# Patient Record
Sex: Male | Born: 1960
Health system: Southern US, Community
[De-identification: ages and names within clinical notes are randomized; demographics above are authoritative.]

## PROBLEM LIST (undated history)

## (undated) DIAGNOSIS — K219 Gastro-esophageal reflux disease without esophagitis: Secondary | ICD-10-CM

## (undated) DIAGNOSIS — R011 Cardiac murmur, unspecified: Secondary | ICD-10-CM

## (undated) DIAGNOSIS — E118 Type 2 diabetes mellitus with unspecified complications: Secondary | ICD-10-CM

## (undated) DIAGNOSIS — R0789 Other chest pain: Secondary | ICD-10-CM

## (undated) DIAGNOSIS — I2510 Atherosclerotic heart disease of native coronary artery without angina pectoris: Secondary | ICD-10-CM

## (undated) DIAGNOSIS — E669 Obesity, unspecified: Secondary | ICD-10-CM

## (undated) DIAGNOSIS — E119 Type 2 diabetes mellitus without complications: Secondary | ICD-10-CM

## (undated) DIAGNOSIS — E785 Hyperlipidemia, unspecified: Secondary | ICD-10-CM

## (undated) DIAGNOSIS — F418 Other specified anxiety disorders: Secondary | ICD-10-CM

## (undated) DIAGNOSIS — G4734 Idiopathic sleep related nonobstructive alveolar hypoventilation: Secondary | ICD-10-CM

## (undated) DIAGNOSIS — R0602 Shortness of breath: Secondary | ICD-10-CM

## (undated) DIAGNOSIS — F17200 Nicotine dependence, unspecified, uncomplicated: Secondary | ICD-10-CM

## (undated) DIAGNOSIS — I10 Essential (primary) hypertension: Secondary | ICD-10-CM

## (undated) DIAGNOSIS — I219 Acute myocardial infarction, unspecified: Secondary | ICD-10-CM

## (undated) HISTORY — PX: FINGER SURGERY: SHX640

## (undated) HISTORY — PX: HERNIA REPAIR: SHX51

## (undated) HISTORY — DX: Other chest pain: R07.89

## (undated) HISTORY — DX: Idiopathic sleep related nonobstructive alveolar hypoventilation: G47.34

## (undated) HISTORY — DX: Nicotine dependence, unspecified, uncomplicated: F17.200

## (undated) HISTORY — DX: Atherosclerotic heart disease of native coronary artery without angina pectoris: I25.10

## (undated) HISTORY — DX: Hyperlipidemia, unspecified: E78.5

## (undated) HISTORY — DX: Shortness of breath: R06.02

## (undated) HISTORY — DX: Obesity, unspecified: E66.9

## (undated) HISTORY — DX: Cardiac murmur, unspecified: R01.1

## (undated) HISTORY — DX: Gastro-esophageal reflux disease without esophagitis: K21.9

## (undated) HISTORY — DX: Other specified anxiety disorders: F41.8

## (undated) HISTORY — DX: Type 2 diabetes mellitus with unspecified complications: E11.8

## (undated) HISTORY — DX: Essential (primary) hypertension: I10

## (undated) HISTORY — PX: CHOLECYSTECTOMY: SHX55

## (undated) HISTORY — DX: Type 2 diabetes mellitus without complications: E11.9

---
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
## 2001-05-22 ENCOUNTER — Ambulatory Visit (HOSPITAL_COMMUNITY): Admission: RE | Admit: 2001-05-22 | Discharge: 2001-05-22 | Payer: Self-pay | Admitting: Family Medicine

## 2001-05-22 ENCOUNTER — Encounter: Payer: Self-pay | Admitting: Family Medicine

## 2003-06-28 ENCOUNTER — Observation Stay (HOSPITAL_COMMUNITY): Admission: EM | Admit: 2003-06-28 | Discharge: 2003-06-29 | Payer: Self-pay | Admitting: Anesthesiology

## 2003-06-29 ENCOUNTER — Encounter (INDEPENDENT_AMBULATORY_CARE_PROVIDER_SITE_OTHER): Payer: Self-pay | Admitting: Specialist

## 2004-08-20 HISTORY — PX: UPPER GASTROINTESTINAL ENDOSCOPY: SHX188

## 2004-08-20 HISTORY — PX: COLONOSCOPY: SHX174

## 2004-11-13 ENCOUNTER — Ambulatory Visit: Payer: Self-pay | Admitting: Gastroenterology

## 2004-12-05 ENCOUNTER — Ambulatory Visit: Payer: Self-pay | Admitting: Gastroenterology

## 2005-06-25 ENCOUNTER — Ambulatory Visit: Admission: RE | Admit: 2005-06-25 | Discharge: 2005-06-25 | Payer: Self-pay | Admitting: Family Medicine

## 2006-11-26 ENCOUNTER — Ambulatory Visit: Payer: Self-pay | Admitting: Cardiology

## 2006-11-27 ENCOUNTER — Ambulatory Visit: Payer: Self-pay

## 2006-12-10 ENCOUNTER — Inpatient Hospital Stay (HOSPITAL_COMMUNITY): Admission: EM | Admit: 2006-12-10 | Discharge: 2006-12-11 | Payer: Self-pay | Admitting: Emergency Medicine

## 2006-12-10 ENCOUNTER — Ambulatory Visit: Payer: Self-pay | Admitting: Internal Medicine

## 2007-01-22 ENCOUNTER — Ambulatory Visit: Payer: Self-pay | Admitting: Cardiology

## 2007-02-18 ENCOUNTER — Ambulatory Visit: Payer: Self-pay | Admitting: Gastroenterology

## 2007-03-04 ENCOUNTER — Ambulatory Visit: Payer: Self-pay | Admitting: Gastroenterology

## 2007-03-04 ENCOUNTER — Encounter: Payer: Self-pay | Admitting: Gastroenterology

## 2007-03-05 ENCOUNTER — Encounter: Payer: Self-pay | Admitting: Gastroenterology

## 2007-07-15 ENCOUNTER — Ambulatory Visit (HOSPITAL_COMMUNITY): Admission: EM | Admit: 2007-07-15 | Discharge: 2007-07-15 | Payer: Self-pay | Admitting: Emergency Medicine

## 2007-07-24 ENCOUNTER — Ambulatory Visit: Payer: Self-pay | Admitting: Surgery

## 2007-07-24 ENCOUNTER — Ambulatory Visit: Admission: RE | Admit: 2007-07-24 | Discharge: 2007-07-24 | Payer: Self-pay | Admitting: Family Medicine

## 2007-07-24 ENCOUNTER — Encounter: Payer: Self-pay | Admitting: Family Medicine

## 2007-12-12 DIAGNOSIS — E669 Obesity, unspecified: Secondary | ICD-10-CM | POA: Insufficient documentation

## 2007-12-12 DIAGNOSIS — K219 Gastro-esophageal reflux disease without esophagitis: Secondary | ICD-10-CM | POA: Insufficient documentation

## 2007-12-12 DIAGNOSIS — I2510 Atherosclerotic heart disease of native coronary artery without angina pectoris: Secondary | ICD-10-CM

## 2007-12-12 DIAGNOSIS — F341 Dysthymic disorder: Secondary | ICD-10-CM | POA: Insufficient documentation

## 2007-12-12 DIAGNOSIS — E785 Hyperlipidemia, unspecified: Secondary | ICD-10-CM

## 2007-12-12 DIAGNOSIS — E1169 Type 2 diabetes mellitus with other specified complication: Secondary | ICD-10-CM | POA: Insufficient documentation

## 2008-10-28 ENCOUNTER — Observation Stay (HOSPITAL_COMMUNITY): Admission: EM | Admit: 2008-10-28 | Discharge: 2008-10-29 | Payer: Self-pay | Admitting: Emergency Medicine

## 2008-10-28 ENCOUNTER — Ambulatory Visit (HOSPITAL_COMMUNITY): Admission: RE | Admit: 2008-10-28 | Discharge: 2008-10-28 | Payer: Self-pay | Admitting: Family Medicine

## 2008-10-29 ENCOUNTER — Encounter (INDEPENDENT_AMBULATORY_CARE_PROVIDER_SITE_OTHER): Payer: Self-pay | Admitting: General Surgery

## 2009-08-15 ENCOUNTER — Emergency Department (HOSPITAL_COMMUNITY): Admission: EM | Admit: 2009-08-15 | Discharge: 2009-08-15 | Payer: Self-pay | Admitting: Emergency Medicine

## 2009-09-12 ENCOUNTER — Encounter: Admission: RE | Admit: 2009-09-12 | Discharge: 2009-12-11 | Payer: Self-pay | Admitting: Neurosurgery

## 2009-10-19 ENCOUNTER — Ambulatory Visit: Payer: Self-pay | Admitting: Cardiology

## 2009-10-19 ENCOUNTER — Telehealth (INDEPENDENT_AMBULATORY_CARE_PROVIDER_SITE_OTHER): Payer: Self-pay | Admitting: *Deleted

## 2009-10-20 ENCOUNTER — Ambulatory Visit: Payer: Self-pay

## 2009-10-20 ENCOUNTER — Encounter (HOSPITAL_COMMUNITY): Admission: RE | Admit: 2009-10-20 | Discharge: 2009-12-20 | Payer: Self-pay | Admitting: Cardiology

## 2009-10-20 ENCOUNTER — Ambulatory Visit: Payer: Self-pay | Admitting: Internal Medicine

## 2010-03-07 ENCOUNTER — Telehealth (INDEPENDENT_AMBULATORY_CARE_PROVIDER_SITE_OTHER): Payer: Self-pay

## 2010-06-20 ENCOUNTER — Encounter: Payer: Self-pay | Admitting: Cardiology

## 2010-06-20 HISTORY — PX: VENTRAL HERNIA REPAIR: SHX424

## 2010-06-30 ENCOUNTER — Telehealth (INDEPENDENT_AMBULATORY_CARE_PROVIDER_SITE_OTHER): Payer: Self-pay | Admitting: *Deleted

## 2010-07-04 ENCOUNTER — Ambulatory Visit (HOSPITAL_COMMUNITY): Admission: RE | Admit: 2010-07-04 | Discharge: 2010-07-05 | Payer: Self-pay | Admitting: General Surgery

## 2010-07-10 ENCOUNTER — Encounter: Payer: Self-pay | Admitting: Cardiology

## 2010-07-18 ENCOUNTER — Ambulatory Visit: Payer: Self-pay | Admitting: Cardiology

## 2010-07-18 ENCOUNTER — Ambulatory Visit: Payer: Self-pay | Admitting: Critical Care Medicine

## 2010-07-18 ENCOUNTER — Encounter: Payer: Self-pay | Admitting: Cardiology

## 2010-07-18 ENCOUNTER — Inpatient Hospital Stay (HOSPITAL_COMMUNITY)
Admission: AD | Admit: 2010-07-18 | Discharge: 2010-07-20 | Payer: Self-pay | Source: Home / Self Care | Admitting: Cardiology

## 2010-07-18 DIAGNOSIS — E1159 Type 2 diabetes mellitus with other circulatory complications: Secondary | ICD-10-CM

## 2010-07-18 DIAGNOSIS — I10 Essential (primary) hypertension: Secondary | ICD-10-CM | POA: Insufficient documentation

## 2010-07-19 ENCOUNTER — Encounter: Payer: Self-pay | Admitting: Cardiology

## 2010-07-20 ENCOUNTER — Encounter: Payer: Self-pay | Admitting: Cardiology

## 2010-07-28 ENCOUNTER — Encounter (INDEPENDENT_AMBULATORY_CARE_PROVIDER_SITE_OTHER): Payer: Self-pay | Admitting: *Deleted

## 2010-07-31 ENCOUNTER — Encounter (INDEPENDENT_AMBULATORY_CARE_PROVIDER_SITE_OTHER): Payer: Self-pay

## 2010-07-31 ENCOUNTER — Telehealth: Payer: Self-pay | Admitting: Gastroenterology

## 2010-08-03 ENCOUNTER — Ambulatory Visit: Payer: Self-pay | Admitting: Critical Care Medicine

## 2010-08-03 DIAGNOSIS — J45909 Unspecified asthma, uncomplicated: Secondary | ICD-10-CM | POA: Insufficient documentation

## 2010-08-10 ENCOUNTER — Encounter: Payer: Self-pay | Admitting: Cardiology

## 2010-08-15 ENCOUNTER — Encounter: Payer: Self-pay | Admitting: Cardiology

## 2010-08-15 ENCOUNTER — Ambulatory Visit: Payer: Self-pay | Admitting: Cardiology

## 2010-08-17 ENCOUNTER — Ambulatory Visit
Admission: RE | Admit: 2010-08-17 | Discharge: 2010-08-17 | Payer: Self-pay | Source: Home / Self Care | Attending: Family Medicine | Admitting: Family Medicine

## 2010-08-17 ENCOUNTER — Encounter: Payer: Self-pay | Admitting: Critical Care Medicine

## 2010-08-22 ENCOUNTER — Ambulatory Visit: Admit: 2010-08-22 | Payer: Self-pay | Admitting: Gastroenterology

## 2010-08-24 ENCOUNTER — Encounter: Payer: Self-pay | Admitting: Critical Care Medicine

## 2010-08-30 ENCOUNTER — Encounter: Payer: Self-pay | Admitting: Critical Care Medicine

## 2010-08-31 ENCOUNTER — Telehealth: Payer: Self-pay | Admitting: Critical Care Medicine

## 2010-09-07 ENCOUNTER — Ambulatory Visit
Admission: RE | Admit: 2010-09-07 | Discharge: 2010-09-07 | Payer: Self-pay | Source: Home / Self Care | Attending: Critical Care Medicine | Admitting: Critical Care Medicine

## 2010-09-11 ENCOUNTER — Encounter: Payer: Self-pay | Admitting: Family Medicine

## 2010-09-19 NOTE — Progress Notes (Signed)
Summary: Faxed form to [facility] Health Patient Accounting  Phone Note Outgoing Call Call back at [phone] [facility]   Call placed by: Irean Hong, RN,  March 07, 2010 11:18 AM Summary of Call: Bonita Quin from [facility] patient accounting requested Outpatient Coinsurance Notice form signed by patient on 10/20/09.Form faxed to linda. Lauretta Sallas,RN.

## 2010-09-19 NOTE — Progress Notes (Signed)
Summary: Records Request   Faxed OV, EKG & Stress to Darlene at [facility] ([phone]). Debby Freiberg  June 30, 2010 12:00 PM

## 2010-09-19 NOTE — Letter (Signed)
Summary: New Patient letter  [facility]  [address] [place], [place] [address]   Phone: [phone]  Fax: [phone]       07/28/2010 MRN: [id number]  David Black [address] [place], [place]  [address]  Dear Mr. David Black,  Welcome to the Gastroenterology Division at [facility].    You are scheduled to see Dr.  Arlyce Dice on 09-08-10 at 10:45a.m. on the 3rd floor at [facility], [address].  We ask that you try to arrive at our office 15 minutes prior to your appointment time to allow for check-in.  We would like you to complete the enclosed self-administered evaluation form prior to your visit and bring it with you on the day of your appointment.  We will review it with you.  Also, please bring a complete list of all your medications or, if you prefer, bring the medication bottles and we will list them.  Please bring your insurance card so that we may make a copy of it.  If your insurance requires a referral to see a specialist, please bring your referral form from your primary care physician.  Co-payments are due at the time of your visit and may be paid by cash, check or credit card.     Your office visit will consist of a consult with your physician (includes a physical exam), any laboratory testing he/she may order, scheduling of any necessary diagnostic testing (e.g. x-ray, ultrasound, CT-scan), and scheduling of a procedure (e.g. Endoscopy, Colonoscopy) if required.  Please allow enough time on your schedule to allow for any/all of these possibilities.    If you cannot keep your appointment, please call [phone] to cancel or reschedule prior to your appointment date.  This allows [place] the opportunity to schedule an appointment for another patient in need of care.  If you do not cancel or reschedule by 5 p.m. the business day prior to your appointment date, you will be charged a $50.00 late cancellation/no-show fee.    Thank you for choosing [facility] for your medical needs.  We appreciate the opportunity to care for you.  Please visit [place] at our website  to learn more about our practice.                     Sincerely,                                                             The Gastroenterology Division

## 2010-09-19 NOTE — Consult Note (Signed)
Summary: [place]  [facility]   Imported By: Sherian Rein 07/21/2010 12:31:43  _____________________________________________________________________  External Attachment:    Type:   Image     Comment:   External Document

## 2010-09-19 NOTE — Progress Notes (Signed)
Summary: Nuclear pre procedure  Phone Note Outgoing Call Call back at [facility] Phone [phone]   Call placed by: Rea College, CMA,  October 19, 2009 5:06 PM Call placed to: Patient Summary of Call: Left message with information on Myoview Information Sheet (see scanned document for details).      Nuclear Med Background Indications for Stress Test: Evaluation for Ischemia   History: Heart Catheterization, Myocardial Perfusion Study  History Comments: '04 Cath:n/o CAD, EF=55%; 4/08 JYN:[id number], EF=52%  Symptoms: Chest Tightness    Nuclear Pre-Procedure Cardiac Risk Factors: Family History - CAD, History of Smoking, Hypertension, Lipids, Obesity Height (in): 69

## 2010-09-19 NOTE — Assessment & Plan Note (Signed)
Summary: DOD ADD-ON  Medications Added PANTOPRAZOLE SODIUM 40 MG TBEC (PANTOPRAZOLE SODIUM) once daily      Allergies Added: ! SULFA  Visit Type:  Add On Visit / Hospital Admission Primary Provider:  [name], MD  CC:  shortness of breath.  History of Present Illness: The patient has mild coronary artery disease by catheterization in 2004.  He had a nuclear study in 2006 with no ischemia.  He saw Dr. Antoine Poche in the office in March, 2011.  He had a nuclear stress study the following day.  That study showed normal left ventricular function and no ischemia.  The patient recently had an abdominal incisional hernia repair.  The area is just above his umbilicus.  The procedure went well.  It was noted postoperatively the patient did have drop in O2 sats.  At one point his  pulse ox was 69.  He wore a device at home and his home O2 sat dropped as low as 89%.  He was scheduled to see Dr. Delford Field for outpatient pulmonary evaluation.  The patient saw Dr. Christell Constant today.  He mentioned that he was having exertional shortness of breath.  He also had an episode of exertional chest tightness with indigestion.  The patient does have a family history of coronary disease.  He has significant dyslipidemia.  In 2004 catheterization revealed 15% proximal stenosis of the LAD with 30% mid stenosis.  There was 30% diagonal stenosis.  There was 30% circumflex stenosis and 30% right coronary artery stenosis.  There is a history of normal LV function with an ejection fraction of at least 55%.  Because of dropping O2 sats and chest discomfort and known prior coronary disease decision was made for him to be seen for full assessment today in the office.  He is comfortable at this time.  Current Medications (verified): 1)  Aspirin 81 Mg  Tabs (Aspirin) .Marland Kitchen.. 1 By Mouth Daily 2)  Lovaza 1 Gm Caps (Omega-3-Acid Ethyl Esters) .... 2 By Mouth Daily 3)  Pantoprazole Sodium 40 Mg Tbec (Pantoprazole Sodium) .... Once Daily 4)   Trilipix 135 Mg Cpdr (Choline Fenofibrate) .Marland Kitchen.. 1 By Mouth Daily 5)  Zetia 10 Mg Tabs (Ezetimibe) .Marland Kitchen.. 1 By Mouth Dialy 6)  Diovan Hct 160-12.5 Mg Tabs (Valsartan-Hydrochlorothiazide) .Marland Kitchen.. 1 By Mouth Daily 7)  Lexapro 10 Mg Tabs (Escitalopram Oxalate) .Marland Kitchen.. 1 By Mouth Daily 8)  Crestor 5 Mg Tabs (Rosuvastatin Calcium) .... 1/2 By Mouth Every Other Day  Allergies (verified): 1)  ! * Niaspan 2)  ! Advicor (Niacin-Lovastatin) 3)  ! Vioxx 4)  ! * Bextra 5)  ! Wellbutrin 6)  ! Bactrim 7)  ! Sulfa  Past History:  Past Medical History: GERD (ICD-530.81) OBESITY, UNSPECIFIED (ICD-278.00) CAD (ICD-414.00) (catheterization November 2004, left main normal, LAD 10-15% proximal, 30% mid stenosis, diagonal 30% stenosis, circumflex 20- 30% stenosis, right coronary artery 30% stenosis, EF 55%).  /  nuclear 2006.... no ischemia /  nuclear march, 2011... no ischemia Ejection fraction   normal by history DEPRESSION/ANXIETY (ICD-300.4) DYSLIPIDEMIA (ICD-272.4) x years HTN x 10 years Smoked (x 20 years 1 1/2 ppd, quit 7 years ago) CAD (first degree relative early) Incisional hernia repair.... November, 2011 Decreased O2 sats    November, 2011 Shortness of breath and chest discomfort with exertion  July 18, 2010  Family History: There is a strong family history of coronary artery disease.  Social History: The patient is married.  He stopped smoking 7 years ago.  Review of Systems  Patient denies fever, chills, headache, sweats, rash, change in vision, change in hearing, cough, nausea vomiting, urinary symptoms.  All of the systems are reviewed and are negative.  Vital Signs:  Patient profile:   50 year old male Height:      69 inches Weight:      275 pounds BMI:     40.76 Pulse rate:   75 / minute BP sitting:   138 / 82  (left arm) Cuff size:   large  Vitals Entered By: Hardin Negus, RMA (July 18, 2010 3:06 PM)  Physical Exam  General:  The patient is overweight but  quite stable. Head:  head is atraumatic. Eyes:  no xanthelasma. Neck:  no jugular venous distention. Chest Wall:  no chest wall tenderness. Lungs:  lungs are clear.  Respiratory effort is nonlabored. Heart:  cardiac exam reveals S1-S2.  No clicks or significant murmurs. Abdomen:  abdomen is soft. The incisional hernia repair site looks quite stable. Msk:  no musculoskeletal deformities. Extremities:  no peripheral edema. Skin:  no skin rashes. Psych:  patient is oriented to person time and place.  Affect is normal.   Impression & Recommendations:  Problem # 1:  * SHORTNESS OF BREATH WITH EXERTION, CHEST PAIN, INDIGESTION. The patient has noted these symptoms in the past few days.  He has known mild coronary disease by catheter in 2004.  He had no ischemia on a nuclear scan in March, 2011.  I have reviewed EKG from Dr. Kathi Der office.  There is no significant abnormality.  At this point we have to consider the possibility of ischemia and rule out pulmonary emboli.  Patient will be admitted and started on heparin.  Chest CT will be done to rule out pulmonary emboli.  Patient will be put on the cardiac catheterization schedule for November 30.  He will have the catheterization  if his renal function is stable and if there are no pulmonary emboli. We will also consult pulmonary medicine as the original plan had been to ask for their opinion as an outpatient about his decreased O2 sats  Problem # 2:  * DECREASED O2 SATS Etiology is not clear.  It is possible that he may have had some of this before his hernia repair.  However we need to be sure that he has not had pulmonary emboli.  Problem # 3:  * INCISIONAL HERNIA REPAIR The patient's incisional hernia repair site appears to be stable.  No further workup.  Problem # 4:  HYPERTENSION (ICD-401.9)  His updated medication list for this problem includes:    Aspirin 81 Mg Tabs (Aspirin) .Marland Kitchen... 1 by mouth daily    Diovan Hct 160-12.5 Mg Tabs  (Valsartan-hydrochlorothiazide) .Marland Kitchen... 1 by mouth daily Hypertension is controlled today.  No change in therapy.  Problem # 5:  CAD (ICD-414.00)  His updated medication list for this problem includes:    Aspirin 81 Mg Tabs (Aspirin) .Marland Kitchen... 1 by mouth daily The patient has mild documented coronary disease from 2004.  With his current symptoms he needs repeat catheterization to be sure that this has not progressed.  Problem # 6:  DYSLIPIDEMIA (ICD-272.4)  His updated medication list for this problem includes:    Lovaza 1 Gm Caps (Omega-3-acid ethyl esters) .Marland Kitchen... 2 by mouth daily    Trilipix 135 Mg Cpdr (Choline fenofibrate) .Marland Kitchen... 1 by mouth daily    Zetia 10 Mg Tabs (Ezetimibe) .Marland Kitchen... 1 by mouth dialy    Crestor 5 Mg Tabs (Rosuvastatin calcium) .Marland KitchenMarland KitchenMarland KitchenMarland Kitchen  1/2 by mouth every other day The patient's lipids are very carefully treated.

## 2010-09-19 NOTE — Letter (Signed)
Summary: [employer]patience Coinsurance Notice  Outpatience Coinsurance Notice   Imported By: Marylou Mccoy 01/19/2010 14:00:47  _____________________________________________________________________  External Attachment:    Type:   Image     Comment:   External Document

## 2010-09-19 NOTE — Assessment & Plan Note (Signed)
Summary: Cardiology Nuclear Study  Nuclear Med Background Indications for Stress Test: Evaluation for Ischemia   History: Heart Catheterization, Myocardial Perfusion Study  History Comments: '04 Cath:n/o CAD, EF=55%; 4/08 RUE:[id number], EF=52%  Symptoms: Chest Tightness  Symptoms Comments: Last episode of JX:BJYN night.   Nuclear Pre-Procedure Cardiac Risk Factors: Family History - CAD, History of Smoking, Hypertension, Lipids, Obesity Caffeine/Decaff Intake: None NPO After: 10:00 PM Lungs: Clear IV 0.9% NS with Angio Cath: 22g     IV Site: (L) AC IV Started by: Irean Hong RN Chest Size (in) 50     Height (in): 69 Weight (lb): 272 BMI: 40.31  Nuclear Med Study 1 or 2 day study:  1 day     Stress Test Type:  Stress Reading MD:  Dietrich Pates, MD     Referring MD:  Rollene Rotunda, MD Resting Radionuclide:  Technetium 79m Tetrofosmin     Resting Radionuclide Dose:  11.0 mCi  Stress Radionuclide:  Technetium 52m Tetrofosmin     Stress Radionuclide Dose:  33.0 mCi   Stress Protocol Exercise Time (min):  10:30 min     Max HR:  157 bpm     Predicted Max HR:  174 bpm  Max Systolic BP: 181 mm Hg     Percent Max HR:  90.23 %     METS: 11.7 Rate Pressure Product:  82956    Stress Test Technologist:  Rea College CMA-N     Nuclear Technologist:  Burna Mortimer Deal RT-N  Rest Procedure  Myocardial perfusion imaging was performed at rest 45 minutes following the intravenous administration of Myoview Technetium 59m Tetrofosmin.  Stress Procedure  The patient exercised for 10:30.  The patient stopped due to fatigue and denied any chest pain.  There were no significant ST-T wave changes, only an isolated PVC.  Myoview was injected at peak exercise and myocardial perfusion imaging was performed after a brief delay.  QPS Raw Data Images:  Extensive soft tissue (diaphragm, subcutaneous fat) surround heart. Stress Images:  Minimal thinning in the inferior base (in the vertical images only).   Otherwise normal perfusion. Rest Images:  No signficiant change from the stress images. Subtraction (SDS):  No evidence of ischemia. Transient Ischemic Dilatation:  .81  (Normal <1.22)  Lung/Heart Ratio:  .36  (Normal <0.45)  Quantitative Gated Spect Images QGS EDV:  110 ml QGS ESV:  45 ml QGS EF:  59 %   Overall Impression  Exercise Capacity: Excellent exercise capacity. BP Response: Normal blood pressure response. Clinical Symptoms: No chest pain ECG Impression: No significant ST segment change suggestive of ischemia. Overall Impression: Normal stress nuclear study.  Appended Document: Cardiology Nuclear Study Negative.  No further cardiac work up.  Appended Document: Cardiology Nuclear Study pt aware of results

## 2010-09-19 NOTE — Assessment & Plan Note (Signed)
Summary: [facility]  Medications Added ASPIRIN 81 MG  TABS (ASPIRIN) 1 by mouth daily LOVAZA 1 GM CAPS (OMEGA-3-ACID ETHYL ESTERS) 2 by mouth daily ACIPHEX 20 MG TBEC (RABEPRAZOLE SODIUM) 1 by mouth daily TRILIPIX 135 MG CPDR (CHOLINE FENOFIBRATE) 1 by mouth daily ZETIA 10 MG TABS (EZETIMIBE) 1 by mouth dialy DIOVAN HCT 160-12.5 MG TABS (VALSARTAN-HYDROCHLOROTHIAZIDE) 1 by mouth daily LEXAPRO 10 MG TABS (ESCITALOPRAM OXALATE) 1 by mouth daily CRESTOR 5 MG TABS (ROSUVASTATIN CALCIUM) 1/2 by mouth every other day      Allergies Added: ! * NIASPAN ! ADVICOR [employer]) ! VIOXX ! * BEXTRA ! WELLBUTRIN ! BACTRIM  Visit Type:  Follow-up Primary Provider:  Dr. [name]  CC:  chest pain.  History of Present Illness: The patient presents as an add-on today for evaluation of chest pain. He has a history of nonobstructive coronary disease. This morning he awoke with some mid to left-sided sternal discomfort that he described as a tightness. It was constant. It was not like previous discomfort he had at the time of his catheterization in 2004. There was no radiation to his jaw or to his arms. He took aspirin Advil and his morning medicines but had persistent discomfort throughout the morning. He saw Dr. Christell Constant and his EKG was normal. He took a nap this afternoon and it seems to have improved the discomfort. He's had no associated symptoms such as nausea vomiting or diaphoresis. He has not had palpitations, presyncope or syncope. He had been playing some softball recently without bringing on any of these symptoms. He has started doing some physical therapy and weight lifting for some back problems. This is a new activity.  Current Medications (verified): 1)  Aspirin 81 Mg  Tabs (Aspirin) .Marland Kitchen.. 1 By Mouth Daily 2)  Lovaza 1 Gm Caps (Omega-3-Acid Ethyl Esters) .... 2 By Mouth Daily 3)  Aciphex 20 Mg Tbec (Rabeprazole Sodium) .Marland Kitchen.. 1 By Mouth Daily 4)  Trilipix 135 Mg Cpdr (Choline  Fenofibrate) .Marland Kitchen.. 1 By Mouth Daily 5)  Zetia 10 Mg Tabs (Ezetimibe) .Marland Kitchen.. 1 By Mouth Dialy 6)  Diovan Hct 160-12.5 Mg Tabs (Valsartan-Hydrochlorothiazide) .Marland Kitchen.. 1 By Mouth Daily 7)  Lexapro 10 Mg Tabs (Escitalopram Oxalate) .Marland Kitchen.. 1 By Mouth Daily 8)  Crestor 5 Mg Tabs (Rosuvastatin Calcium) .... 1/2 By Mouth Every Other Day  Allergies (verified): 1)  ! * Niaspan 2)  ! Advicor (Niacin-Lovastatin) 3)  ! Vioxx 4)  ! * Bextra 5)  ! Wellbutrin 6)  ! Bactrim  Past History:  Past Medical History: GERD (ICD-530.81) OBESITY, UNSPECIFIED (ICD-278.00) CAD (ICD-414.00) (catheterization November 2004, left main normal, LAD 10-15% proximal, 30% mid stenosis, diagonal 30% stenosis, circumflex 20- 30% stenosis, right coronary artery 30% stenosis, EF 55%).  DEPRESSION/ANXIETY (ICD-300.4) DYSLIPIDEMIA (ICD-272.4) x years HTN x 10 years Smoked (x 20 years 1 1/2 ppd, quit 7 years ago) CAD (first degree relative early)  Review of Systems       As stated in the HPI and negative for all other systems.   Vital Signs:  Patient profile:   50 year old male Height:      69 inches Weight:      277 pounds BMI:     41.05 Pulse rate:   66 / minute Resp:     18 per minute BP sitting:   120 / 84  (right arm)  Vitals Entered By: Marrion Coy, CNA (October 19, 2009 2:36 PM)  Physical Exam  General:  Well developed, well nourished,  in no acute distress. Head:  normocephalic and atraumatic Eyes:  PERRLA/EOM intact; conjunctiva and lids normal. Mouth:  Teeth, gums and palate normal. Oral mucosa normal. Neck:  Neck supple, no JVD. No masses, thyromegaly or abnormal cervical nodes. Chest Wall:  no deformities or breast masses noted Lungs:  Clear bilaterally to auscultation and percussion. Abdomen:  Bowel sounds positive; abdomen soft and non-tender without masses, organomegaly, or hernias noted. No hepatosplenomegaly, obese Msk:  Back normal, normal gait. Muscle strength and tone normal. Extremities:  No  clubbing or cyanosis. Neurologic:  Alert and oriented x 3. Skin:  Intact without lesions or rashes. Cervical Nodes:  no significant adenopathy Axillary Nodes:  no significant adenopathy Inguinal Nodes:  no significant adenopathy Psych:  Normal affect.   Detailed Cardiovascular Exam  Neck    Carotids: Carotids full and equal bilaterally without bruits.      Neck Veins: Normal, no JVD.    Heart    Inspection: no deformities or lifts noted.      Palpation: normal PMI with no thrills palpable.      Auscultation: regular rate and rhythm, S1, S2 without murmurs, rubs, gallops, or clicks.    Vascular    Abdominal Aorta: no palpable masses, pulsations, or audible bruits.      Femoral Pulses: normal femoral pulses bilaterally.      Pedal Pulses: normal pedal pulses bilaterally.      Radial Pulses: normal radial pulses bilaterally.      Peripheral Circulation: no clubbing, cyanosis, or edema noted with normal capillary refill.     EKG  Procedure date:  10/19/2009  Findings:      sinus rhythm, rate 68, axis within normal limits, intervals within normal limits, no acute ST-T wave changes.  Impression & Recommendations:  Problem # 1:  CHEST PAIN (ICD-786.50)  The patient has symptoms that have typical and atypical characteristics indicating an etiology possibly related to coronary disease. He has known nonobstructive disease on catheterization several years ago. He has a strong family history and ongoing risk factors. I think the pretest probability for obstructive disease as the etiology is moderate. Therefore, stress perfusion testing is indicated. I have discussed this with him.  I will give him sublingual nitroglycerin and instructions to present to the emergency room should he have any recurrent discomfort this evening. Otherwise will have a stress perfusion study done tomorrow. Regardless of the results he needs risk reduction.  His updated medication list for this problem  includes:    Aspirin 81 Mg Tabs (Aspirin) .Marland Kitchen... 1 by mouth daily  Problem # 2:  OBESITY, UNSPECIFIED (ICD-278.00) He and I discussed the need to lose weight with diet and exercise.  Problem # 3:  DYSLIPIDEMIA (ICD-272.4)  I reviewed his lipid profile done at his primary care office. His HDL was 37 and LDL 90 in August of last year. He is advised on diet. He is being aggressively managed with combination therapy.  His updated medication list for this problem includes:    Lovaza 1 Gm Caps (Omega-3-acid ethyl esters) .Marland Kitchen... 2 by mouth daily    Trilipix 135 Mg Cpdr (Choline fenofibrate) .Marland Kitchen... 1 by mouth daily    Zetia 10 Mg Tabs (Ezetimibe) .Marland Kitchen... 1 by mouth dialy    Crestor 5 Mg Tabs (Rosuvastatin calcium) .Marland Kitchen... 1/2 by mouth every other day  Other Orders: Nuclear Stress Test (Nuc Stress Test)  Patient Instructions: 1)  Your physician recommends that you schedule a follow-up appointment after  your testing 2)  Your physician recommends that you continue on your current medications as directed. Please refer to the Current Medication list given to you today. 3)  Your physician has requested that you have an exercise stress myoview.  For further information please visit [email or web address].  Please follow instruction sheet, as given.

## 2010-09-21 NOTE — Assessment & Plan Note (Signed)
Summary: Pulmonary OV   Primary Provider/Referring Provider:  [name], MD  CC:  HFU.  Pt states he's had a couple episodes of SOB when doing activities but overall believes  breathing is "normal."  Chest tightness at times.  Denies wheezing and cough.  .  History of Present Illness: Pulmonary Post hosp f/u  This pt was in  [facility] 11/29--12/1.  Pt admitted for dyspnea. Studies showed: PFTs obstruction in small airways  Pt has CAD only mild on cath.  Pt had  RHC that showed  no PULM HTN> No shunt seen.   A TEE showed  no PFO or ASD CT chest neg for PE and DVT.    Pt notes some BP meds held  so may be lightheaded from meds. Pt notes had crestor/zetia/trilipix/lovaza all stopped after hernia surgery due to elevated LFTs  Pt notes since meds adjusted still with occasional lightheadedness The dyspnea is improved and was d/c on advair and dyspnea is better.   No cough, no chest pain. No wheeze noted.  Preventive Screening-Counseling & Management  Alcohol-Tobacco     Smoking Status: never  Current Medications (verified): 1)  Aspirin 81 Mg  Tabs (Aspirin) .Marland Kitchen.. 1 By Mouth Daily 2)  Dexilant 60 Mg Cpdr (Dexlansoprazole) .... Take 1 Capsule By Mouth Two Times A Day 3)  Diovan Hct 160-12.5 Mg Tabs (Valsartan-Hydrochlorothiazide) .... 1/2 By Mouth Daily 4)  Lexapro 10 Mg Tabs (Escitalopram Oxalate) .Marland Kitchen.. 1 By Mouth Daily 5)  Vitamin D (Ergocalciferol) 50000 Unit Caps (Ergocalciferol) .... Once Weekly 6)  Advair Diskus 250-50 Mcg/dose Aepb (Fluticasone-Salmeterol) .Marland Kitchen.. 1 Puff Once Daily 7)  Fluticasone Propionate 50 Mcg/act Susp (Fluticasone Propionate) .... 2 Sprays Each Nosrtil Once Daily  Allergies (verified): 1)  ! * Niaspan 2)  ! Advicor (Niacin-Lovastatin) 3)  ! Vioxx 4)  ! * Bextra 5)  ! Wellbutrin 6)  ! Bactrim 7)  ! Sulfa  Past History:  Past medical, surgical, family and social histories (including risk factors) reviewed, and no changes noted (except as noted  below).  Past Medical History: Reviewed history from 07/18/2010 and no changes required. GERD (ICD-530.81) OBESITY, UNSPECIFIED (ICD-278.00) CAD (ICD-414.00) (catheterization November 2004, left main normal, LAD 10-15% proximal, 30% mid stenosis, diagonal 30% stenosis, circumflex 20- 30% stenosis, right coronary artery 30% stenosis, EF 55%).  /  nuclear 2006.... no ischemia /  nuclear march, 2011... no ischemia Ejection fraction   normal by history DEPRESSION/ANXIETY (ICD-300.4) DYSLIPIDEMIA (ICD-272.4) x years HTN x 10 years Smoked (x 20 years 1 1/2 ppd, quit 7 years ago) CAD (first degree relative early) Incisional hernia repair.... November, 2011 Decreased O2 sats    November, 2011 Shortness of breath and chest discomfort with exertion  July 18, 2010  Past Surgical History: hernia repair x 3 Cholecystectomy  Family History: Reviewed history from 07/18/2010 and no changes required. There is a strong family history of coronary artery disease. Paternal aunts - breast ca Paternal uncle - testicular ca, heart disease Father - heart disease  Social History: Reviewed history from 07/18/2010 and no changes required. The patient is married.   Former Smoker.  Quit in 2004.  Up to 1 1/2 ppd x 20 yrs 1 daughter works in Insurance account manager Smoking Status:  never  Review of Systems       The patient complains of shortness of breath with activity.  The patient denies shortness of breath at rest, productive cough, non-productive cough, coughing up blood, chest pain, irregular heartbeats, acid heartburn, indigestion, loss of appetite,  weight change, abdominal pain, difficulty swallowing, sore throat, tooth/dental problems, headaches, nasal congestion/difficulty breathing through nose, sneezing, itching, ear ache, anxiety, depression, hand/feet swelling, joint stiffness or pain, rash, change in color of mucus, and fever.    Vital Signs:  Patient profile:   50 year old male Height:      69  inches Weight:      273 pounds BMI:     40.46 O2 Sat:      95 % on Room air Temp:     98.2 degrees F oral Pulse rate:   68 / minute BP sitting:   120 / 76  (right arm) Cuff size:   large  Vitals Entered By: Gweneth Dimitri RN (August 03, 2010 11:07 AM)  O2 Flow:  Room air CC: HFU.  Pt states he's had a couple episodes of SOB when doing activities but overall believes  breathing is "normal."  Chest tightness at times.  Denies wheezing and cough.   Comments Medications reviewed with patient Daytime contact number verified with patient. Gweneth Dimitri RN  August 03, 2010 11:08 AM    Physical Exam  Additional Exam:  Gen: Pleasant, well-nourished, in no distress,  normal affect ENT: No lesions,  mouth clear,  oropharynx clear, no postnasal drip Neck: No JVD, no TMG, no carotid bruits Lungs: No use of accessory muscles, no dullness to percussion, distant BS, no wheeze Cardiovascular: RRR, heart sounds normal, no murmur or gallops, no peripheral edema Abdomen: soft and NT, no HSM,  BS normal Musculoskeletal: No deformities, no cyanosis or clubbing Neuro: alert, non focal Skin: Warm, no lesions or rashes    Impression & Recommendations:  Problem # 1:  SLEEP APNEA (ICD-780.57) Assessment Unchanged symptom complex consistent with OSA plan schedule Sleep study Orders: Est. Patient Level V ([address]) Sleep Disorder Referral (Sleep Disorder)  Problem # 2:  EXTRINSIC ASTHMA, UNSPECIFIED (ICD-493.00) Assessment: Improved improved airflow obstruction with asthma, no desat on ambulation noted plan cotn advair  Medications Added to Medication List This Visit: 1)  Dexilant 60 Mg Cpdr (Dexlansoprazole) .... Take 1 capsule by mouth two times a day 2)  Diovan Hct 160-12.5 Mg Tabs (Valsartan-hydrochlorothiazide) .... 1/2 by mouth daily 3)  Diovan Hct 320-25 Mg Tabs (Valsartan-hydrochlorothiazide) .... 1/4 by mouth daily 4)  Vitamin D (ergocalciferol) 50000 Unit Caps (Ergocalciferol) ....  Once weekly 5)  Advair Diskus 250-50 Mcg/dose Aepb (Fluticasone-salmeterol) .Marland Kitchen.. 1 puff once daily 6)  Fluticasone Propionate 50 Mcg/act Susp (Fluticasone propionate) .... 2 sprays each nosrtil once daily  Complete Medication List: 1)  Aspirin 81 Mg Tabs (Aspirin) .Marland Kitchen.. 1 by mouth daily 2)  Dexilant 60 Mg Cpdr (Dexlansoprazole) .... Take 1 capsule by mouth two times a day 3)  Diovan Hct 320-25 Mg Tabs (Valsartan-hydrochlorothiazide) .... 1/4 by mouth daily 4)  Lexapro 10 Mg Tabs (Escitalopram oxalate) .Marland Kitchen.. 1 by mouth daily 5)  Vitamin D (ergocalciferol) 50000 Unit Caps (Ergocalciferol) .... Once weekly 6)  Advair Diskus 250-50 Mcg/dose Aepb (Fluticasone-salmeterol) .Marland Kitchen.. 1 puff once daily 7)  Fluticasone Propionate 50 Mcg/act Susp (Fluticasone propionate) .... 2 sprays each nosrtil once daily  Other Orders: Pulse Oximetry, Ambulatory (60454)  Patient Instructions: 1)  Stay on advair 2)  A sleep study will be scheduled 3)  Return 2 months  .Ambulatory Pulse Oximetry  Resting; HR___76__    02 Sat_95____  Lap1 (185 feet)   HR__99___   02 Sat__93___ Lap2 (185 feet)   HR_101____   02 Sat_92____    Lap3 (185 feet)  HR___104__   02 Sat__93___  _x__Test Completed without Difficulty  .Ambulatory Pulse Oximetry .Kandice Hams [facility]  August 03, 2010 11:36 AM        Immunization History:  Influenza Immunization History:    Influenza:  historical (05/20/2010)   Appended Document: Pulmonary OV fax don Christell Constant

## 2010-09-21 NOTE — Progress Notes (Signed)
Summary: Sooner appointment   ---- Converted from flag ---- ---- 07/31/2010 9:57 AM, Louis Meckel MD wrote: yes  ---- 07/31/2010 9:51 AM, Merri Ray CMA (AAMA) wrote: He has an New Pt  appoitnment scheduled for 1/20th does he need to be worked in??  ---- 07/31/2010 9:50 AM, Louis Meckel MD wrote: He needs an OV  ---- 07/28/2010 10:22 AM, Karna Christmas wrote: Dr. Rudi Heap [phone]...would like to discuss this mutual pt. ------------------------------  Spoke with patient's wife and gave her the appointment with Dr. Arlyce Dice for 08/22/10 at 2pm. Letter mailed to patient. Selinda Michaels RN  July 31, 2010 10:29 AM

## 2010-09-21 NOTE — Assessment & Plan Note (Signed)
Summary: EPH PER MATT/LG  Medications Added FLUTICASONE PROPIONATE 50 MCG/ACT SUSP (FLUTICASONE PROPIONATE) 2 sprays each nosrtil once daily as needed      Allergies Added:   Visit Type:  Follow-up Primary David Black:  David Prey, MD  CC:  Chest pain and Dyspnea.  History of Present Illness: The patient presents for followup of chest discomfort and shortness of breath. He had an extensive workup recently in the hospital. Catheterization confirmed minimal coronary plaque. Right heart catheterization demonstrated no significant abnormalities. Transthoracic echo demonstrated a normal ejection fraction and no significant valvular abnormalities. CT rule out pulmonary emboli. He has been seen by Dr. Delford Field and has some very mild abnormalities in PFTs and is apparently being treated for some mild asthma. He also is to be evaluated with a sleep study. Since going home he had one episode of chest discomfort after standing for 25 minutes. His blood pressure was slightly low and his meds were reduced. Since then he has had no significant episodes and is starting to get back to activities. He had not yet started the exercise.  He denies any chest discomfort, neck or arm discomfort. He denies any palpitations, presyncope or syncope. He has had no PND or orthopnea.  Current Medications (verified): 1)  Aspirin 81 Mg  Tabs (Aspirin) .Marland Kitchen.. 1 By Mouth Daily 2)  Dexilant 60 Mg Cpdr (Dexlansoprazole) .... Take 1 Capsule By Mouth Two Times A Day 3)  Diovan Hct 320-25 Mg Tabs (Valsartan-Hydrochlorothiazide) .... 1/4 By Mouth Daily 4)  Lexapro 10 Mg Tabs (Escitalopram Oxalate) .Marland Kitchen.. 1 By Mouth Daily 5)  Vitamin D (Ergocalciferol) 50000 Unit Caps (Ergocalciferol) .... Once Weekly 6)  Advair Diskus 250-50 Mcg/dose Aepb (Fluticasone-Salmeterol) .Marland Kitchen.. 1 Puff Once Daily 7)  Fluticasone Propionate 50 Mcg/act Susp (Fluticasone Propionate) .... 2 Sprays Each Nosrtil Once Daily As Needed  Allergies (verified): 1)  ! *  Niaspan 2)  ! Advicor (Niacin-Lovastatin) 3)  ! Vioxx 4)  ! * Bextra 5)  ! Wellbutrin 6)  ! Bactrim 7)  ! Sulfa  Past History:  Past Medical History: GERD (ICD-530.81) OBESITY, UNSPECIFIED (ICD-278.00) CAD (ICD-414.00) (Minimal plaque on catheterization November 2011) Ejection fraction   normal by history DEPRESSION/ANXIETY (ICD-300.4) DYSLIPIDEMIA (ICD-272.4) x years HTN x 10 years Smoked (x 20 years 1 1/2 ppd, quit 7 years ago) CAD (first degree relative early) Incisional hernia repair.... November, 2011 Decreased O2 sats    November, 2011 Shortness of breath and chest discomfort with exertion  July 18, 2010  Past Surgical History: Ventral hernia repair x 3 Cholecystectomy  Review of Systems       As stated in the HPI and negative for all other systems.   Vital Signs:  Patient profile:   50 year old male Height:      69 inches Weight:      277 pounds BMI:     41.05 Pulse rate:   81 / minute Resp:     18 per minute BP sitting:   158 / 90  (right arm)  Vitals Entered By: Marrion Coy, CNA (August 15, 2010 3:55 PM)  Physical Exam  General:  The patient is overweight but quite stable. Head:  head is atraumatic. Eyes:  no xanthelasma. Mouth:  Teeth, gums and palate normal. Oral mucosa normal. Neck:  no jugular venous distention. Chest Wall:  no chest wall tenderness. Lungs:  lungs are clear.  Respiratory effort is nonlabored. Abdomen:  abdomen is soft. The incisional hernia repair site looks quite stable. Msk:  no  musculoskeletal deformities. Extremities:  no peripheral edema. Neurologic:  Alert and oriented x 3. Skin:  no skin rashes. Cervical Nodes:  no significant adenopathy Inguinal Nodes:  no significant adenopathy Psych:  patient is oriented to person time and place.  Affect is normal.   Detailed Cardiovascular Exam  Neck    Carotids: Carotids full and equal bilaterally without bruits.      Neck Veins: Normal, no JVD.    Heart     Inspection: no deformities or lifts noted.      Palpation: normal PMI with no thrills palpable.      Auscultation: regular rate and rhythm, S1, S2 without murmurs, rubs, gallops, or clicks.    Vascular    Abdominal Aorta: no palpable masses, pulsations, or audible bruits.      Femoral Pulses: normal femoral pulses bilaterally.      Pedal Pulses: normal pedal pulses bilaterally.      Radial Pulses: normal radial pulses bilaterally.      Peripheral Circulation: no clubbing, cyanosis, or edema noted with normal capillary refill.     EKG  Procedure date:  08/15/2010  Findings:      Sinus rhythm, rate 81, axis within normal limits, intervals within normal limits, no acute ST-T wave changes  Impression & Recommendations:  Problem # 1:  * SHORTNESS OF BREATH WITH EXERTION, CHEST PAIN, INDIGESTION. The patient's dyspnea does not appear to have a cardiac etiology. No further workup is suggested. We discussed primary risk factor modification.  Problem # 2:  OBESITY, UNSPECIFIED (ICD-278.00) The patient I discussed weight loss with diet and exercise.  Problem # 3:  HYPERTENSION (ICD-401.9) His blood pressure is slightly elevated today. However, he just had his meds reduced because of lower blood pressures so I will not make any adjustments but rather encourage weight loss and treatment of his sleep apnea.  Other Orders: EKG w/ Interpretation (93000)  Patient Instructions: 1)  Your physician recommends that you schedule a follow-up appointment as needed 2)  Your physician recommends that you continue on your current medications as directed. Please refer to the Current Medication list given to you today.

## 2010-09-21 NOTE — Miscellaneous (Signed)
Summary: Sleep Study   Clinical Lists Changes  Observations: Added new observation of SLEEP STUDY: AHI: 4.7 Low Oxygen Sat:  83% Oximetry overnight on         RA          =  0.4 min < 88% RDI: 7.0  no severe sleep apnea,  mild transient desaturation no indication for cpap  (08/17/2010 15:20)      Sleep Study  Procedure date:  08/17/2010  Findings:      AHI: 4.7 Low Oxygen Sat:  83% Oximetry overnight on         RA          =  0.4 min < 88% RDI: 7.0  no severe sleep apnea,  mild transient desaturation no indication for cpap   Appended Document: Sleep Study result noted  patient aware

## 2010-09-21 NOTE — Miscellaneous (Signed)
Summary: [facility]  Clinical Lists Changes  Observations: Added new observation of TEEFINDING:   - Left ventricle: The cavity size was normal. Systolic function was     normal. The estimated ejection fraction was in the range of 60% to     65%. Wall motion was normal; there were no regional wall motion     abnormalities.   - Right ventricle: The cavity size was mildly dilated.   - Atrial septum: Echo contrast study showed no right-to-left atrial     level shunt, at baseline or with provocation. (Late bubbles noted     on injection with valsalva felt to be nondiagnostic). (07/20/2010 9:34) Added new observation of CARDCATHFIND: CONCLUSION: 1. Mild nonobstructive coronary artery disease. 2. Normal LV systolic function with an estimated ejection fraction of     55%. 3. Mildly elevated left ventricular end-diastolic pressure. 4. No evidence of pulmonary hypertension by right heart     catheterization. 5. No evidence of left-to-right shunt by saturation run.   RECOMMENDATIONS:  The patient's chest pain does not seem to be cardiac in nature.  His hypoxemia so far does not seem to be cardiac related. However, a right-to-left shunt has not been excluded on the current study.  Thus, I recommend obtaining a transthoracic echocardiogram to rule out a large PFO.  Also consider a transesophageal echocardiogram after his hypoxemia persists without any other explanation.   (07/19/2010 9:33)      Cardiac Cath  Procedure date:  07/19/2010  Findings:      CONCLUSION: 1. Mild nonobstructive coronary artery disease. 2. Normal LV systolic function with an estimated ejection fraction of     55%. 3. Mildly elevated left ventricular end-diastolic pressure. 4. No evidence of pulmonary hypertension by right heart     catheterization. 5. No evidence of left-to-right shunt by saturation run.   RECOMMENDATIONS:  The patient's chest pain does not seem to be cardiac in nature.  His  hypoxemia so far does not seem to be cardiac related. However, a right-to-left shunt has not been excluded on the current study.  Thus, I recommend obtaining a transthoracic echocardiogram to rule out a large PFO.  Also consider a transesophageal echocardiogram after his hypoxemia persists without any other explanation.    TE Echocardiogram  Procedure date:  07/20/2010  Findings:        - Left ventricle: The cavity size was normal. Systolic function was     normal. The estimated ejection fraction was in the range of 60% to     65%. Wall motion was normal; there were no regional wall motion     abnormalities.   - Right ventricle: The cavity size was mildly dilated.   - Atrial septum: Echo contrast study showed no right-to-left atrial     level shunt, at baseline or with provocation. (Late bubbles noted     on injection with valsalva felt to be nondiagnostic).

## 2010-09-21 NOTE — Progress Notes (Signed)
Summary: ROV scheduled  ---- Converted from flag ---- ---- 08/30/2010 3:18 PM, Storm Frisk MD wrote: pt needs rov sooner than two months ------------------------------  Phone Note Outgoing Call   Call placed by: Gweneth Dimitri RN,  August 31, 2010 9:59 AM Call placed to: Patient Summary of Call: Called, spoke with pt's wife as pt was unavailable.  States she can set up appt for pt.  ROV scheduled with PW for 09-07-10 at 3pm in [place].  Wife aware and will inform pt. Initial call taken by: Gweneth Dimitri RN,  August 31, 2010 10:00 AM

## 2010-09-21 NOTE — Letter (Signed)
Summary: New Patient letter  [facility]  [address] [place], [place] [address]   Phone: [phone]  Fax: [phone]       07/31/2010 MRN: [id number]  David Black [address] [place], [place]  [address]  Dear Mr. David Black,  Welcome to the Gastroenterology Division at [facility].    You are scheduled to see Dr.  Arlyce Dice  on 08/22/10 at 2pm on the 3rd floor at [facility], [address]  We ask that you try to arrive at our office 15 minutes prior to your appointment time to allow for check-in.  We would like you to complete the enclosed self-administered evaluation form prior to your visit and bring it with you on the day of your appointment.  We will review it with you.  Also, please bring a complete list of all your medications or, if you prefer, bring the medication bottles and we will list them.  Please bring your insurance card so that we may make a copy of it.  If your insurance requires a referral to see a specialist, please bring your referral form from your primary care physician.  Co-payments are due at the time of your visit and may be paid by cash, check or credit card.     Your office visit will consist of a consult with your physician (includes a physical exam), any laboratory testing he/she may order, scheduling of any necessary diagnostic testing (e.g. x-ray, ultrasound, CT-scan), and scheduling of a procedure (e.g. Endoscopy, Colonoscopy) if required.  Please allow enough time on your schedule to allow for any/all of these possibilities.    If you cannot keep your appointment, please call [phone] to cancel or reschedule prior to your appointment date.  This allows [place] the opportunity to schedule an appointment for another patient in need of care.  If you do not cancel or reschedule by 5 p.m. the business day prior to your appointment date, you will be charged a $50.00 late cancellation/no-show fee.    Thank you for choosing [facility] for your medical needs.  We appreciate the opportunity to care for you.  Please visit [place] at our website  to learn more about our practice.                     Sincerely,                                                             The Gastroenterology Division

## 2010-09-21 NOTE — Letter (Signed)
Summary: Patient Cancellation/[facility]  Patient Cancellation/[facility]   Imported By: Lanelle Bal 08/31/2010 10:44:10  _____________________________________________________________________  External Attachment:    Type:   Image     Comment:   External Document

## 2010-09-21 NOTE — Assessment & Plan Note (Addendum)
Summary: Pulmonary OV   Primary Provider/Referring Provider:  [name], MD  CC:  1 month followup and sob with exertion  no cough no wheezing.  History of Present Illness: Pulmonary Post hosp f/u  This pt was in  [facility] 11/29--12/1.  Pt admitted for dyspnea. Studies showed: PFTs obstruction in small airways  Pt has CAD only mild on cath.  Pt had  RHC that showed  no PULM HTN> No shunt seen.   A TEE showed  no PFO or ASD CT chest neg for PE and DVT.    Pt notes some BP meds held  so may be lightheaded from meds. Pt notes had crestor/zetia/trilipix/lovaza all stopped after hernia surgery due to elevated LFTs  Pt notes since meds adjusted still with occasional lightheadedness The dyspnea is improved and was d/c on advair and dyspnea is better.   No cough, no chest pain. No wheeze noted.  September 07, 2010 3:13 PM Pt is still dyspneic with exertion Sleep study was normal Pt notes occ cough The pt is on advair one puff daily   Current Medications (verified): 1)  Aspirin 81 Mg  Tabs (Aspirin) .Marland Kitchen.. 1 By Mouth Daily 2)  Dexilant 60 Mg Cpdr (Dexlansoprazole) .... Take 1 Capsule By Mouth Two Times A Day 3)  Diovan Hct 320-25 Mg Tabs (Valsartan-Hydrochlorothiazide) .... 1/4 By Mouth Daily 4)  Lexapro 10 Mg Tabs (Escitalopram Oxalate) .Marland Kitchen.. 1 By Mouth Daily 5)  Vitamin D (Ergocalciferol) 50000 Unit Caps (Ergocalciferol) .... Once Weekly 6)  Advair Diskus 250-50 Mcg/dose Aepb (Fluticasone-Salmeterol) .Marland Kitchen.. 1 Puff Once Daily 7)  Fluticasone Propionate 50 Mcg/act Susp (Fluticasone Propionate) .... 2 Sprays Each Nosrtil Once Daily As Needed  Allergies: 1)  ! * Niaspan 2)  ! Advicor (Niacin-Lovastatin) 3)  ! Vioxx 4)  ! * Bextra 5)  ! Wellbutrin 6)  ! Bactrim 7)  ! Sulfa  Past History:  Past medical, surgical, family and social histories (including risk factors) reviewed, and no changes noted (except as noted below).  Past Medical History: Reviewed history from 08/15/2010 and no  changes required. GERD (ICD-530.81) OBESITY, UNSPECIFIED (ICD-278.00) CAD (ICD-414.00) (Minimal plaque on catheterization November 2011) Ejection fraction   normal by history DEPRESSION/ANXIETY (ICD-300.4) DYSLIPIDEMIA (ICD-272.4) x years HTN x 10 years Smoked (x 20 years 1 1/2 ppd, quit 7 years ago) CAD (first degree relative early) Incisional hernia repair.... November, 2011 Decreased O2 sats    November, 2011 Shortness of breath and chest discomfort with exertion  July 18, 2010  Past Surgical History: Reviewed history from 08/15/2010 and no changes required. Ventral hernia repair x 3 Cholecystectomy  Family History: Reviewed history from 08/03/2010 and no changes required. There is a strong family history of coronary artery disease. Paternal aunts - breast ca Paternal uncle - testicular ca, heart disease Father - heart disease  Social History: Reviewed history from 08/03/2010 and no changes required. The patient is married.   Former Smoker.  Quit in 2004.  Up to 1 1/2 ppd x 20 yrs 1 daughter works in Insurance account manager  Review of Systems       The patient complains of shortness of breath with activity.  The patient denies shortness of breath at rest, productive cough, non-productive cough, coughing up blood, chest pain, irregular heartbeats, acid heartburn, indigestion, loss of appetite, weight change, abdominal pain, difficulty swallowing, sore throat, tooth/dental problems, headaches, nasal congestion/difficulty breathing through nose, sneezing, itching, ear ache, anxiety, depression, hand/feet swelling, joint stiffness or pain, rash, change in color of mucus,  and fever.    Vital Signs:  Patient profile:   50 year old male Height:      69 inches Weight:      271 pounds BMI:     40.16 O2 Sat:      96 % on Room air Pulse rate:   85 / minute BP sitting:   110 / 70  (right arm) Cuff size:   large  Vitals Entered By: Kandice Hams CMA (September 07, 2010 3:10 PM)  O2 Flow:   Room air  Physical Exam  Additional Exam:  Gen: Pleasant, well-nourished, in no distress,  normal affect ENT: No lesions,  mouth clear,  oropharynx clear, no postnasal drip Neck: No JVD, no TMG, no carotid bruits Lungs: No use of accessory muscles, no dullness to percussion, distant BS, no wheeze Cardiovascular: RRR, heart sounds normal, no murmur or gallops, no peripheral edema Abdomen: soft and NT, no HSM,  BS normal Musculoskeletal: No deformities, no cyanosis or clubbing Neuro: alert, non focal Skin: Warm, no lesions or rashes    Impression & Recommendations:  Problem # 1:  EXTRINSIC ASTHMA, UNSPECIFIED (ICD-493.00) Assessment Improved improved reactive airway disease plan  try to wean ics to off , switch to flovent Disc  one puff daily until disk empty then stop as needed saba  note sleep study normal,  no sleep apnea. pt now off oxygnen  Medications Added to Medication List This Visit: 1)  Flovent Diskus 250 Mcg/blist Aepb (Fluticasone propionate (inhal)) .... One puff daily until sample gone  Complete Medication List: 1)  Aspirin 81 Mg Tabs (Aspirin) .Marland Kitchen.. 1 by mouth daily 2)  Dexilant 60 Mg Cpdr (Dexlansoprazole) .... Take 1 capsule by mouth two times a day 3)  Diovan Hct 320-25 Mg Tabs (Valsartan-hydrochlorothiazide) .... 1/4 by mouth daily 4)  Lexapro 10 Mg Tabs (Escitalopram oxalate) .Marland Kitchen.. 1 by mouth daily 5)  Vitamin D (ergocalciferol) 50000 Unit Caps (Ergocalciferol) .... Once weekly 6)  Flovent Diskus 250 Mcg/blist Aepb (Fluticasone propionate (inhal)) .... One puff daily until sample gone 7)  Fluticasone Propionate 50 Mcg/act Susp (Fluticasone propionate) .... 2 sprays each nosrtil once daily as needed  Other Orders: Est. Patient Level III (81191)  Patient Instructions: 1)  Change to Flovent one puff daily until sample gone 2)  Stop advair 3)  Return 4 months [place], sooner if symptoms worsen  Appended Document: Pulmonary OV fax don moore

## 2010-09-25 ENCOUNTER — Ambulatory Visit: Payer: Self-pay | Admitting: Gastroenterology

## 2010-10-30 LAB — BASIC METABOLIC PANEL
CO2: 28 mEq/L (ref 19–32)
Calcium: 9.3 mg/dL (ref 8.4–10.5)
Creatinine, Ser: 1.17 mg/dL (ref 0.4–1.5)
GFR calc Af Amer: >60 mL/min (ref >60)
Glucose, Bld: 131 mg/dL — ABNORMAL HIGH (ref 70–99)

## 2010-10-30 LAB — CBC
MCH: 30.5 pg (ref 26.0–34.0)
MCHC: 33.4 g/dL (ref 30.0–36.0)
Platelets: 184 10*3/uL (ref 150–400)
RBC: 4.23 MIL/uL (ref 4.22–5.81)

## 2010-10-31 LAB — GLUCOSE, CAPILLARY
Glucose-Capillary: 126 mg/dL — ABNORMAL HIGH (ref 70–99)
Glucose-Capillary: 93 mg/dL (ref 70–99)
Glucose-Capillary: 98 mg/dL (ref 70–99)
Glucose-Capillary: 119 mg/dL — ABNORMAL HIGH (ref 70–99)
Glucose-Capillary: 125 mg/dL — ABNORMAL HIGH (ref 70–99)
Glucose-Capillary: 157 mg/dL — ABNORMAL HIGH (ref 70–99)
Glucose-Capillary: 92 mg/dL (ref 70–99)

## 2010-10-31 LAB — HEPARIN LEVEL (UNFRACTIONATED)
Heparin Unfractionated: 0.11 IU/mL — ABNORMAL LOW (ref 0.30–0.70)
Heparin Unfractionated: 0.14 IU/mL — ABNORMAL LOW (ref 0.30–0.70)

## 2010-10-31 LAB — COMPREHENSIVE METABOLIC PANEL
AST: 53 U/L — ABNORMAL HIGH (ref 0–37)
Albumin: 4.3 g/dL (ref 3.5–5.2)
Alkaline Phosphatase: 38 U/L — ABNORMAL LOW (ref 39–117)
BUN: 12 mg/dL (ref 6–23)
BUN: 11 mg/dL (ref 6–23)
Calcium: 9.7 mg/dL (ref 8.4–10.5)
Calcium: 9.9 mg/dL (ref 8.4–10.5)
Chloride: 105 mEq/L (ref 96–112)
Creatinine, Ser: 1.27 mg/dL (ref 0.4–1.5)
GFR calc Af Amer: >60 mL/min (ref >60)
Glucose, Bld: 122 mg/dL — ABNORMAL HIGH (ref 70–99)
Total Protein: 7.1 g/dL (ref 6.0–8.3)
Total Protein: 7.4 g/dL (ref 6.0–8.3)

## 2010-10-31 LAB — POCT I-STAT 3, VENOUS BLOOD GAS (G3P V)
Acid-Base Excess: 1 mmol/L (ref 0.0–2.0)
Acid-Base Excess: 2 mmol/L (ref 0.0–2.0)
Acid-Base Excess: 3 mmol/L — ABNORMAL HIGH (ref 0.0–2.0)
O2 Saturation: 66 %
O2 Saturation: 67 %
TCO2: 29 mmol/L (ref 0–100)
TCO2: 30 mmol/L (ref 0–100)
TCO2: 31 mmol/L (ref 0–100)
pCO2, Ven: 51.4 mmHg — ABNORMAL HIGH (ref 45.0–50.0)
pH, Ven: 7.36 — ABNORMAL HIGH (ref 7.250–7.300)

## 2010-10-31 LAB — TSH: TSH: 1.483 u[IU]/mL (ref 0.350–4.500)

## 2010-10-31 LAB — PROTIME-INR
INR: 1.01 (ref 0.00–1.49)
Prothrombin Time: 13.5 seconds (ref 11.6–15.2)

## 2010-10-31 LAB — BASIC METABOLIC PANEL
BUN: 11 mg/dL (ref 6–23)
Chloride: 106 mEq/L (ref 96–112)
Creatinine, Ser: 1.08 mg/dL (ref 0.4–1.5)
Glucose, Bld: 130 mg/dL — ABNORMAL HIGH (ref 70–99)

## 2010-10-31 LAB — BLOOD GAS, ARTERIAL
Drawn by: 22251
FIO2: 0.21 %
O2 Saturation: 97.3 %
Patient temperature: 98.6
pO2, Arterial: 93.5 mmHg (ref 80.0–100.0)

## 2010-10-31 LAB — CBC
HCT: 41.5 % (ref 39.0–52.0)
MCH: 32 pg (ref 26.0–34.0)
MCHC: 34.2 g/dL (ref 30.0–36.0)
MCV: 89.6 fL (ref 78.0–100.0)
MCV: 91.2 fL (ref 78.0–100.0)
Platelets: 214 10*3/uL (ref 150–400)
RBC: 4.67 MIL/uL (ref 4.22–5.81)
RDW: 13.4 % (ref 11.5–15.5)
RDW: 13.5 % (ref 11.5–15.5)

## 2010-10-31 LAB — POCT I-STAT 3, ART BLOOD GAS (G3+)
Acid-Base Excess: 1 mmol/L (ref 0.0–2.0)
pCO2 arterial: 46.1 mmHg — ABNORMAL HIGH (ref 35.0–45.0)
pO2, Arterial: 90 mmHg (ref 80.0–100.0)

## 2010-10-31 LAB — CARDIAC PANEL(CRET KIN+CKTOT+MB+TROPI): Troponin I: 0.03 ng/mL (ref 0.00–0.06)

## 2010-10-31 LAB — SURGICAL PCR SCREEN: Staphylococcus aureus: POSITIVE — AB

## 2010-10-31 LAB — BRAIN NATRIURETIC PEPTIDE: Pro B Natriuretic peptide (BNP): <30 pg/mL (ref 0.0–100.0)

## 2010-11-30 LAB — URINALYSIS, ROUTINE W REFLEX MICROSCOPIC
Bilirubin Urine: NEGATIVE
Ketones, ur: NEGATIVE mg/dL
Nitrite: NEGATIVE
Protein, ur: NEGATIVE mg/dL
pH: 7.5 (ref 5.0–8.0)

## 2010-11-30 LAB — CBC
HCT: 41.9 % (ref 39.0–52.0)
MCHC: 34.5 g/dL (ref 30.0–36.0)
MCV: 94.6 fL (ref 78.0–100.0)
Platelets: 214 10*3/uL (ref 150–400)
WBC: 6 10*3/uL (ref 4.0–10.5)

## 2011-01-02 NOTE — Op Note (Signed)
NAMEDAMANI, KELEMEN NO.:  [id number]   MEDICAL RECORD NO.:  [id number]          PATIENT TYPE:  INP   LOCATION:  0098                         FACILITY:  [facility]   PHYSICIAN:  Sandria Bales. Ezzard Standing, M.D.  DATE OF BIRTH:  08-29-1960   DATE OF PROCEDURE:  07/15/2007  DATE OF DISCHARGE:                               OPERATIVE REPORT   PREOPERATIVE DIAGNOSIS:  Incarcerated umbilical hernia.   POSTOPERATIVE DIAGNOSIS:  Incarcerated umbilical hernia with  preperitoneal fat.   PROCEDURE:  Open umbilical herniorrhaphy.   SURGEON:  Ezzard Standing.   FIRST ASSISTANT:  None.   ANESTHESIA:  General endotracheal and approximately 20 mL of 0.25%  Marcaine.   COMPLICATIONS:  None.   INDICATIONS:  Mr. Bertsch is a 50 year old white male who is a patient of  Dr. [name] who for about a month or two has noted a mass in his  umbilicus which has become increasingly tender.  He presents to the  [facility] emergency room today with an incarcerated umbilical hernia.  I do not think the patient has any bowel in the hernia, but he was  interested in having this repaired today.  His wife was with him in the  ER while I discussed this with him.   I discussed with him the indications and potential complications of  hernia surgery.  Potential complications include but are not limited to  bleeding, infection, recurrence of the hernia.   OPERATIVE NOTE:  The patient was placed in the supine position.  The  abdomen was shaved and prepped with Betadine solution.  A time out was  held identifying the patient and procedure.   He had an infraumbilical incision made with sharp dissection carried  down to the anterior rectus fascia.  I elevated the skin of the  umbilicus off the fascia.  He had an approximately 1.5-cm defect.  I was  able to reduce this preperitoneal fat back into the subfascial space.  I  then closed the umbilical hernia with interrupted 0 Novofil sutures.  I  then tacked the skin of  the umbilicus down using 3-0 Vicryl sutures,  closed the subcutaneous tissue with 3-0 Vicryl suture.  I then  infiltrated the subfascial and subcutaneous spaces with approximately 20  mL of 0.25% Marcaine.  I closed the skin with a running 5-0 Vicryl  suture, painted the wound with a tincture of benzoin and steri-stripped  it and sterilely dressed it.   The plan is to let the patient go home today. He will see me back in 2  to 3 weeks for followup.  He will be given Vicodin for pain and will  call for any interval problem.      Sandria Bales. Ezzard Standing, M.D.  Electronically Signed     DHN/MEDQ  D:  07/15/2007  T:  07/15/2007  Job:  [id number]   cc:   Ernestina Penna, M.D.  Fax: [phone]

## 2011-01-02 NOTE — Assessment & Plan Note (Signed)
 HEALTHCARE                         GASTROENTEROLOGY OFFICE NOTE   NAME:Greenwood, RAKEEN GAILLARD                       MRN:          [id number]  DATE:02/18/2007                            DOB:          October 12, 1960    PROBLEM:  Chest pain.   Mr. Korol has returned for reevaluation. Over the past two months, he  has been experiencing intermittent chest pain. The pain is described as  sharp or shooting pain in the chest. It is not related to eating nor is  it exertional. He underwent cardiac workup that was negative. The pain  initially lasted for two days. Since that time, he has had intermittent  episodes lasting minutes at a time. He denies dysphagia or odynophagia.  Symptoms have not particularly improved with Aciphex twice a day. The  pain is without radiation. He denies nausea or pyrosis. He has had no  gastric irritants including nonsteroidal's.   MEDICATIONS:  1. Lowasa.  2. Zetia.  3. Welchol.  4. Lexapro.  5. Aciphex.  6. Baby aspirin.   ALLERGIES:  VIOXX, WELLBUTRIN AND NIASPAN.   PHYSICAL EXAMINATION:  Pulse 68, blood pressure 130/84, weight 248.  HEENT: EOMI. PERRLA. Sclerae are anicteric.  Conjunctivae are pink.  NECK:  Supple without thyromegaly, adenopathy or carotid bruits.  CHEST:  Clear to auscultation and percussion without adventitious  sounds.  CARDIAC:  Regular rhythm; normal S1 S2.  There are no murmurs, gallops  or rubs.  ABDOMEN:  Bowel sounds are normoactive.  Abdomen is soft, non-tender and  non-distended.  There are no abdominal masses, tenderness, splenic  enlargement or hepatomegaly.  EXTREMITIES:  Full range of motion.  No cyanosis, clubbing or edema.  RECTAL:  Deferred.   IMPRESSION:  Chest pain. Symptoms are not typical for reflux. It is  possible that he could be experiencing esophageal spasm, perhaps related  to acid reflux. Biliary tract etiology is unlikely.   RECOMMENDATIONS:  1. Decrease Aciphex to 20 mg  daily.  2. Upper endoscopy.     Barbette Hair. Arlyce Dice, MD,FACG  Electronically Signed    RDK/MedQ  DD: 02/18/2007  DT: 02/18/2007  Job #: [id number]   cc:   Ernestina Penna, M.D.  Rollene Rotunda, MD, [facility]

## 2011-01-02 NOTE — H&P (Signed)
NAME:  David Black, David Black NO.:  [id number]   MEDICAL RECORD NO.:  [id number]          PATIENT TYPE:  EMS   LOCATION:  ED                           FACILITY:  [facility]   PHYSICIAN:  Sandria Bales. Ezzard Standing, M.D.  DATE OF BIRTH:  1961/06/04   DATE OF ADMISSION:  07/15/2007  DATE OF DISCHARGE:                              HISTORY & PHYSICAL   HISTORY OF PRESENT ILLNESS:  This is a 50 year old white male who is a  patient of Dr. [name] and actually his wife works for [employer] as a Engineer, civil (consulting).  He has had about a two month history of a  knot he has noticed around his umbilicus.  Over the last month, this  knot has gotten increasingly tender.  He presented to Dr. Kathi Der office  with a tender umbilical mass.  Dr. Christell Constant thought it was an incarcerated  hernia, called me and I asked him to come down to [facility] emergency  room.   [name] has had no prior abdominal surgery.  He did have a right  inguinal hernia repaired in 1986 in [place] by an unknown physician.  He has had some reflux.  He had an upper endoscopy by Dr. Lina Sar in  2004.  He has no history of liver disease, gallbladder disease,  pancreatic disease or colon disease.  He has gained about 30 to 40  pounds over the last year with his weight estimated about 265 right now.   ALLERGIES:  1. BEXTRA which causes swelling.  2. SULFA makes him sick.  3. VIOXX.  4. NIASPAN causes swelling.   CURRENT MEDICATIONS:  1. Altace 5 mg daily.  2. TriCor 125 mg daily.  3. Lexapro.  4. Zetia.  5. Baby aspirin 81 mg daily.  6. AcipHex daily.  7. Libaca.   REVIEW OF SYSTEMS:  NEUROLOGIC:  No seizures or loss of consciousness.  PULMONARY:  He does not smoke cigarettes.  No pneumonia or tuberculosis.  CARDIAC:  He had a cardiac catheterization through [facility] in  2004.  He had some defects which may represent about a 15-30% stenosis,  but he had no angioplasty, no stent placement, no  further recommended  treatment.  He did have a stress test by Dr. Antoine Poche this year which is  reported as negative.  GASTROINTESTINAL:  See history of present  illness.  UROLOGIC:  No signs of kidney infections.   SOCIAL HISTORY:  He is accompanied by his wife and daughter.  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   PHYSICAL EXAMINATION:  VITAL SIGNS:  Temperature 98.4, blood pressure  120/77, pulse 70, respirations 20, weight approximately 265.  GENERAL APPEARANCE:  He is a well-developed, moderately obese white male  alert and cooperative.  HEENT:  Unremarkable.  NECK:  Supple without mass or thyromegaly.  CHEST:  His lungs are clear to auscultation with symmetric breath  sounds.  CARDIOVASCULAR:  His heart has a regular rate and rhythm.  I hear no  murmur or rub.  ABDOMEN:  Soft.  Immediately above his  umbilicus he has an approximate  1.5 to 2 cm incarcerated umbilical hernia which is tender.  I think this  is just incarcerated fat and not bowel as he has no GI symptoms.  He has  active bowel sounds without any nausea, vomiting or tenderness.  RECTAL:  I did not do a rectal exam on him.  EXTREMITIES:  He has good strength in all four extremities.  NEUROLOGIC:  Grossly intact.   LABORATORY DATA:  He brings blood work down from [facility] with a hemoglobin of 14, hematocrit 42, white blood cell count  6600.   IMPRESSION:  1. Incarcerated umbilical hernia.  Discussed with the patient about      going ahead and proceeding with repair of this today.  His wife was      with his during this discussion.  I talked about the indications and potential risks.  Potential risks  include but not limited to bleeding, infection, recurrence of the  hernia.  If he does want, I can actually even operate on him tonight if  he feels up to it.  1. Hypertension.  2. Hypercholesterolemia.  3. Irritability on Lexapro.  4. Gastroesophageal reflux disease.  5.  Obesity.  He understands he really needs to lose weight if he wants      this hernia repair to hold up.  I went over this with him and his      wife.  6. Early cardiac disease seen on cardiac catheterization four years      ago.      Sandria Bales. Ezzard Standing, M.D.  Electronically Signed     DHN/MEDQ  D:  07/15/2007  T:  07/15/2007  Job:  [id number]   cc:   Ernestina Penna, M.D.  Fax: [phone]

## 2011-01-02 NOTE — Assessment & Plan Note (Signed)
[facility] HEALTHCARE                            CARDIOLOGY OFFICE NOTE   NAME:David Black, David Black                       MRN:          [id number]  DATE:01/22/2007                            DOB:          [birth year]    PRIMARY CARE PHYSICIAN:  Ernestina Penna, M.D.   REASON FOR VISIT:  Evaluate patient with chest pain and hypertension.   HISTORY OF PRESENT ILLNESS:  The patient is 50 years old. He was  hospitalized in April for chest discomfort. He was an overnight stay.  There was no objective evidence of ischemia. He had had a previous  stress test a few weeks earlier that was negative for any evidence of  ischemia. Since that time, he has seen Dr. Christell Constant and had his Prevacid  doubled. He thinks he is having less discomfort. He still gets it, it is  sporadic. It seems to be somewhat sharp and occasionally shooting. He is  not getting it as frequent and it is not lasting as long. He does not  associate it with activity. He does not associate it with foods. He is  not having any associated symptoms. He is not having any new shortness  of breath, PND, or orthopnea. He is having no palpitations, presyncope  or syncope.   PAST MEDICAL HISTORY:  Hyperlipidemia, hypertension, nonobstructive  coronary disease (catheterization November 2004, left main normal, LAD  10-15% proximal, 30% mid stenosis, diagonal 30% stenosis, circumflex 20-  30% stenosis, right coronary artery 30% stenosis, EF 55%).   ALLERGIES:  VIOXX, WELLBUTRIN, NIASPAN.   MEDICATIONS:  1. Aspirin 325 mg daily.  2. Welchol.  3. Zetia 10 mg daily.  4. Lexapro 20 mg daily.  5. AcipHex 20 mg daily.   REVIEW OF SYSTEMS:  As stated in the HPI and otherwise negative for  other systems.   PHYSICAL EXAMINATION:  GENERAL:  The patient is in no distress.  VITAL SIGNS:  Blood pressure 150/108, heart rate 71 and regular, weight  247 pounds, body mass index 35.  HEENT:  Eyelids unremarkable. Pupils equal  round and reactive to light.  Fundi not visualized. Oral mucosa unremarkable.  NECK:  No jugular venous distention, wave forms within normal limits,  carotid upstroke brisk and symmetric, no bruits, no thyromegaly.  LYMPHATICS:  No cervical, axillary or inguinal adenopathy.  LUNGS:  Clear to auscultation bilaterally.  BACK:  No costovertebral angle tenderness.  CHEST:  Unremarkable.  HEART:  PMI not displaced or sustained, S1 and S2 within normal limits,  no S3, no S4, no clicks, no rubs, no murmurs.  ABDOMEN:  Obese, positive bowel sounds, normal to frequency and pitch,  no bruits, no rebound, no guarding, no midline pulsative mass, no  hepatomegaly, no splenomegaly.  SKIN:  No rashes, no nodules.  EXTREMITIES:  2+ pulses, no edema.   ASSESSMENT/PLAN:  1. Chest discomfort. The patient is having chest discomfort that does      not seem to have a cardiac etiology. It is most likely      gastrointestinal. He is going to followup with Dr. Christell Constant for  continued evaluation and management of this.  2. Nonobstructive coronary disease. He will continue with secondary      risk reduction. He is having excellent lipid management by Dr.      Christell Constant.  3. Hypertension. He is not at target which is 140/90 with an ideal of      130/80. Therefore, I am going to restart Ramipril 2.5 mg daily. He      tolerated this drug without problem in the past. He will follow      with Dr. Christell Constant.  4. Obesity. We discussed the need to lose weight with diet and      exercise.  5. Followup will be back in this clinic as needed.     Rollene Rotunda, MD, [facility]  Electronically Signed    JH/MedQ  DD: 01/22/2007  DT: 01/22/2007  Job #: [id number]   cc:   Ernestina Penna, M.D.

## 2011-01-02 NOTE — Op Note (Signed)
NAMESAMANTHA, David Black                ACCOUNT NO.:  [id number]   MEDICAL RECORD NO.:  [id number]          PATIENT TYPE:  OUT   LOCATION:  ULT                          FACILITY:  [facility]   PHYSICIAN:  Sharlet Salina T. Hoxworth, M.D.DATE OF BIRTH:  1961/02/26   DATE OF PROCEDURE:  10/29/2008  DATE OF DISCHARGE:  10/29/2008                               OPERATIVE REPORT   PREOPERATIVE DIAGNOSES:  Cholelithiasis and cholecystitis.   POSTOPERATIVE DIAGNOSES:  Cholelithiasis and cholecystitis.   SURGICAL PROCEDURE:  Laparoscopic cholecystectomy with intraoperative  cholangiogram.   SURGEON:  Sharlet Salina T. Hoxworth, MD   ASSISTANT:  Gabrielle Dare. Janee Morn, MD   ANESTHESIA:  General.   BRIEF HISTORY:  Mr. Weick is a 50 year old male who presents with  several days of intermittent and now 24 hours of more constant  epigastric and right upper quadrant abdominal pain.  Workup has revealed  cholelithiasis with a stone in the neck of the gallbladder and some  thickening of gallbladder wall.  I have recommend proceeding with  laparoscopic cholecystectomy with cholangiogram for apparent early  cholecystitis.  The nature of the procedure, its indications, risks of  bleeding, infection, bile leak, bile duct injury, and anesthetic  complications were discussed and understood.  He is now brought to the  operating room for this procedure.   DESCRIPTION OF OPERATION:  The patient was brought to the operating room  and placed in supine position on the operating table and general  endotracheal anesthesia was induced.  The abdomen was widely sterilely  prepped and draped.  He was given broad-spectrum preoperative IV  antibiotics.  Correct patient and procedure were verified.  Due to  obesity, I used an incision above the umbilicus in the midline and  dissection was carried down to the midline fascia which was incised for  1 cm and the peritoneum entered under direct vision.  Through a mattress  suture of 0 Vicryl,  the Hasson trocar was placed and pneumoperitoneum  established.  Under direct vision, a 11-mm trocar was placed in  subxiphoid area and two 5-mm trocars, one in the right subcostal margin.  The gallbladder was tensely distended and slightly edematous.  The  fundus was grasped and elevated up over the liver and some early  inflammatory omental adhesions were taken down off the infundibulum  which was retracted inferolaterally.  Fibrofatty tissue was stripped off  the neck of the gallbladder towards the porta hepatis.  Peritoneum  anterior and posterior to Calot triangle was dissected.  The cystic duct  was identified.  The right hepatic duct was seen actually lying just  posterior to the infundibular gallbladder, but it was identified without  difficulty and protected throughout the remainder of dissection.  The  Calot triangle was thoroughly dissected and the cystic artery and cystic  lymph node were identified.  The cystic duct was further dissected and  cystic duct and gallbladder junction dissected 360 degrees and the  cystic duct dissected out over about a centimeter.  When the anatomy  appeared clear, the cystic duct was clipped at the gallbladder junction  and an operative  cholangiogram was obtained through the cystic duct.  This showed good filling of normal common bile duct and intrahepatic  ducts with free flow into the duodenum and no filling defects.  Following this, cholangiocath was removed and the cystic duct was doubly  clipped proximally and divided.  The cystic artery was further  skeletonized, clearly identified and doubly clipped proximally and  clipped distally.  The gallbladder was then dissected free from its bed  using hook cautery, placed in an EndoCatch bag, and removed through the  periumbilical incision.  The right upper quadrant was irrigated.  Complete hemostasis was  assured.  Trocars were removed and all CO2 evacuated.  The mattress  suture was secured at  the umbilicus.  Skin incisions were closed with  subcuticular Monocryl and Dermabond.  Sponges, needle, and instrument  counts were correct.  The patient was taken to the recovery room in good  condition.      Lorne Skeens. Hoxworth, M.D.  Electronically Signed     BTH/MEDQ  D:  10/29/2008  T:  10/29/2008  Job:  [id number]

## 2011-01-02 NOTE — H&P (Signed)
NAMEALPER, GUILMETTE                ACCOUNT NO.:  [id number]   MEDICAL RECORD NO.:  [id number]          PATIENT TYPE:  OUT   LOCATION:  ULT                          FACILITY:  [facility]   PHYSICIAN:  Sharlet Salina T. Hoxworth, M.D.DATE OF BIRTH:  [birth year]   DATE OF ADMISSION:  10/28/2008  DATE OF DISCHARGE:                              HISTORY & PHYSICAL   ADMITTING PHYSICIAN:  Sharlet Salina T. Hoxworth, MD   PRIMARY CARE PHYSICIAN:  Ernestina Penna, MD   CHIEF COMPLAINT:  Epigastric abdominal pain.   HISTORY OF PRESENT ILLNESS:  Mr. Evett is a 50 year old male patient  with history of hypertension, dyslipidemia, and degenerative disk  disease who developed new onset of epigastric abdominal pain, the first  episode was this Friday.  The patient does have a history of GERD, but  this is not like his GERD symptoms.  The pain on Friday was very severe  in nature, radiated to the back, and lasted about 3 hours, and then  resolved spontaneously.  He had an additional episode yesterday evening,  late in the evening.  This episode lasted about 2 hours, although the  pain decreased and then resolved.  He sought treatment at his primary  care physician's office today.  Several labs were drawn and results are  unknown at this time.  An ultrasound was performed at [facility] today that  did demonstrate multiple gallstones with one in the neck of the  gallbladder measuring 1.2 cm, fatty infiltration of liver, no  gallbladder wall thickening was noted, common bile duct was 5 mm, and no  obvious choledocholithiasis.  Because of continued pain and the symptoms  the patient has presented with, it was opted to admit the patient to  hospital and proceed with cholecystectomy either this afternoon or first  thing tomorrow morning.   REVIEW OF SYSTEMS:  As per the history present illness.  The patient has  had some nausea with this pain, but no emesis.  Otherwise, review of  systems categories negative or  noncontributory.   PAST MEDICAL HISTORY:  1. Dyslipidemia.  2. Hypertension.  3. Degenerative disk disease.  4. Nonobstructive CAD by cath several years prior.  5. GERD.   PAST SURGICAL HISTORY:  Hernia repair x2, the last hernia repair was due  to an incarcerated umbilical hernia in November 2008.  He had an open  umbilical herniorrhaphy by Dr. Ezzard Standing without any placement of mesh.  Other surgical procedures include LASIK procedure to both eyes and an  orthopedic procedure to problems with fingers.   FAMILY HISTORY:  Noncontributory.   SOCIAL HISTORY:  No tobacco.  Social alcohol only, rare occasional.  He  is married.  He works in Insurance account manager.  No heavy of lifting involved with  his job.   DRUG ALLERGIES:  Multiple include:  1. BACTRIM, which causes abdominal pain.  2. NIASPAN and ADVICOR, which cause myalgias.  3. VIOXX and BEXTRA cause edema.  4. WELLBUTRIN causes tremors.   CURRENT MEDICATIONS:  1. Crestor 5 mg tablets one-quarter tablet every other day.  2. Aspirin 81 mg daily.  3. Aciphex  20 mg daily.  4. Zetia 10 mg daily.  5. Ramipril 5 mg daily.  6. Lexapro 10 mg daily.  7. Amrix 15 mg daily p.r.n. back spasms.  8. Lovaza 1 g 2 tablets b.i.d.   PHYSICAL EXAMINATION:  GENERAL:  A pleasant male patient complaining of  continued epigastric abdominal pain, although improved compared to last  p.m. symptoms.  PSYCH:  The patient is alert and oriented x3.  Affect appropriate to  current situation.  NEURO:  Cranial nerves II through XII are grossly intact.  No focal  deficits noted, moving all extremities x4.  He was ambulatory at home  and to the ER triage area.  HEENT:  Eyes, sclerae noninjected and nonicteric.  Pupils are equal and  reactive to light.  Ears, nose, and throat:  Ears are symmetrical.  No  otorrhea.  Nose is midline.  No rhinorrhea.  Oral mucous membranes pink  and moist.  CHEST:  Bilateral lung sounds clear to auscultation.  Respiratory effort  is  nonlabored.  CARDIOVASCULAR:  Heart sounds S1 and S2.  No rubs, murmurs, or gallops.  No JVD.  No peripheral edema.  ABDOMEN:  Obese but soft, tender in the epigastric region, otherwise  nontender.  No evidence for recurrent hernias.  Bowel sounds are  present.  EXTREMITIES:  Symmetrical.  No cyanosis or clubbing.   LABORATORY DATA:  We have received a CBC report from [facility] which shows a white count of 6600, hemoglobin 15.4,  hematocrit 43.9, platelets 232,000 with a neutrophil count of 63%.  I  have discussed with the wife.  The patient has other labs that are  pending.  The office is to call her and then she will notify the nursing  staff here so that a fax number can be obtained and the rest of the lab  work faxed to the ER apartment.   DIAGNOSTICS:  An ultrasound was done today which again demonstrates  previously mentioned gallstones, one within the neck of the gallbladder,  probably contributing to the patient's biliary colic and pain.  No  evidence of choledocholithiasis or acute cholecystitis.  The patient  also has fatty infiltration of the liver.   IMPRESSION:  1. Symptomatic cholelithiasis and biliary colic.  2. Hypertension, controlled.  3. Dyslipidemia.  4. History of chronic back pain due to degenerative disk disease.   PLAN:  1. Admit the patient to the general surgical floor.  2. Hopefully, we can proceed with operative intervention today, if not      first thing in the morning.  3. N.p.o. until that decision is made.  4. Treat symptoms with IV pain meds and IV antiemetics.  5. Follow up on lab work to make sure the patient does not have      evidence of obstructive process with elevated LFTs or a biliary      pancreatitis.  6. Have Cipro on call to the OR.  7. The patient is ambulatory before admission.  I have not given      Lovenox yet since he may go to the OR this afternoon.  We will      reevaluate need for Lovenox  preoperatively later today.      Allison L. Rennis Harding, N.P.      Lorne Skeens. Hoxworth, M.D.  Electronically Signed    ALE/MEDQ  D:  10/28/2008  T:  10/29/2008  Job:  [id number]   cc:   Ernestina Penna, M.D.

## 2011-01-05 NOTE — Discharge Summary (Signed)
NAME:  David Black, David Black                          ACCOUNT NO.:  [id number]   MEDICAL RECORD NO.:  [id number]                   PATIENT TYPE:  INP   LOCATION:  2012                                 FACILITY:  [facility]   PHYSICIAN:  Willa Rough, M.D.                  DATE OF BIRTH:  09/28/1960   DATE OF ADMISSION:  06/28/2003  DATE OF DISCHARGE:  06/29/2003                                 DISCHARGE SUMMARY   DISCHARGE DIAGNOSES:  1. Chest pain on admission with negative cardiac enzymes and     electrocardiogram which was nondiagnostic.  2. Finding of noncritical coronary artery disease on left heart     catheterization, November, 8, 2004.  The study showed a 30% proximal     stenosis in the first diagonal and a 30% mid point stenosis in a right     coronary artery.  Ejection fraction greater than 55%.  No mitral     regurgitation.  3. Esophagogastroduodenoscopy on November 9th.  A normal study.  Biopsies     were taken at the gastroesophageal junction and are pending at the time     of this discharge.   SECONDARY DIAGNOSES:  1. Adverse lipid profile (see lipo profile results).  2. Hypertension.  3. A strong family history of coronary artery disease.  d  4. Stressful work environment.  5. Status post right inguinal herniorrhaphy in the 1980s.  6. A history of current long-term tobacco habituation, having quit three     weeks ago.  7. Gastroesophageal reflux disease.   PROCEDURES:  1. June 28, 2003:  A left heart catheterization, coronary angiography,     and left ventriculogram.  Studies show that the left main had mild     disease.  The left anterior descending artery had a 30% stenosis     proximally in the first diagonal.  Left circumflex gave rise to two     obtuse marginals with 20-30% stenoses.  The right coronary artery had a     30% mid point stenosis with finding of noncritical coronary artery     disease, pursue medical therapy.  No mitral regurgitation.  Ejection  fraction greater than 55%.  2. June 29, 2003:  Esophagogastroduodenoscopy.  Dr. Lina Sar,     practitioner.  The study was normal.  The biopsies were taken at the     gastroesophageal junction.   DISCHARGE DISPOSITION:  Mr. David Black is ready for discharge on November  9th after undergoing both left heart catheterization and EGD, both without  significant findings.  He has been afebrile this hospitalization.  Vital  signs stable.  Mental status clear.  He has no cardiac dysrhythmias.  No  respiratory distress.  He will go home on the following medications:  Enteric-coated aspirin 325 mg daily, Altace 5 mg daily, Aciphex 20 mg daily  (instead of 20 mg every other day,  now 20 mg daily), Crestor 10 mg daily at  bedtime, metoprolol (a new medicine) 50 mg 1/2 tab in the morning and 1/2  tab in the evening for pain management, Tylenol 325 mg 1-2 tabs every 4-6  hours as needed for pain.  He was asked not to drive nor to engage in sexual  intercourse for the next two days.  He is to avoid heavy lifting for the  next week.  He may shower.  He is to call [facility] at [phone] if  he experiences swelling or increased pain at the catheterization site.  He  has an office visit with Dr. Rollene Rotunda of [facility] at the  [place] office on Wednesday, November 24th at 2:00 in the  afternoon.   BRIEF HISTORY:  Mr. David Black is a 50 year old male referred by his  primary caregiver for a finding of chest pain.  After arriving at work on  the morning of November 8th at about 6:45 a.m., he noticed anterior chest  tightness that did not radiate.  He did not have shortness of breath, nausea  or vomiting; however, he did have diaphoresis.  At about 9:30 in the  morning, he started paying attention to it.  He describes it as a 3/10  with persistence.  He went to the company nurse.  Blood pressure taken by  the company nurse was 140/100.  A call was placed to his  primary caregiver,  and he was referred to the emergency room.  He was given four baby aspirin  at that time and started on sublingual nitroglycerin with reduction of the  pain to 1/10.  He has had no prior recurrences.  He has had no exertional  limitations.  His cardiologist seeing him in the emergency room is Dr. Myrtis Ser.  Primary caregiver is Dr. Christell Constant.  The plan is to admit the patient.  Patient  has significant risk factors, including family history, hypertension, and a  very abnormal lipid profile with a very low HDL and very small LDL particle  size.  He was placed on IV heparin bolus and drip and scheduled for a  catheterization the same day, November 8th.   PROCEDURE:  June 28, 2003:  Left heart catheterization.  The study was  performed by Dr. Veneda Melter.  The findings show left main trunk is a large  caliber vessel with mild irregularities.  The LAD has mild irregularities  proximally in a 10-15% range and a 30% mid point LAD.  The diagonal branch  has a 30% proximal stenosis.  The left circumflex, large caliber, has mild  disease of 20-30% mid section.  The right coronary artery is dominant, a  large caliber vessel.  It has diffuse disease of 30% in the mid and distal  sections.  The LV has an ejection fraction of greater than 55%.  No mitral  regurgitation.  Overall left ventricular function well preserved.   HOSPITAL COURSE:  After admissions through the emergency room at [facility] for initial complaint of chest pain, cardiac enzymes were negative.  Electrocardiogram has nondiagnostic EKG changes.  Patient was essentially  pain free when placed on IV heparin after nitroglycerin and aspirin.  He was  taken the same day to the catheterization lab with finding of noncritical  coronary artery disease.  On November 9th, he was scheduled for EGD, which  was normal, and discharged after the study was finished.  Biopsies were taken at the gastroesophageal  junction.    LABORATORY DATA:  Cardiac enzymes on November 8th at 1300 hours:  CK 233, CK-  MB 1.6.  Troponin I less than 0.01.  On November 8th at 2000 hours:  CK 272,  CK-MB 1.2.  Troponin less than 0.01.  On November 9th at 0120 hours:  CK  236, CK-MB 1.2.  Troponin I less than 0.01.  Complete blood count this  admission:  White cells 6.9, hemoglobin 15.5, hematocrit 45.3, platelets  184.  Serum electrolytes this admission:  Sodium 140, potassium 4.2,  chloride 106, carbonate 27, BUN 11, creatinine 1.1, glucose 99.  Lipid  profile:  LDL particle size 1494.  The LDL size 19.3 nanometers.  The range  is 23 to 20.6.  Large HDL 2 mg per deciliter.  High risk is particle size  HDL less than 11.  The large VLDL is 45 mg per deciliter.  Anything over 27  is a high-risk study.  Total cholesterol is 154, triglycerides 169, HDL  cholesterol 29, LDL cholesterol 92.    DISCHARGE INSTRUCTIONS:  Patient is ready for discharge with the medication  and followup as dictated above.  He is to stay on the proton pump inhibitor  for the next 3-4 weeks, and it would be helpful to get biopsies taken during  endoscopy for followup.      Maple Mirza, P.A.                    Willa Rough, M.D.    GM/MEDQ  D:  06/29/2003  T:  06/30/2003  Job:  [id number]   cc:   Rollene Rotunda, M.D.   Ernestina Penna, M.D.  [address] [place]  [place] [address]  Fax: [phone]   Lina Sar, M.D. [facility]

## 2011-01-05 NOTE — Cardiovascular Report (Signed)
NAME:  David Black, David Black                          ACCOUNT NO.:  [id number]   MEDICAL RECORD NO.:  [id number]                   PATIENT TYPE:  INP   LOCATION:  1825                                 FACILITY:  [facility]   PHYSICIAN:  Veneda Melter, M.D.                   DATE OF BIRTH:  02-13-1961   DATE OF PROCEDURE:  06/28/2003  DATE OF DISCHARGE:                              CARDIAC CATHETERIZATION   PROCEDURES PERFORMED:  1. Left heart catheterization.  2. Left ventriculogram.  3. Selective coronary angiography.  4. Perclose right femoral artery.   DIAGNOSES:  1. Mild coronary artery disease.  2. Normal left ventricular systolic function.   HISTORY:  David Black is a 50 year old gentleman with a history of tobacco use  and hypertension, gastroesophageal reflux disease who presents with  substernal chest discomfort, mild shortness of breath, and diaphoresis.  The  patient had persistence of discomfort.  Presented to the emergency room.  He  was noted to have nonspecific T-wave inversion in the inferolateral leads.  Due to persistence of pain, is referred for further cardiac assessment.   TECHNIQUE:  Informed consent was obtained.  The patient brought to the  catheterization laboratory.  A 6-French sheath placed in the right femoral  artery using modified Seldinger technique.  A 6-French JL4 and JR4 catheter  was then used to engage the left and right coronary arteries and selective  angiography performed in various projections using manual injections of  contrast.  A 6-French pigtail catheter was advanced in the left ventricle  and left ventriculogram performed using power injections of contrast.  At  the termination of this case catheters and sheath were removed and a  Perclose suture closure device deployed to the right femoral artery until  adequate hemostasis achieved.  The patient tolerated procedure well and was  transferred to floor in stable condition.   FINDINGS:  1. Left  main trunk:  Large caliber vessel with mild irregularities.  2. LAD is a large caliber vessel that provides a large diagonal branch in     the proximal segment.  The LAD has mild irregularities of 10-15% in the     proximal segment and 30% in the mid section.  The diagonal branch has     mild disease of 30% in the proximal segment.  3. Left circumflex artery is a large caliber vessel.  Provides a bifurcating     first marginal branch mid section, small second marginal branch distally.     Left circumflex system has mild disease of 20-30% in the mid section.  4. Right coronary artery is dominant.  This is a large caliber vessel.     Provides a posterior descending artery and two posterior ventricular     branches in the terminal segment.  The right coronary artery has diffuse     disease of 30% in the mid and distal sections.  5. LV:  Normal end-systolic and end-diastolic dimensions.  Overall left     ventricular function is well preserved.  Ejection fraction greater than     55%.  No mitral regurgitation.  LV pressure is 120/10.  Aortic is 120/75.     LVEDP equals 30.    ASSESSMENT/PLAN:  David Black is a 50 year old gentleman who presents with  substernal chest discomfort, shortness of breath, and diaphoresis.  The  patient has had relief of discomfort with heparin and nitroglycerin.  It is  unclear what the etiology of his pain is.  Further assessment by our  gastrointestinal colleagues will be pursued.  At present his coronary  disease will be medically managed.                                               Veneda Melter, M.D.    NG/MEDQ  D:  06/28/2003  T:  06/28/2003  Job:  [id number]   cc:   Ernestina Penna, M.D.  [address] [place]  [place] [address]  Fax: [phone]   Rollene Rotunda, M.D.

## 2011-01-05 NOTE — H&P (Signed)
David Black, SHELEY NO.:  [id number]   MEDICAL RECORD NO.:  [id number]          PATIENT TYPE:  EMS   LOCATION:  MAJO                         FACILITY:  [facility]   PHYSICIAN:  Doylene Canning. Ladona Ridgel, MD    DATE OF BIRTH:  [birth year]   DATE OF ADMISSION:  12/10/2006  DATE OF DISCHARGE:                              HISTORY & PHYSICAL   ADMITTING DIAGNOSIS:  Chest pain.   INDICATION FOR ADMISSION:  Evaluation of substernal chest pain.   PRIMARY PHYSICIAN:  Dr. [name].   PRIMARY CARDIOLOGIST:  Dr. Angelina Sheriff.   HISTORY OF PRESENT ILLNESS:  The patient is a 50 year old man who has a  history of chest pain in the past and also has history of hypertension,  dyslipidemia, obesity and a strong family history of coronary disease.  The patient was initially seen in evaluation back in 2004 and underwent  catheterization at that time which demonstrated nonobstructive disease  with 20% to 30% stenoses in the LAD, circumflex and the right coronary  artery.  His LV function was 55%.  The patient was seen by Dr. Antoine Poche  for additional chest pain back earlier in April and at that time,  underwent a repeat stress testing where he walked on a Bruce protocol  for a total of 10-1/2 minutes with the testing being stopped secondary  to fatigue.  There was no chest pain.  There were no EKG changes and his  Myoview scan demonstrated no scar or ischemia.  The patient was in his  usual state of health until earlier today, when he had recurrent  substernal chest pain.  There was no significant radiation, there was no  nausea, there was no vomiting, there was no diaphoresis and there was no  associated shortness of breath.  The pain was nonexertional.  It did not  improve with belching.  Because of this, he was seen by Dr. Christell Constant and  then transferred here for additional evaluation.  He was given  nitroglycerin in Dr. Kathi Der office with minimal improvement of the  pain.  He is admitted  for additional evaluation.   PAST MEDICAL HISTORY:  1. Hypertension.  2. Gastroesophageal reflux disease.  3. Dyslipidemia.  4. Depression.  5. Anxiety.  6. History of right inguinal hernia repair.   SOCIAL HISTORY:  The patient is married and lives in [name].  He has a  54 year old daughter.  He works as a Production designer, theatre/television/film.  He has a history of  tobacco use, but stopped smoking 3 years ago by his report.   FAMILY HISTORY:  Notable for mother with hypertension and father with  coronary artery disease, status post bypass surgery in his early 67s.   REVIEW OF SYSTEMS:  Negative except as noted in the HPI.  He did have  some mild arthritic symptoms in the past.   PHYSICAL EXAM:  GENERAL:  His physical exam is notable for him being a  pleasant well-appearing young man in no distress.  VITAL SIGNS:  The respirations were 18, the blood pressure was 146/77,  the pulse 78 and regular, the temperature  was 98.  HEENT:  Normocephalic  and atraumatic.  Pupils are equal and round.  The oropharynx was moist.  The sclerae were anicteric.  NECK:  Revealed no jugular venous  distention.  There was no thyromegaly.  Trachea was midline.  The  carotid are 2+ symmetric.  LUNGS:  Clear bilaterally to auscultation.  No wheezes, rales or rhonchi  are present.  There is no increased work of breathing.  CARDIOVASCULAR:  Exam revealed a regular rate and rhythm with normal S1 and S2.  I did  not appreciate any murmurs, rubs or gallops.  The PMI was not enlarged  nor was it laterally displaced.  ABDOMEN:  Soft, nontender and nondistended.  There was no organomegaly.  The bowel sounds were present.  There was no rebound or guarding.  EXTREMITIES:  The extremities demonstrated no cyanosis, clubbing or  edema.  The pulses were 2+ and symmetric.  NEUROLOGIC:  Alert and  oriented x3 with cranial nerves intact.  The strength 5/5 and symmetric.   LABORATORY AND ACCESSORY CLINICAL DATA:  His EKG demonstrates sinus   rhythm with some very minimal J-point elevation, but no diagnostic ST-T  wave abnormalities.   His initial labs demonstrate a normal hemoglobin and white blood cell  count.  The BNP was less than 30.  The CK was 320 with a CK-MB of 1.5.  The troponin was 0.02.  A D-dimer was also normal and 0.23.   IMPRESSION:  1. Chest pain with known nonobstructive coronary disease.  2. Dyslipidemia with a history of statin intolerance.  3. Hypertension.  4. Questionable metabolic syndrome.   DISCUSSION:  The patient's symptoms are atypical, but we will plan to  treat him as if he has unstable angina.  If his enzymes return to be  positive, then a catheterization will be warranted; if negative, then  consider a gastroenterology evaluation in light of his recent negative  stress test.      Doylene Canning. Ladona Ridgel, MD  Electronically Signed     GWT/MEDQ  D:  12/10/2006  T:  12/11/2006  Job:  [id number]   cc:   Rollene Rotunda, MD, [facility]  Ernestina Penna, M.D.

## 2011-01-05 NOTE — Assessment & Plan Note (Signed)
[facility]                            CARDIOLOGY OFFICE NOTE   NAME:David Black, David Black                       MRN:          [id number]  DATE:11/26/2006                            DOB:          [birth year]    REASON FOR PRESENTATION:  Evaluate patient with chest pain.   HISTORY OF PRESENT ILLNESS:  Patient is a pleasant 50 year old gentleman  with prior history of nonobstructive coronary artery disease, as  described below.  His catheterization was 2004.  Negative exercise  treadmill test by his report in 2006.  He denies any ongoing symptoms up  until 2 days ago.  He said he was playing softball with his 30 year old  daughter.  He thought he strained a muscle.  However, his muscle aches  seemed to resolve, and then he had this persistent right-sided  discomfort.  It is 2 out of 10 in intensity.  It is there, he thinks,  constantly, though he can forget about it when he is busy.  He describes  it as a tightness.  He denies any nausea, vomiting, or diaphoresis  with this.  He has had no shortness of breath.  He has had no PND or  orthopnea.  He did see his primary care doctor today, and had an EKG  without acute findings.  However, he was given a nitroglycerin and his  chest relaxed.  He said he thought the discomfort went away, although  it recurred, and he is currently having it.  It does not change with  position or inspiration.  He has not had this kind of discomfort before.   PAST MEDICAL HISTORY:  1. Hyperlipidemia.  2. Hypertension.  3. Nonobstructive coronary disease (catheterization November 2004,      left main normal, LAD 10% to 15% proximal and 30% mid stenosis,      diagonal had 30% stenosis, circumflex had 20% to 30% stenosis, the      right coronary artery had 30% stenosis, the EF was 55%).   ALLERGIES:  1. VIOXX.  2. WELLBUTRIN.  3. NIASPAN.   MEDICATIONS:  1. Aspirin 325 mg daily.  2. Welchol.  3. Zetia 10 mg daily.  4. Lexapro  20 mg daily.  5. AcipHex 20 mg daily.   SOCIAL HISTORY:  The patient quit smoking 3 years ago after 1-1/2 packs  per day for 18 years.  He is married.  He has one 9 year old daughter.  He is a Production designer, theatre/television/film.   FAMILY HISTORY:  Is contributory for his father having bypass in his  early 36s.   REVIEW OF SYSTEMS:  As stated in the HPI, and positive for reflux.  Negative for other systems.   PHYSICAL EXAMINATION:  Patient is in no distress.  Blood pressure 117/7/8.  Heart rate 61 and regular.  Weight 235 pounds.  Body mass index 34.  HEENT:  Eyes unremarkable.  Pupils equal, round, and reactive to light.  Fundi not visualized.  Oral mucosa unremarkable.  NECK:  No jugular venous distention.  Wave form within normal limits.  Carotid upstroke brisk and symmetric.  No bruits or  thyromegaly.  LYMPHATICS:  No cervical, axillary, or inguinal adenopathy.  LUNGS:  Clear to auscultation bilaterally.  BACK:  No costovertebral angle tenderness.  CHEST:  Unremarkable.  HEART:  PMI not displaced or sustained.  S1 and S2 within normal limits.  No S3, no S4.  No clicks, no rubs, no murmurs.  ABDOMEN:  Obese.  Positive bowel sounds.  Normal in frequency and pitch.  No bruits, no rebound, no guarding.  No midline pulsatile mass.  No  hepatomegaly.  No splenomegaly.  SKIN:  No rashes.  No nodules.  EXTREMITIES:  Two plus pulses throughout.  No edema.  No cyanosis.  No  clubbing.  NEUROLOGIC:  Oriented to person, place, and time.  Cranial nerves 2  through 12 grossly intact.  Motor grossly intact.   EKG:  Sinus rhythm.  Rate 70.  Axis within normal limits.  Intervals  within normal limits.  No acute ST or T wave changes.   ASSESSMENT AND PLAN:  1. Chest discomfort.  The patient's chest discomfort is predominantly      atypical.  He had nonobstructive disease 4 years ago.  I think the      pretest probability of obstructive coronary disease is moderate at      best.  I do not think catheterization is  warranted, but do think      stress perfusion imaging is.  I have discussed this with the      patient and his wife.  We will try to arrange this as soon as      possible.  In the meantime, he will remain on the medications as      listed, and will be given sublingual nitroglycerin, as this seemed      to help the pain before.  He can take this for exacerbations.  2. Hypertension.  Blood pressure is well controlled.  He will continue      the medications as listed.  3. Risk reduction.  Per Dr. Christell Constant.     Rollene Rotunda, MD, [facility]  Electronically Signed    JH/MedQ  DD: 11/26/2006  DT: 11/26/2006  Job #: [id number]   cc:   Ernestina Penna, M.D.

## 2011-01-05 NOTE — Discharge Summary (Signed)
NAMEOLUSEGUN, GERSTENBERGER                ACCOUNT NO.:  [id number]   MEDICAL RECORD NO.:  [id number]          PATIENT TYPE:  INP   LOCATION:  6529                         FACILITY:  [facility]   PHYSICIAN:  Veverly Fells. Excell Seltzer, MD  DATE OF BIRTH:  1960-09-05   DATE OF ADMISSION:  12/10/2006  DATE OF DISCHARGE:  12/11/2006                         DISCHARGE SUMMARY - REFERRING   DISCHARGE DIAGNOSIS:  1. Reoccurring atypical chest discomfort with negative stress Myoview      on November 27, 2006.  2. Dyslipidemia.  3. Depression and anxiety.  4. Nonobstructive coronary artery disease.  5. Statin intolerance.  6. Obesity.   SUMMARY OF HISTORY:  Mr. Heyward is a 50 year old male who has recently  been evaluated for Dr. Antoine Poche with chest discomfort with an outpatient  stress test that was unremarkable, and a history of a catheterization in  2004 that showed nonobstructive coronary disease.  He presented to the  emergency room complaining of chest discomfort after he was referred by  his primary care physician.  The patient has continued to have chest  discomfort since seeing Dr. Antoine Poche, and since his stress test on November 27, 2006.  The discomfort is nonexertional and does not have any  associated symptoms.   His history is notable for hypertension for which he is not taking any  medications.  He has GERD, hyperlipidemia, depression, anxiety.  Catheterization in November 2004 showed a 10-15% proximal LAD, 30% mid-  LAD, 30% proximal diagonal, 20-to-30% mid circumflex, and 30% mid-and-  distal RCA with an EF of 55%.  Remote tobacco use.   LABORATORY:  Chest x-ray on June 22 showed suboptimal inflation, but  clear, no acute findings.  Admission weight was 106.5 kg.  H&H was 14.7  and 42.3, normal indices, platelets 219, WBC 6.6.  PTT 25, PT 12.5, D-  dimer 0.23.  Sodium 140, potassium 3.9, BUN 11, creatinine 1.10, normal  LFTs.  CK/MBs relative index were negative for myocardial infarction.  Troponins were within normal limits.  BNP was less than 30.  TSH was  1.788.  EKG showed normal sinus rhythm, early R wave, nonspecific ST-T  wave changes.   HOSPITAL COURSE:  The patient was admitted to the hospital and continued  on his home medications.  He was placed on IV heparin overnight.  He did  not have any further chest discomfort.  Enzymes and EKGs were negative  for myocardial infarction.  Dr. Excell Seltzer, after review, felt that in the  setting of his atypical symptoms, recent normal stress test,  nonobstructive coronary artery disease in 2004 and pain resolution; that  he could be discharged home.  His AcipHex was increased to b.i.d.  He  was asked to followup with his primary care physician; and will follow  up with Dr. Antoine Poche as well.   DISPOSITION:  Mr. Guilmette is discharged home.  Activity is not restricted.  He was given permission to return to work on December 12, 2006.   DISCHARGE MEDICATIONS:  He is asked to continue his home medications  which include:  1. Aspirin 325 mg daily.  2.  Zetia 10 mg daily.  3. Lexapro 20 daily.  4. Welchol as previously.  5. Fish oil twice a day.  6. His AcipHex was increased to 20 mg b.i.d. for 2 weeks, and then      asked to resume daily.   FOLLOWUP:  1. He was asked to call to arrange a 1-to-2 week follow-up appointment      with Dr. Christell Constant.  2. He will see Dr. Antoine Poche on Jan 01, 2007 at 11:45.  He was asked to      bring all medications to all appointments.   DISCHARGE TIME:  30 minutes.      Joellyn Rued, PA-C      Veverly Fells. Excell Seltzer, MD  Electronically Signed    EW/MEDQ  D:  12/11/2006  T:  12/11/2006  Job:  [id number]   cc:   Rollene Rotunda, MD, [facility]  Ernestina Penna, M.D.

## 2011-01-05 NOTE — Op Note (Signed)
NAME:  David Black, David Black                          ACCOUNT NO.:  [id number]   MEDICAL RECORD NO.:  [id number]                   PATIENT TYPE:  INP   LOCATION:  2012                                 FACILITY:  [facility]   PHYSICIAN:  Lina Sar, M.D. [facility]               DATE OF BIRTH:  1961/04/16   DATE OF PROCEDURE:  06/29/2003  DATE OF DISCHARGE:                                 OPERATIVE REPORT   NAME OF PROCEDURE:  Upper endoscopy.   INDICATIONS:  This 50 year old white male was admitted yesterday on an  emergency basis with substernal and subxiphoid chest pain.  He was being  ruled out after cardiac catheterization showed no significant coronary  artery disease.  The patient has been on Bextra and aspirin.  He denies any  heartburn or dysphagia.  He is undergoing upper endoscopy before discharge  to assess him for possible Barrett's esophagus.   ENDOSCOPE:  Olympus single-chamber video endoscope.   SEDATION:  1. Versed 10 mg IV.  2. Fentanyl 100 mcg IV.   FINDINGS:  An Olympus single-chamber video endoscope was passed through the  posterior pharynx into the esophagus.  The patient was monitored by pulse  oximeter.  His oxygen saturations were normal.  He was cooperative.  The  proximate and mid esophageal mucosa was normal.  Squamocolumnar junction at  40 cm from the incisors showed essentially smooth transition at Z-line.  There was only one short tunnel of gastric mucosa which measured about 1 cm  protruding into the distal esophagus.  This was biopsied to rule out short  segment Barrett's esophagus.  There was no stricture and no hiatal hernia.   The stomach was insufflated with air and it showed normal gastric folds,  body of the stomach, and gastric antrum.  Retroflexion of endoscope revealed  normal fundus and cardia.  The pyloric outlet was normal.  The descending  duodenum and duodenal bulb were unremarkable.  Endoscope was then retracted  and stomach decompressed.  The  patient tolerated the procedure well.   IMPRESSION:  Essentially normal upper endoscopy involving the stomach and  duodenum, status post biopsy from gastroesophageal (GE) junction to rule out  short segment Barrett's esophagus.   PLAN:  The patient will be discharged today.  His chest pain is possibly  related to an esophageal spasm or perhaps it is due to reflux.  He has not  had any pain since his hospitalization.  He will be advised to continue on  Protonix 40 mg a day for another two to three weeks, and take it on a p.r.n.  basis.                                               Lina Sar, M.D. [facility]  DB/MEDQ  D:  06/29/2003  T:  06/29/2003  Job:  [id number]

## 2011-01-05 NOTE — H&P (Signed)
NAME:  David Black, David Black NO.:  [id number]   MEDICAL RECORD NO.:  [id number]                   PATIENT TYPE:  EMS   LOCATION:  MAJO                                 FACILITY:  [facility]   PHYSICIAN:  Willa Rough, M.D.                  DATE OF BIRTH:  29-Jan-1961   DATE OF ADMISSION:  06/28/2003  DATE OF DISCHARGE:                                HISTORY & PHYSICAL   HISTORY OF PRESENT ILLNESS:  David Black is a very pleasant 50 year old  gentleman.  He is the husband of David Black who is a nurse in Dr. Roe Coombs  Moore's office.  The patient has not had significant prior chest symptoms.  He does have significant risk factors for coronary disease.  His father had  CABG at a relatively young age.  The patient has hypertension.  The patient  stopped smoking three weeks ago.  He has very abnormal lipids with a very  low HDL and on a LipoMed analysis a very small HDL size.   The patient noticed chest tightness this morning at work and then had some  diaphoresis.  He was taken to Dr. Langston Masker and then sent to [place] for further  evaluation.  He did receive sublingual nitroglycerin.  This gave him a  headache.  It is not clear necessarily that it helped the pain but he has  some tightness at the level of 1 at this time.   ALLERGIES:  The patient has difficulty taking Niaspan.  He has had a rash  for Wellbutrin.   MEDICATIONS:  1. Altace 5 mg daily.  2. Aciphex 20 mg daily every other day.  3. Aspirin 325 mg.  4. Crestor 10 mg daily.   OTHER MEDICAL PROBLEMS:  See the complete list below.   SOCIAL HISTORY:  The patient lives in [place] with his wife.  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  He stopped smoking about three weeks ago.   FAMILY HISTORY:  The patient's father has had CABG, and the patient's father  is 60 years old.   REVIEW OF SYSTEMS:  The patient has not had any fevers.  He did have some  diaphoresis today.  He has had no HEENT problems.  There have  been no skin  problems.  Until today, he had no shortness of breath or chest pain but he  did have chest tightness today.  He does have some nocturia.  He has had no  major musculoskeletal problems.  He does have symptoms of GERD treated with  Aciphex.  The remainder of his review of systems is negative.   PHYSICAL EXAMINATION:  VITAL SIGNS:  Temperature is 97.9, pulse is 71,  respirations 18, blood pressure 110/64.  GENERAL:  The patient appears quite stable at this time.  HEENT:  No marked abnormalities.  NECK:  Normal.  CARDIAC:  S1 with an  S2 but no clicks or significant murmurs.  ABDOMEN:  Benign.  GENITOURINARY/RECTAL:  Deferred.  EXTREMITIES:  No edema.  There are no musculoskeletal deformities.  NEUROLOGIC:  Grossly intact.   LABORATORY DATA:  EKG shows no acute change.  Chest x-ray is done but is  pending.  Blood studies show that his hemoglobin is 15.4.  The remainder of  his blood work is pending.   IMPRESSION:  1. Hyperlipidemia that is significant with a significantly low HDL.  2. History of hypertension.  3. Gastroesophageal reflux disease.  4. Difficulty taking Niaspan.  5. Smoking that he stopped three weeks ago.  6. Strong family history of coronary disease.  7. * New chest tightness with some diaphoresis today.   PLAN:  At this point, labs are pending but the patient has significant risk  factors, and I am quite concerned about the possibility of this being an  acute coronary syndrome.  He has been given aspirin, and heparin has been  started.  We will proceed to the catheterization lab today because he has  very slight continued discomfort.  I have fully discussed all of these  issues with the patient and his wife in person and they are in agreement.                                                Willa Rough, M.D.    Cleotis Lema  D:  06/28/2003  T:  06/28/2003  Job:  [id number]   cc:   Ernestina Penna, M.D.  [address] [place]  [place] [address]  Fax:  [phone]   Rollene Rotunda, M.D.

## 2011-03-29 ENCOUNTER — Encounter: Payer: Self-pay | Admitting: Cardiology

## 2011-05-29 LAB — COMPREHENSIVE METABOLIC PANEL
Alkaline Phosphatase: 23 — ABNORMAL LOW
BUN: 11
Chloride: 106
Creatinine, Ser: 1.11
Glucose, Bld: 98
Potassium: 3.9
Total Bilirubin: 0.9
Total Protein: 6.9

## 2011-07-14 IMAGING — CT CT ANGIO CHEST
2 of 6 series · 19 of 36 positions shown · IV contrast (APPLIED)
Comparison: None.

CLINICAL DATA: Shortness of breath and chest pain

CT ANGIOGRAPHY CHEST WITH CONTRAST
TECHNIQUE: Multidetector CT imaging of the chest was performed
using the standard protocol during bolus administration of
intravenous contrast.  Multiplanar CT image reconstructions
including MIPs were obtained to evaluate the vascular anatomy.
Contrast:  100 ml of Omnipaque 350

[Series 8: pulm embolism 1.0 b25f thins · axial · 0.83mm/px · z∈[+1623,+1880]mm · 18 of 287 slices shown]
[im 15/287  lung]
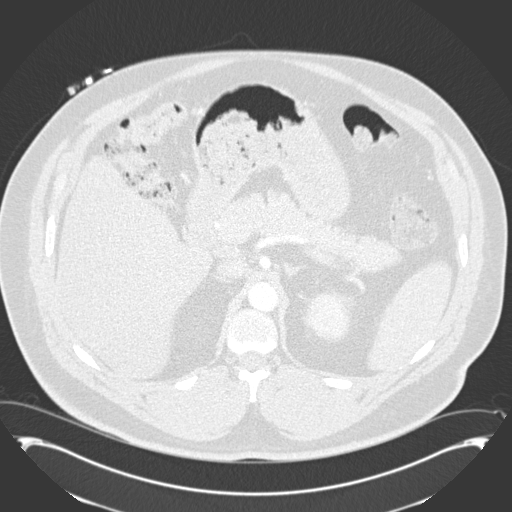
[im 29/287  mediastinal]
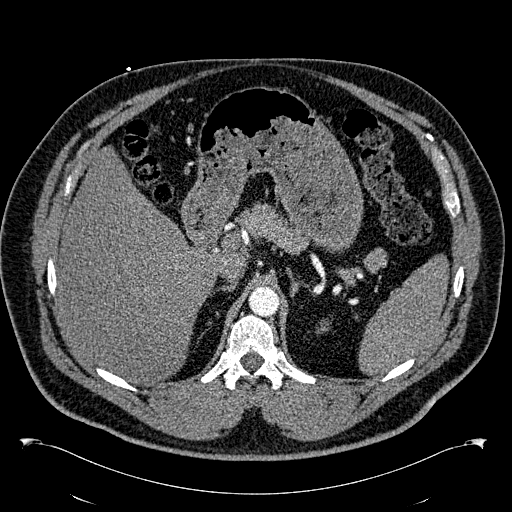
[im 43/287  lung]
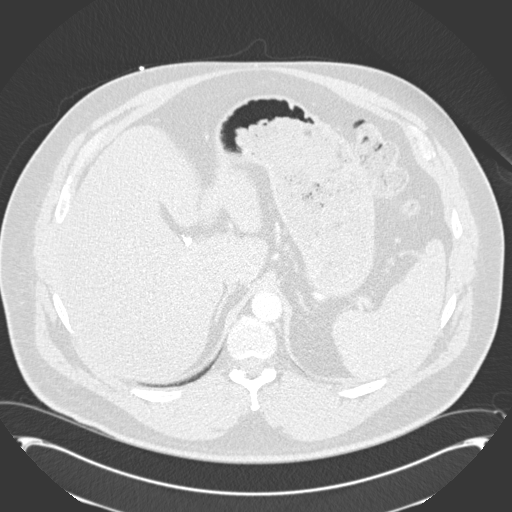
[im 58/287  mediastinal]
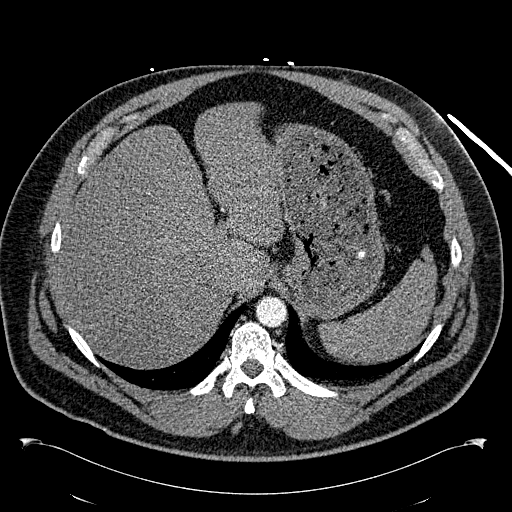
[im 72/287  lung]
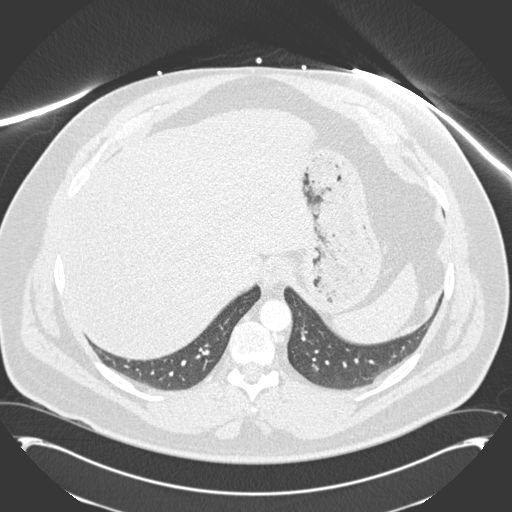
[im 86/287  mediastinal]
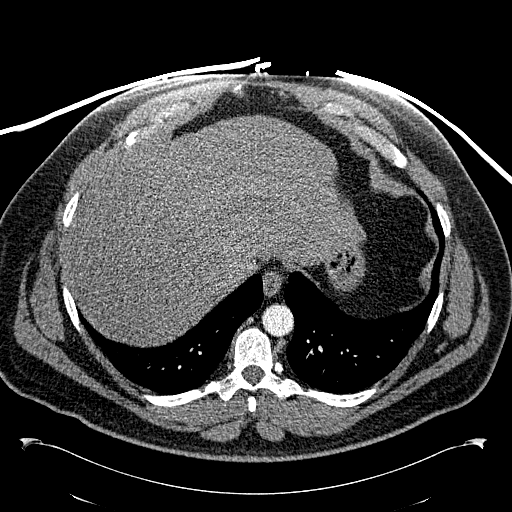
[im 101/287  lung]
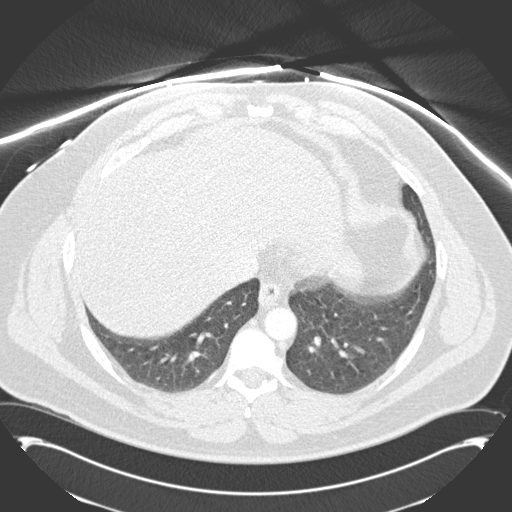
[im 115/287  mediastinal]
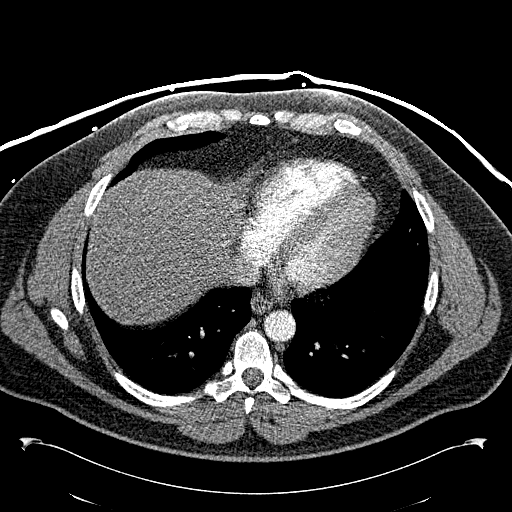
[im 129/287  lung]
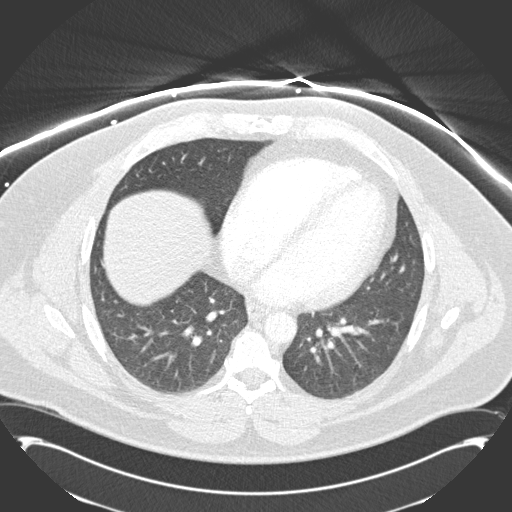
[im 158/287  mediastinal]
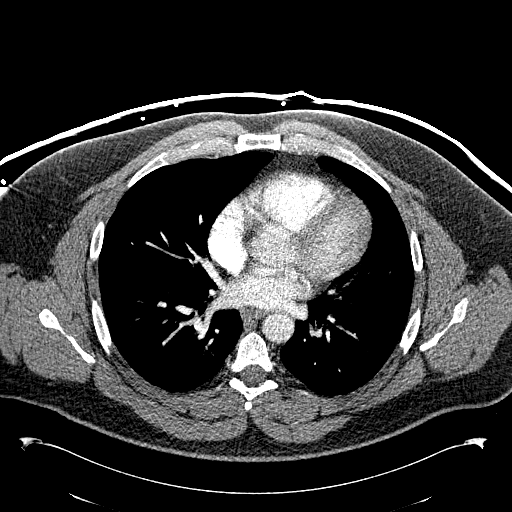
[im 172/287  lung]
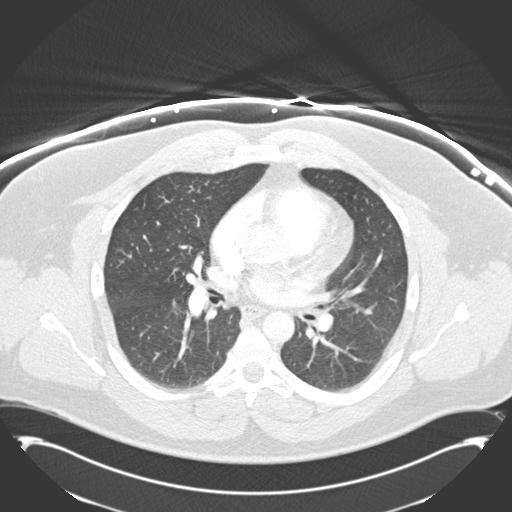
[im 186/287  mediastinal]
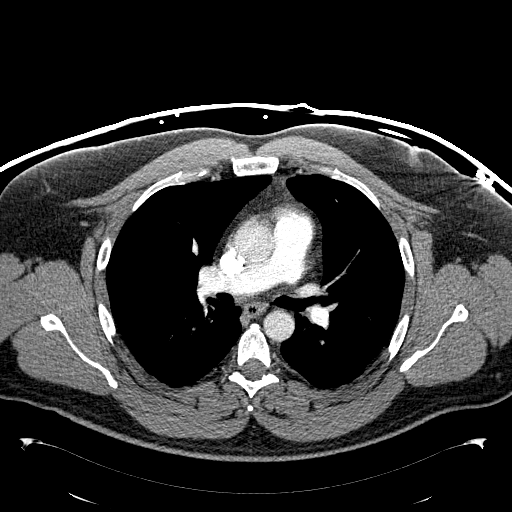
[im 201/287  lung]
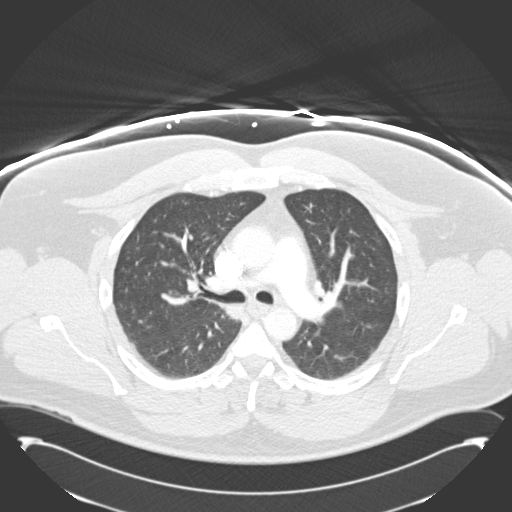
[im 215/287  mediastinal]
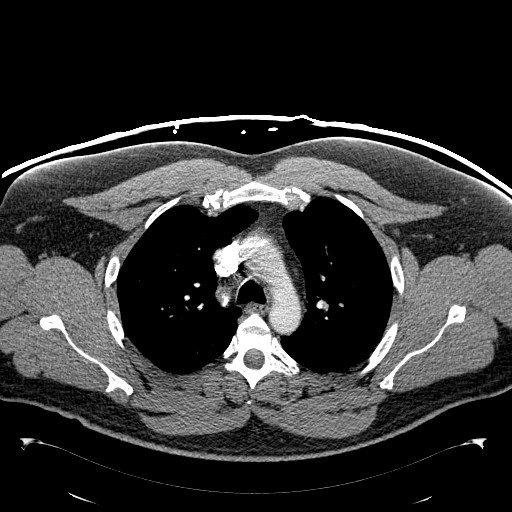
[im 229/287  lung]
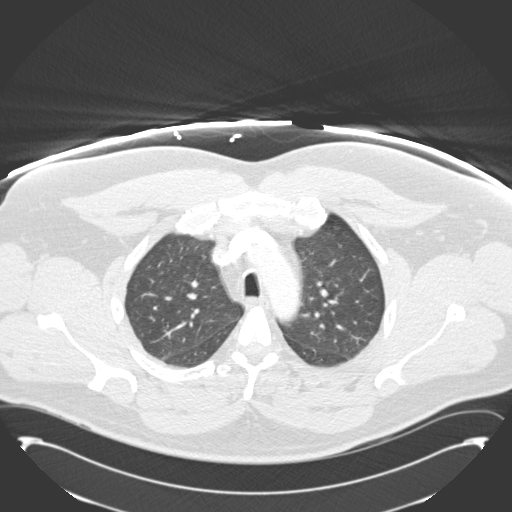
[im 244/287  mediastinal]
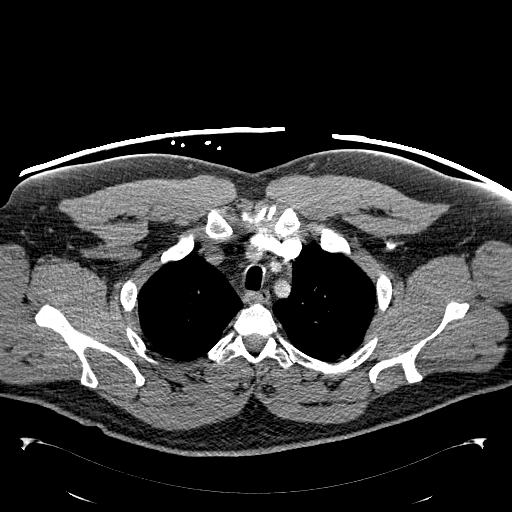
[im 258/287  lung]
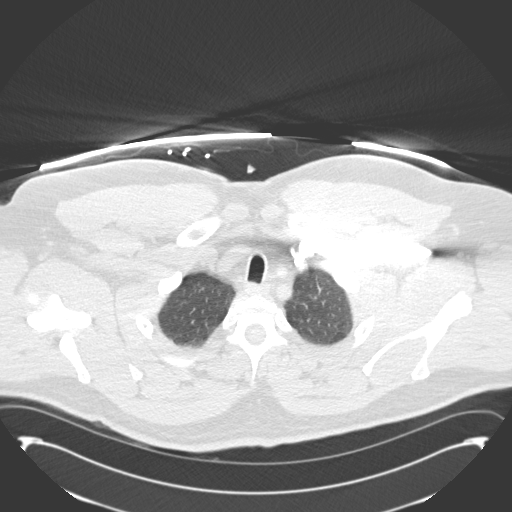
[im 272/287  mediastinal]
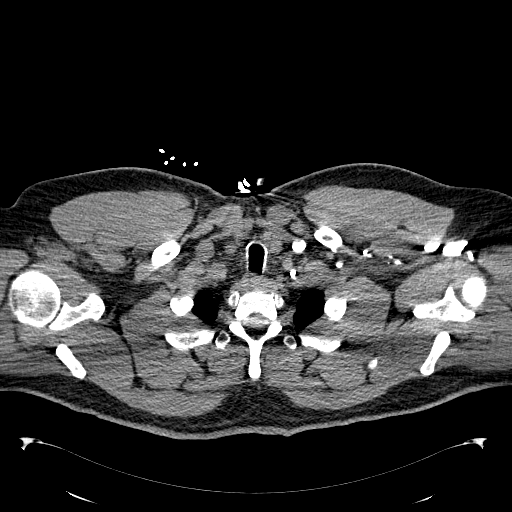

[Series 605: <mpr thick cor · coronal · 0.83mm/px · 1 of 130 slices shown]
[im 65/130  mediastinal]
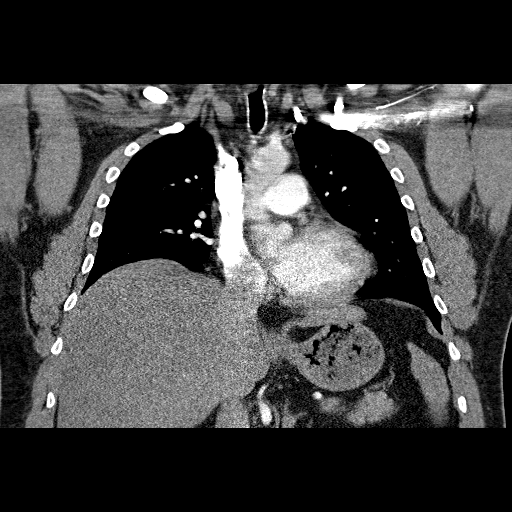

[19 of 36 positions shown; findings below may reference images not displayed]

FINDINGS: There is suboptimal opacification of the subsegmental
pulmonary arteries to the upper lobes.  However, no central,
segmental or subsegmental pulmonary emboli are seen in the
remainder the lungs.  The pulmonary artery is normal in caliber.
The heart is mildly enlarged.  The aorta and major branch vessels
are patent.  The visualized thyroid is normal.  There is no
mediastinal adenopathy identified.  The trachea and central airways
are patent.  Respiratory motion throughout examination limits
evaluation for small pulmonary nodules.  There is a 2 mm pulmonary
nodule in the right upper lobe on series 5, image 32.  Several
subpleural lymph nodes are seen along the right major fissure.
There are no pleural effusions or areas of consolidation on today's
exam.  Mild hepatic steatosis is seen.  The patient has had a
cholecystectomy.  The upper abdomen is otherwise normal.  The
osseous structures are within normal limits.

Review of the MIP images confirms the above findings.
IMPRESSION: No filling defects in the visualized pulmonary arteries as detailed
above.  No acute findings in the thorax.

## 2011-07-14 IMAGING — CR DG CHEST 2V
2 series · 2 of 2 positions shown · non-contrast
Comparison: 06/30/2010

CLINICAL DATA: Shortness of breath and chest pain

CHEST - 2 VIEW

[w chest pa]
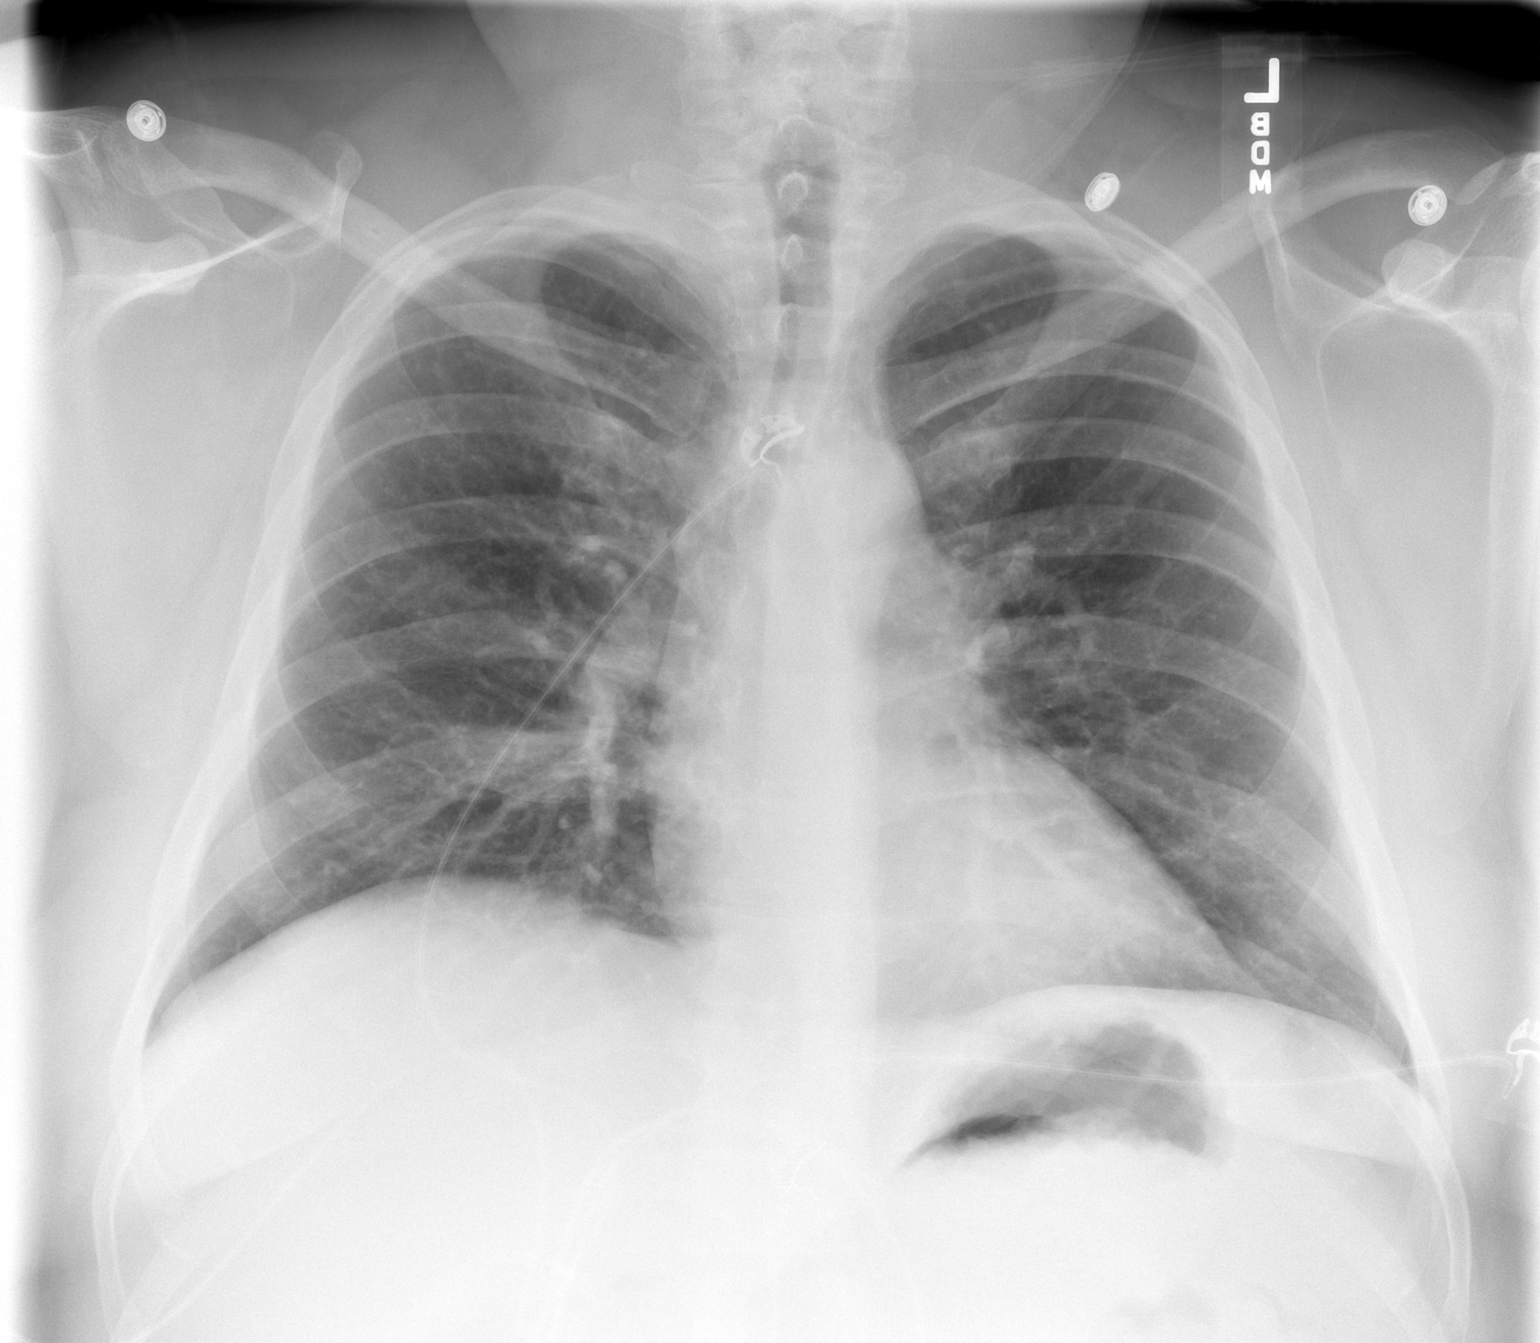

[w chest lat]
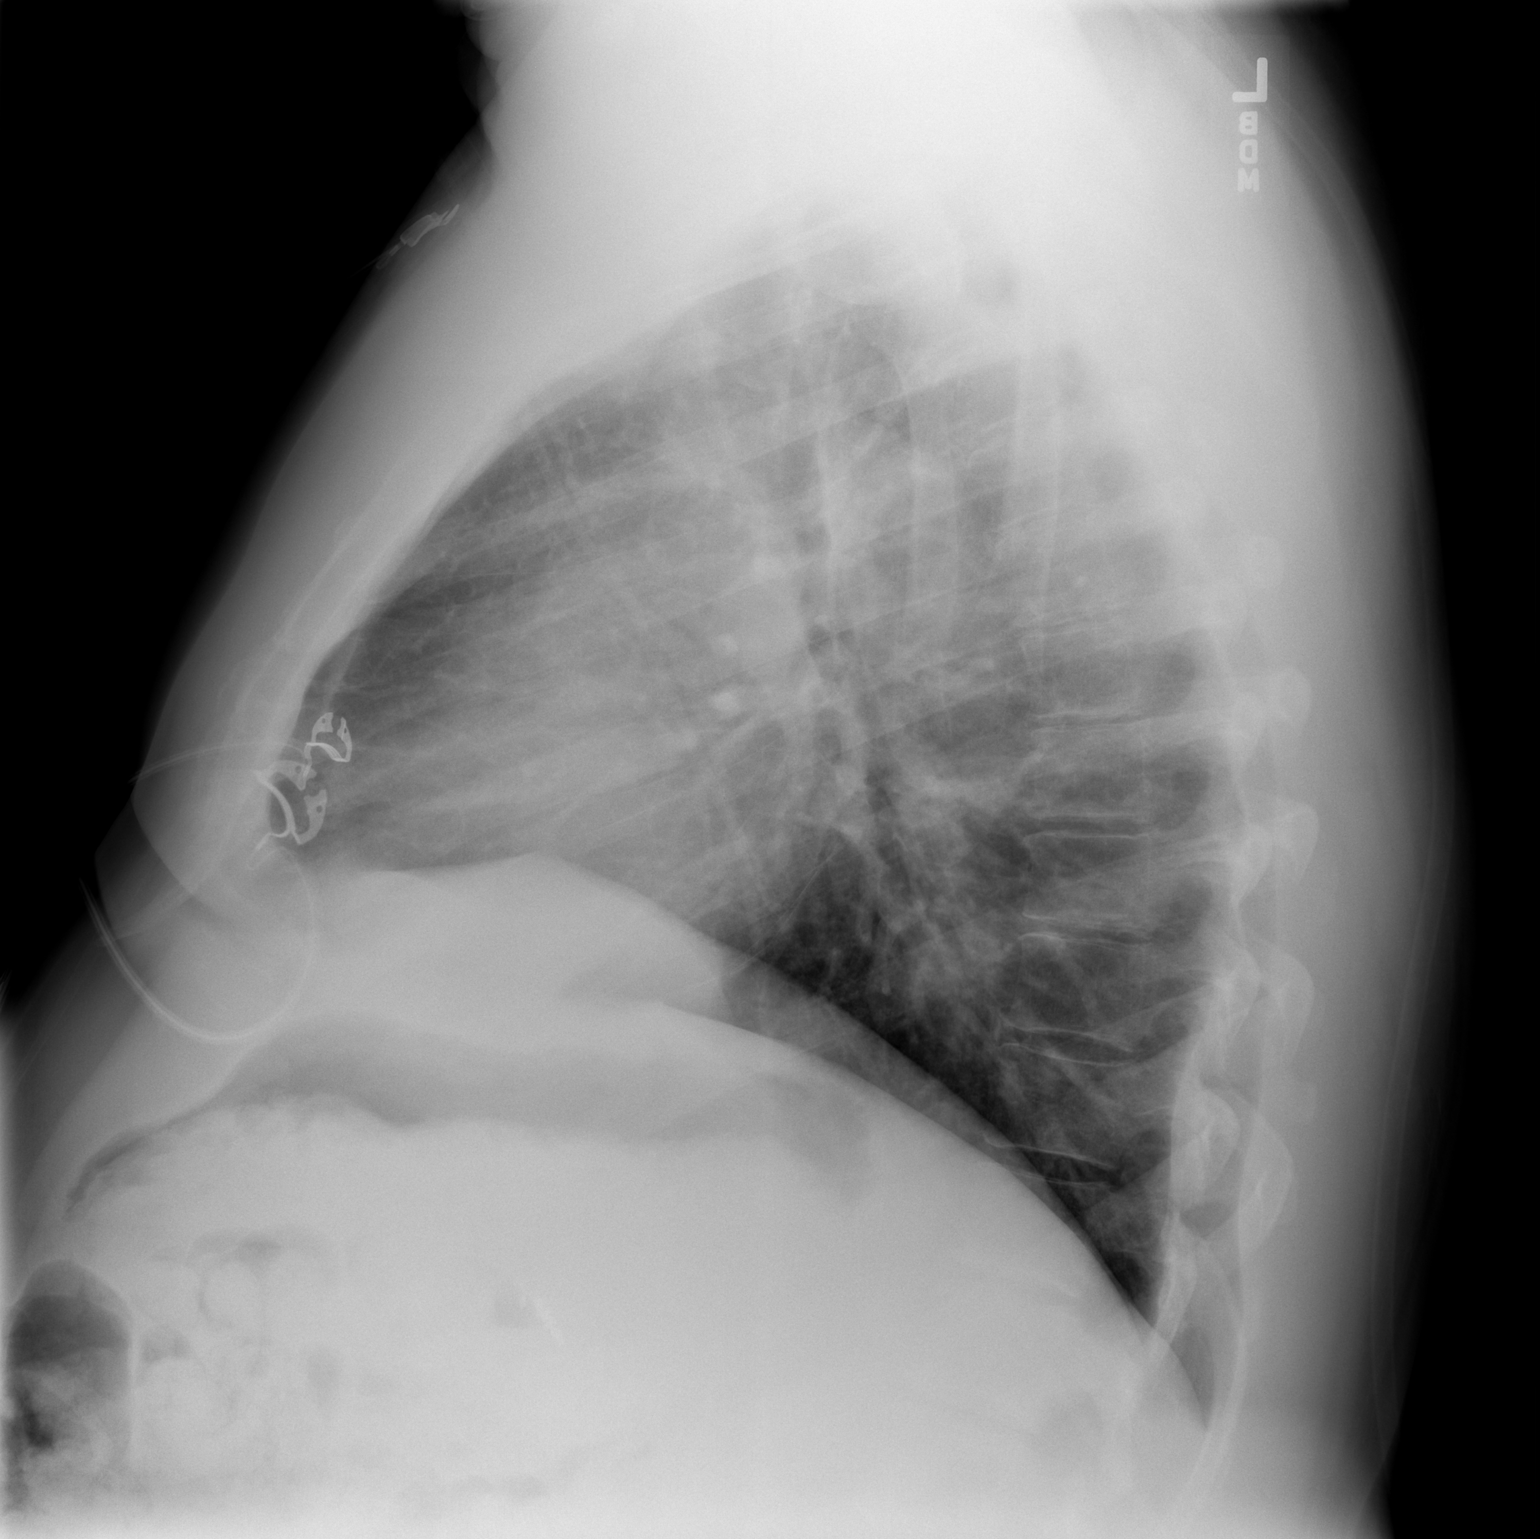

[2 of 2 positions shown; findings below may reference images not displayed]

FINDINGS: The lungs are clear bilaterally.  No confluent airspace
opacities, pleural effuions or pneumothoracies are seen.  The heart
is normal in size and contour.  The upper abdomen and osseous
structures are normal.
IMPRESSION: No acute cardiopulmonary disease.

## 2011-10-08 ENCOUNTER — Encounter: Payer: Self-pay | Admitting: Gastroenterology

## 2012-03-25 ENCOUNTER — Telehealth: Payer: Self-pay | Admitting: Critical Care Medicine

## 2012-03-25 NOTE — Telephone Encounter (Signed)
Called pt to schedule follow up apt x3.Sent letter 03/17/12. ° °

## 2012-05-22 ENCOUNTER — Other Ambulatory Visit: Payer: Self-pay

## 2012-12-02 ENCOUNTER — Other Ambulatory Visit: Payer: Self-pay

## 2012-12-02 MED ORDER — VALSARTAN-HYDROCHLOROTHIAZIDE 320-25 MG PO TABS: ORAL_TABLET | ORAL | Status: DC

## 2013-01-15 ENCOUNTER — Other Ambulatory Visit: Payer: Self-pay

## 2013-01-20 ENCOUNTER — Telehealth: Payer: Self-pay | Admitting: *Deleted

## 2013-01-20 NOTE — Telephone Encounter (Signed)
Where- Has he had gout in the past

## 2013-01-20 NOTE — Telephone Encounter (Signed)
Trig and LDL mildly elevated- Low fat diet and exercise. Uric acid level elevated-Having any joint pain?

## 2013-01-20 NOTE — Telephone Encounter (Signed)
Please review labs that were done through work.  They have been scanned in.  Thanks!

## 2013-01-20 NOTE — Telephone Encounter (Signed)
Yes he is having joint pain.

## 2013-01-22 MED ORDER — INDOMETHACIN 50 MG PO CAPS: 50.0000 mg | ORAL_CAPSULE | Freq: Two times a day (BID) | ORAL | Status: DC

## 2013-01-22 NOTE — Telephone Encounter (Signed)
Never treated for gout but having a lot of wrist and knee pain

## 2013-01-22 NOTE — Telephone Encounter (Signed)
Indomethacin rx sent o pharmacy- only take when needed

## 2013-01-22 NOTE — Telephone Encounter (Signed)
Spoke with patient's wife.

## 2013-03-10 ENCOUNTER — Other Ambulatory Visit: Payer: Self-pay | Admitting: *Deleted

## 2013-03-10 MED ORDER — INDOMETHACIN 50 MG PO CAPS: 50.0000 mg | ORAL_CAPSULE | Freq: Two times a day (BID) | ORAL | Status: DC

## 2013-03-10 NOTE — Telephone Encounter (Signed)
Pt aware, refills done.

## 2013-03-10 NOTE — Telephone Encounter (Signed)
MMM started patient on med but didn't give refills. He is better with meds. Should he continue? If so please refill. Thank you

## 2013-03-10 NOTE — Telephone Encounter (Signed)
Many refill of medication x1. This is pretty potent medicine and can have adverse effects on the liver and the kidney. Only one refill.

## 2013-04-08 ENCOUNTER — Other Ambulatory Visit: Payer: Self-pay | Admitting: *Deleted

## 2013-04-08 MED ORDER — GLIMEPIRIDE 4 MG PO TABS: 4.0000 mg | ORAL_TABLET | Freq: Every day | ORAL | Status: DC

## 2013-04-11 ENCOUNTER — Ambulatory Visit (INDEPENDENT_AMBULATORY_CARE_PROVIDER_SITE_OTHER): Payer: 59 | Admitting: Family Medicine

## 2013-04-11 VITALS — BP 134/87 | HR 60 | Temp 97.5°F | Ht 69.0 in

## 2013-04-11 DIAGNOSIS — M25562 Pain in left knee: Secondary | ICD-10-CM

## 2013-04-11 DIAGNOSIS — M25569 Pain in unspecified knee: Secondary | ICD-10-CM

## 2013-04-11 MED ORDER — HYDROCODONE-ACETAMINOPHEN 5-325 MG PO TABS: 1.0000 | ORAL_TABLET | Freq: Four times a day (QID) | ORAL | Status: DC | PRN

## 2013-04-13 ENCOUNTER — Other Ambulatory Visit: Payer: Self-pay | Admitting: Family Medicine

## 2013-04-13 ENCOUNTER — Ambulatory Visit (INDEPENDENT_AMBULATORY_CARE_PROVIDER_SITE_OTHER): Payer: 59

## 2013-04-13 ENCOUNTER — Other Ambulatory Visit: Payer: 59

## 2013-04-13 ENCOUNTER — Ambulatory Visit: Payer: 59

## 2013-04-13 DIAGNOSIS — R52 Pain, unspecified: Secondary | ICD-10-CM

## 2013-04-13 DIAGNOSIS — M25569 Pain in unspecified knee: Secondary | ICD-10-CM

## 2013-04-13 DIAGNOSIS — S8992XS Unspecified injury of left lower leg, sequela: Secondary | ICD-10-CM

## 2013-04-13 NOTE — Patient Instructions (Signed)
Knee Pain  The knee is the complex joint between your thigh and your lower leg. It is made up of bones, tendons, ligaments, and cartilage. The bones that make up the knee are:   The femur in the thigh.   The tibia and fibula in the lower leg.   The patella or kneecap riding in the groove on the lower femur.  CAUSES   Knee pain is a common complaint with many causes. A few of these causes are:   Injury, such as:   A ruptured ligament or tendon injury.   Torn cartilage.   Medical conditions, such as:   Gout   Arthritis   Infections   Overuse, over training or overdoing a physical activity.  Knee pain can be minor or severe. Knee pain can accompany debilitating injury. Minor knee problems often respond well to self-care measures or get well on their own. More serious injuries may need medical intervention or even surgery.  SYMPTOMS  The knee is complex. Symptoms of knee problems can vary widely. Some of the problems are:   Pain with movement and weight bearing.   Swelling and tenderness.   Buckling of the knee.   Inability to straighten or extend your knee.   Your knee locks and you cannot straighten it.   Warmth and redness with pain and fever.   Deformity or dislocation of the kneecap.  DIAGNOSIS   Determining what is wrong may be very straight forward such as when there is an injury. It can also be challenging because of the complexity of the knee. Tests to make a diagnosis may include:   Your caregiver taking a history and doing a physical exam.   Routine X-rays can be used to rule out other problems. X-rays will not reveal a cartilage tear. Some injuries of the knee can be diagnosed by:   Arthroscopy a surgical technique by which a small video camera is inserted through tiny incisions on the sides of the knee. This procedure is used to examine and repair internal knee joint problems. Tiny instruments can be used during arthroscopy to repair the torn knee cartilage (meniscus).   Arthrography  is a radiology technique. A contrast liquid is directly injected into the knee joint. Internal structures of the knee joint then become visible on X-ray film.   An MRI scan is a non x-ray radiology procedure in which magnetic fields and a computer produce two- or three-dimensional images of the inside of the knee. Cartilage tears are often visible using an MRI scanner. MRI scans have largely replaced arthrography in diagnosing cartilage tears of the knee.   Blood work.   Examination of the fluid that helps to lubricate the knee joint (synovial fluid). This is done by taking a sample out using a needle and a syringe.  TREATMENT  The treatment of knee problems depends on the cause. Some of these treatments are:   Depending on the injury, proper casting, splinting, surgery or physical therapy care will be needed.   Give yourself adequate recovery time. Do not overuse your joints. If you begin to get sore during workout routines, back off. Slow down or do fewer repetitions.   For repetitive activities such as cycling or running, maintain your strength and nutrition.   Alternate muscle groups. For example if you are a weight lifter, work the upper body on one day and the lower body the next.   Either tight or weak muscles do not give the proper support for your   knee. Tight or weak muscles do not absorb the stress placed on the knee joint. Keep the muscles surrounding the knee strong.   Take care of mechanical problems.   If you have flat feet, orthotics or special shoes may help. See your caregiver if you need help.   Arch supports, sometimes with wedges on the inner or outer aspect of the heel, can help. These can shift pressure away from the side of the knee most bothered by osteoarthritis.   A brace called an "unloader" brace also may be used to help ease the pressure on the most arthritic side of the knee.   If your caregiver has prescribed crutches, braces, wraps or ice, use as directed. The acronym for  this is PRICE. This means protection, rest, ice, compression and elevation.   Nonsteroidal anti-inflammatory drugs (NSAID's), can help relieve pain. But if taken immediately after an injury, they may actually increase swelling. Take NSAID's with food in your stomach. Stop them if you develop stomach problems. Do not take these if you have a history of ulcers, stomach pain or bleeding from the bowel. Do not take without your caregiver's approval if you have problems with fluid retention, heart failure, or kidney problems.   For ongoing knee problems, physical therapy may be helpful.   Glucosamine and chondroitin are over-the-counter dietary supplements. Both may help relieve the pain of osteoarthritis in the knee. These medicines are different from the usual anti-inflammatory drugs. Glucosamine may decrease the rate of cartilage destruction.   Injections of a corticosteroid drug into your knee joint may help reduce the symptoms of an arthritis flare-up. They may provide pain relief that lasts a few months. You may have to wait a few months between injections. The injections do have a small increased risk of infection, water retention and elevated blood sugar levels.   Hyaluronic acid injected into damaged joints may ease pain and provide lubrication. These injections may work by reducing inflammation. A series of shots may give relief for as long as 6 months.   Topical painkillers. Applying certain ointments to your skin may help relieve the pain and stiffness of osteoarthritis. Ask your pharmacist for suggestions. Many over the-counter products are approved for temporary relief of arthritis pain.   In some countries, doctors often prescribe topical NSAID's for relief of chronic conditions such as arthritis and tendinitis. A review of treatment with NSAID creams found that they worked as well as oral medications but without the serious side effects.  PREVENTION   Maintain a healthy weight. Extra pounds put  more strain on your joints.   Get strong, stay limber. Weak muscles are a common cause of knee injuries. Stretching is important. Include flexibility exercises in your workouts.   Be smart about exercise. If you have osteoarthritis, chronic knee pain or recurring injuries, you may need to change the way you exercise. This does not mean you have to stop being active. If your knees ache after jogging or playing basketball, consider switching to swimming, water aerobics or other low-impact activities, at least for a few days a week. Sometimes limiting high-impact activities will provide relief.   Make sure your shoes fit well. Choose footwear that is right for your sport.   Protect your knees. Use the proper gear for knee-sensitive activities. Use kneepads when playing volleyball or laying carpet. Buckle your seat belt every time you drive. Most shattered kneecaps occur in car accidents.   Rest when you are tired.  SEEK MEDICAL CARE IF:     You have knee pain that is continual and does not seem to be getting better.   SEEK IMMEDIATE MEDICAL CARE IF:   Your knee joint feels hot to the touch and you have a high fever.  MAKE SURE YOU:    Understand these instructions.   Will watch your condition.   Will get help right away if you are not doing well or get worse.  Document Released: 06/03/2007 Document Revised: 10/29/2011 Document Reviewed: 06/03/2007  [employer] Patient Information 2014 [employer].

## 2013-04-13 NOTE — Progress Notes (Signed)
  Subjective:    Patient ID: David Black, male    DOB: Jan 12, 1961, 52 y.o.   MRN: [id number]  HPI This 52 y.o. male presents for evaluation of left knee pain.  He was out playing golf and Teeing off on the first hole when he swang his club and felt a pop and tear in his left knee And he has been having difficulty bearing weight and is in severe pain. He has been using crutch To help bear weight.   Review of Systems. C/o left knee pain No chest pain, SOB, HA, dizziness, vision change, N/V, diarrhea, constipation, dysuria, urinary urgency or frequency, or rash.     Objective:   Physical Exam Vital signs noted  Well developed well nourished male.  HEENT - Head atraumatic Normocephalic                Eyes - PERRLA, Conjuctiva - clear Sclera- Clear EOMI                Ears - EAC's Wnl TM's Wnl Gross Hearing WNL                Nose - Nares patent                 Throat - oropharanx wnl Respiratory - Lungs CTA bilateral Cardiac - RRR S1 and S2 without murmur GI - Abdomen soft Nontender and bowel sounds active x 4 MS - TTP left knee with pain with ROM.  Swelling left knee. Extremities - No edema. Neuro - Grossly intact.       Assessment & Plan:  Knee pain, left - Plan: HYDROcodone-acetaminophen (NORCO) 5-325 MG per tablet, Ambulatory referral to Orthopedic Surgery, CANCELED: DG Knee 1-2 Views Left

## 2013-06-04 ENCOUNTER — Other Ambulatory Visit: Payer: Self-pay

## 2013-06-04 MED ORDER — VALSARTAN-HYDROCHLOROTHIAZIDE 320-25 MG PO TABS: ORAL_TABLET | ORAL | Status: DC

## 2013-06-09 ENCOUNTER — Ambulatory Visit: Payer: 59

## 2013-08-05 ENCOUNTER — Ambulatory Visit (INDEPENDENT_AMBULATORY_CARE_PROVIDER_SITE_OTHER): Payer: 59 | Admitting: Family Medicine

## 2013-08-05 ENCOUNTER — Encounter (INDEPENDENT_AMBULATORY_CARE_PROVIDER_SITE_OTHER): Payer: Self-pay

## 2013-08-05 ENCOUNTER — Encounter: Payer: Self-pay | Admitting: Family Medicine

## 2013-08-05 ENCOUNTER — Ambulatory Visit (INDEPENDENT_AMBULATORY_CARE_PROVIDER_SITE_OTHER): Payer: 59

## 2013-08-05 VITALS — BP 142/89 | HR 99 | Temp 97.8°F | Ht 69.0 in | Wt 281.9 lb

## 2013-08-05 DIAGNOSIS — Z0000 Encounter for general adult medical examination without abnormal findings: Secondary | ICD-10-CM

## 2013-08-05 DIAGNOSIS — E119 Type 2 diabetes mellitus without complications: Secondary | ICD-10-CM

## 2013-08-05 LAB — POCT GLYCOSYLATED HEMOGLOBIN (HGB A1C): Hemoglobin A1C: 6.7

## 2013-08-06 ENCOUNTER — Other Ambulatory Visit: Payer: Self-pay | Admitting: Nurse Practitioner

## 2013-08-06 DIAGNOSIS — R9389 Abnormal findings on diagnostic imaging of other specified body structures: Secondary | ICD-10-CM

## 2013-08-06 MED ORDER — GABAPENTIN 300 MG PO CAPS: 300.0000 mg | ORAL_CAPSULE | Freq: Three times a day (TID) | ORAL | Status: DC

## 2013-08-06 NOTE — Progress Notes (Signed)
   Subjective:    Patient ID: David Black, male    DOB: [birth year], 52 y.o.   MRN: [id number]  HPI This 53 y.o. male presents for evaluation of CPE.  He has hx of DM and he is having numbness In his feet.  He states his lower extremities do not feel right and he has discomfort in them upon Awakening and they bother him when he walks and he states he feels like his coordination is off In his feet..   Review of Systems No chest pain, SOB, HA, dizziness, vision change, N/V, diarrhea, constipation, dysuria, urinary urgency or frequency, myalgias, arthralgias or rash.     Objective:   Physical Exam Vital signs noted  Well developed well nourished male.  HEENT - Head atraumatic Normocephalic                Eyes - PERRLA, Conjuctiva - clear Sclera- Clear EOMI                Ears - EAC's Wnl TM's Wnl Gross Hearing WNL                Nose - Nares patent                 Throat - oropharanx wnl Respiratory - Lungs CTA bilateral Cardiac - RRR S1 and S2 without murmur GI - Abdomen soft Nontender and bowel sounds active x 4 Extremities - No edema. Neuro - Grossly intact.  Results for orders placed in visit on 08/05/13  POCT GLYCOSYLATED HEMOGLOBIN (HGB A1C)      Result Value Range   Hemoglobin A1C 6.7%         Assessment & Plan:  Diabetes - Plan: POCT glycosylated hemoglobin (Hb A1C)  Routine general medical examination at a health care facility - Plan: DG Chest 2 View

## 2013-08-18 ENCOUNTER — Other Ambulatory Visit: Payer: Self-pay | Admitting: *Deleted

## 2013-08-18 DIAGNOSIS — R9389 Abnormal findings on diagnostic imaging of other specified body structures: Secondary | ICD-10-CM

## 2013-08-19 ENCOUNTER — Other Ambulatory Visit (INDEPENDENT_AMBULATORY_CARE_PROVIDER_SITE_OTHER): Payer: 59

## 2013-08-19 DIAGNOSIS — R9389 Abnormal findings on diagnostic imaging of other specified body structures: Secondary | ICD-10-CM

## 2013-08-19 DIAGNOSIS — R918 Other nonspecific abnormal finding of lung field: Secondary | ICD-10-CM

## 2013-08-25 ENCOUNTER — Ambulatory Visit (HOSPITAL_COMMUNITY): Payer: PRIVATE HEALTH INSURANCE

## 2013-08-27 ENCOUNTER — Other Ambulatory Visit: Payer: Self-pay | Admitting: Family Medicine

## 2013-08-27 MED ORDER — MELOXICAM 15 MG PO TABS: 15.0000 mg | ORAL_TABLET | Freq: Every day | ORAL | Status: DC

## 2013-09-12 ENCOUNTER — Other Ambulatory Visit: Payer: Self-pay | Admitting: Family Medicine

## 2013-09-12 ENCOUNTER — Ambulatory Visit (INDEPENDENT_AMBULATORY_CARE_PROVIDER_SITE_OTHER): Payer: 59 | Admitting: Family Medicine

## 2013-09-12 ENCOUNTER — Encounter: Payer: Self-pay | Admitting: Family Medicine

## 2013-09-12 VITALS — BP 155/88 | HR 82 | Temp 98.7°F | Ht 69.0 in | Wt 283.6 lb

## 2013-09-12 DIAGNOSIS — IMO0001 Reserved for inherently not codable concepts without codable children: Secondary | ICD-10-CM | POA: Insufficient documentation

## 2013-09-12 DIAGNOSIS — R030 Elevated blood-pressure reading, without diagnosis of hypertension: Secondary | ICD-10-CM

## 2013-09-12 DIAGNOSIS — R519 Headache, unspecified: Secondary | ICD-10-CM | POA: Insufficient documentation

## 2013-09-12 DIAGNOSIS — R040 Epistaxis: Secondary | ICD-10-CM | POA: Insufficient documentation

## 2013-09-12 DIAGNOSIS — Z791 Long term (current) use of non-steroidal anti-inflammatories (NSAID): Secondary | ICD-10-CM

## 2013-09-12 DIAGNOSIS — R51 Headache: Secondary | ICD-10-CM

## 2013-09-12 DIAGNOSIS — R195 Other fecal abnormalities: Secondary | ICD-10-CM

## 2013-09-12 LAB — CBC WITH DIFFERENTIAL/PLATELET
Basophils Absolute: 0.1 10*3/uL (ref 0.0–0.1)
Basophils Relative: 1 % (ref 0–1)
Eosinophils Absolute: 0.1 10*3/uL (ref 0.0–0.7)
Eosinophils Relative: 2 % (ref 0–5)
HCT: 44.6 % (ref 39.0–52.0)
Hemoglobin: 15.6 g/dL (ref 13.0–17.0)
Lymphocytes Relative: 29 % (ref 12–46)
Lymphs Abs: 1.9 10*3/uL (ref 0.7–4.0)
MCH: 31.5 pg (ref 26.0–34.0)
MCHC: 35 g/dL (ref 30.0–36.0)
MCV: 90.1 fL (ref 78.0–100.0)
Monocytes Absolute: 0.5 10*3/uL (ref 0.1–1.0)
Monocytes Relative: 8 % (ref 3–12)
Neutro Abs: 3.9 10*3/uL (ref 1.7–7.7)
Neutrophils Relative %: 61 % (ref 43–77)
Platelets: 201 10*3/uL (ref 150–400)
RBC: 4.95 MIL/uL (ref 4.22–5.81)
RDW: 12.8 % (ref 11.5–15.5)
WBC: 6.5 10*3/uL (ref 4.0–10.5)

## 2013-09-12 LAB — BASIC METABOLIC PANEL
BUN: 12 mg/dL (ref 6–23)
CO2: 27 mEq/L (ref 19–32)
Calcium: 9.9 mg/dL (ref 8.4–10.5)
Chloride: 99 mEq/L (ref 96–112)
Creat: 0.97 mg/dL (ref 0.50–1.35)
Glucose, Bld: 149 mg/dL — ABNORMAL HIGH (ref 70–99)
Potassium: 4.3 mEq/L (ref 3.5–5.3)
Sodium: 139 mEq/L (ref 135–145)

## 2013-09-12 LAB — PROTIME-INR
INR: 0.98 (ref <1.50)
Prothrombin Time: 12.8 seconds (ref 11.6–15.2)

## 2013-09-12 LAB — APTT: aPTT: 24.8 seconds (ref 24–37)

## 2013-09-12 NOTE — Progress Notes (Signed)
Patient ID: David Black, male   DOB: 1960-10-20, 53 y.o.   MRN: [id number] SUBJECTIVE: CC: Chief Complaint  Patient presents with  . Dizziness    started one day ago  . Epistaxis    HPI: Nose bleed for 45 minutes. Then headaches and dizziness and headache. No URI, no chest pain. In [place]  At [employer]. When he arose he has had the headache. No dizziness. He has a h/o recurrent epistaxis.   Past Medical History  Diagnosis Date  . GERD (gastroesophageal reflux disease)   . Obesity, unspecified   . CAD (coronary artery disease)     Minimal plaque on catheterization Nov 2011; EF normal by history  . Depression with anxiety   . Dyslipidemia     x years  . Hypertension     x 10 years  . Smoker     x 20 years, 1 1/2 ppd, quit 7 years ago  . SOB (shortness of breath) 07/18/2010  . Chest discomfort 07/18/2010    With exertion  . Hyperlipidemia    Past Surgical History  Procedure Laterality Date  . Ventral hernia repair  06/2010    x3  . Cholecystectomy    . Hernia repair     History   Social History  . Marital Status: Married    Spouse Name: N/A    Number of Children: N/A  . Years of Education: N/A   Occupational History  . Not on file.   Social History Main Topics  . Smoking status: Former Smoker -- 1.50 packs/day for 20 years    Quit date: 08/20/2002  . Smokeless tobacco: Not on file  . Alcohol Use: No  . Drug Use: No  . Sexual Activity: Not on file   Other Topics Concern  . Not on file   Social History Narrative   Married with 1 daughter   Family History  Problem Relation Age of Onset  . Heart disease Father   . Hyperlipidemia Father   . Hypertension Father   . Kidney disease Father   . Breast cancer Paternal Aunt   . Testicular cancer Paternal Uncle   . Heart disease Paternal Uncle   . Coronary artery disease Other   . Diabetes Mother   . Hyperlipidemia Mother   . Hypertension Mother    Current Outpatient Prescriptions on File  Prior to Visit  Medication Sig Dispense Refill  . aspirin 81 MG tablet Take 81 mg by mouth daily.        Marland Kitchen esomeprazole (NEXIUM) 40 MG capsule Take 40 mg by mouth daily before breakfast.      . gabapentin (NEURONTIN) 300 MG capsule Take 1 capsule (300 mg total) by mouth 3 (three) times daily.  90 capsule  3  . glimepiride (AMARYL) 4 MG tablet Take 4 mg by mouth daily with breakfast. Take 1/2 daily      . meloxicam (MOBIC) 15 MG tablet Take 1 tablet (15 mg total) by mouth daily.  30 tablet  11  . valsartan-hydrochlorothiazide (DIOVAN-HCT) 320-25 MG per tablet Take one tablet daily  30 tablet  3  . Vilazodone HCl (VIIBRYD PO) Take by mouth.       No current facility-administered medications on file prior to visit.   Allergies  Allergen Reactions  . Advicor [Niacin-Lovastatin Er]   . Bextra [Valdecoxib]   . Bupropion Hcl   . Janumet [Sitagliptin-Metformin Hcl]   . Niacin   . Rofecoxib   . Sulfamethoxazole-Trimethoprim   .  Sulfonamide Derivatives    Immunization History  Administered Date(s) Administered  . Influenza Whole 05/20/2010  . Influenza,inj,Quad PF,36+ Mos 06/09/2013   Prior to Admission medications   Medication Sig Start Date End Date Taking? Authorizing Provider  aspirin 81 MG tablet Take 81 mg by mouth daily.     Yes Historical Provider, MD  esomeprazole (NEXIUM) 40 MG capsule Take 40 mg by mouth daily before breakfast.   Yes Historical Provider, MD  gabapentin (NEURONTIN) 300 MG capsule Take 1 capsule (300 mg total) by mouth 3 (three) times daily. 08/05/13  Yes Deatra Canter, FNP  glimepiride (AMARYL) 4 MG tablet Take 4 mg by mouth daily with breakfast. Take 1/2 daily   Yes Historical Provider, MD  indomethacin (INDOCIN) 50 MG capsule Take 1 capsule (50 mg total) by mouth 2 (two) times daily with a meal. 03/10/13  Yes Ernestina Penna, MD  meloxicam (MOBIC) 15 MG tablet Take 1 tablet (15 mg total) by mouth daily. 08/27/13  Yes Deatra Canter, FNP   valsartan-hydrochlorothiazide (DIOVAN-HCT) 320-25 MG per tablet Take one tablet daily 06/04/13  Yes Deatra Canter, FNP  Vilazodone HCl (VIIBRYD PO) Take by mouth.   Yes Historical Provider, MD     ROS: As above in the HPI. All other systems are stable or negative.  OBJECTIVE: APPEARANCE:  Patient in no acute distress.The patient appeared well nourished and normally developed. Acyanotic. Waist: VITAL SIGNS:BP 155/88  Pulse 82  Temp(Src) 98.7 F (37.1 C)  Ht 5\' 9"  (1.753 m)  Wt 283 lb 9.6 oz (128.64 kg)  BMI 41.86 kg/m2  Obese WM  SKIN: warm and  Dry without overt rashes, tattoos and scars  HEAD and Neck: without JVD, Head and scalp: normal Eyes:No scleral icterus. Fundi normal, eye movements normal. Ears: Auricle normal, canal normal, Tympanic membranes normal, insufflation normal. Nose: dried blood in nostril site of bleed is the medial wall left nostril mainly. Throat: normal Neck & thyroid: normal  CHEST & LUNGS: Chest wall: normal Lungs: Clear  CVS: Reveals the PMI to be normally located. Regular rhythm, First and Second Heart sounds are normal,  absence of murmurs, rubs or gallops. Peripheral vasculature: Radial pulses: normal Dorsal pedis pulses: normal Posterior pulses: normal  ABDOMEN:  Appearance: obese Benign, no organomegaly, no masses, no Abdominal Aortic enlargement. No Guarding , no rebound. No Bruits. Bowel sounds: normal  RECTAL: no masses , prostate normal size. Heme positive brown stools.  GU: N/A  EXTREMETIES: nonedematous.  MUSCULOSKELETAL:  Spine: normal Joints: intact  NEUROLOGIC: oriented to time,place and person; nonfocal. Strength is normal Sensory is normal Reflexes are normal Cranial Nerves are normal.  ASSESSMENT: Epistaxis  Headache(784.0)  Elevated BP  Morbid obesity  Heme positive stool  NSAID long-term use - patient has been taking both meloxicam and indocin. His heme positive brown stool may be from  swallowed blood from his frequent nose bleed. My concern is the effect of using 2 NSAIDS especially indocin. Possible that his headache is a withdrawal headache from NSAID use and it could be multifactorial including hypertension.  PLAN: Stop indomethacin. Get stat labwork at [facility], across from [facility]. (CBC, BMP) Hold Rx for  BP medicine: Amlodipine 5 mg daily # 30. Refill.  If labwork okay, then start on BP medicine. Use tylenol for headache. Recheck BP on Monday.  No orders of the defined types were placed in this encounter.   No orders of the defined types were placed in this encounter.  Medications Discontinued During This Encounter  Medication Reason  . indomethacin (INDOCIN) 50 MG capsule Discontinued by provider   Return in about 2 days (around 09/14/2013) for recheck BP.  Fenton Candee P. Jacelyn Grip, M.D.  Addendum: Labs called. Sugar a little high at 142. Rest was normal. Advised patient of results. He admits to only taking a half of his valsartan/HCTZ. Then he is to hold the amlodipine and take a whole tablet of the valsartan/hctz daily. BP check on Monday. His headache has resolved.  Charmagne Buhl P. Jacelyn Grip, M.D.

## 2013-09-12 NOTE — Patient Instructions (Signed)
Stop indomethacin. Get stat labwork at [facility], across from [facility]. (CBC, BMP) Hold Rx for  BP medicine: Amlodipine 5 mg daily # 30. Refill.  If labwork okay, then start on BP medicine. Use tylenol for headache. Recheck BP on Monday.

## 2013-09-14 ENCOUNTER — Encounter (INDEPENDENT_AMBULATORY_CARE_PROVIDER_SITE_OTHER): Payer: Self-pay

## 2013-09-14 ENCOUNTER — Encounter: Payer: Self-pay | Admitting: Family Medicine

## 2013-09-14 ENCOUNTER — Ambulatory Visit (INDEPENDENT_AMBULATORY_CARE_PROVIDER_SITE_OTHER): Payer: 59 | Admitting: Family Medicine

## 2013-09-14 VITALS — BP 149/96 | HR 97 | Temp 98.0°F | Ht 69.0 in | Wt 279.2 lb

## 2013-09-14 DIAGNOSIS — I2510 Atherosclerotic heart disease of native coronary artery without angina pectoris: Secondary | ICD-10-CM

## 2013-09-14 DIAGNOSIS — Z791 Long term (current) use of non-steroidal anti-inflammatories (NSAID): Secondary | ICD-10-CM

## 2013-09-14 DIAGNOSIS — I10 Essential (primary) hypertension: Secondary | ICD-10-CM

## 2013-09-14 DIAGNOSIS — R635 Abnormal weight gain: Secondary | ICD-10-CM

## 2013-09-14 DIAGNOSIS — E669 Obesity, unspecified: Secondary | ICD-10-CM

## 2013-09-14 DIAGNOSIS — G4734 Idiopathic sleep related nonobstructive alveolar hypoventilation: Secondary | ICD-10-CM

## 2013-09-14 DIAGNOSIS — R195 Other fecal abnormalities: Secondary | ICD-10-CM

## 2013-09-14 DIAGNOSIS — E118 Type 2 diabetes mellitus with unspecified complications: Secondary | ICD-10-CM | POA: Insufficient documentation

## 2013-09-14 DIAGNOSIS — E785 Hyperlipidemia, unspecified: Secondary | ICD-10-CM

## 2013-09-14 DIAGNOSIS — R51 Headache: Secondary | ICD-10-CM

## 2013-09-14 DIAGNOSIS — E119 Type 2 diabetes mellitus without complications: Secondary | ICD-10-CM

## 2013-09-14 DIAGNOSIS — R0902 Hypoxemia: Secondary | ICD-10-CM

## 2013-09-14 HISTORY — DX: Idiopathic sleep related nonobstructive alveolar hypoventilation: G47.34

## 2013-09-14 LAB — POCT CBC
Granulocyte percent: 66 %G (ref 37–80)
HCT, POC: 49.3 % (ref 43.5–53.7)
Hemoglobin: 15.8 g/dL (ref 14.1–18.1)
Lymph, poc: 2.1 (ref 0.6–3.4)
MCH, POC: 29.1 pg (ref 27–31.2)
MCHC: 32 g/dL (ref 31.8–35.4)
MCV: 91.1 fL (ref 80–97)
MPV: 8.4 fL (ref 0–99.8)
POC Granulocyte: 5.1 (ref 2–6.9)
POC LYMPH PERCENT: 27.3 %L (ref 10–50)
Platelet Count, POC: 201 10*3/uL (ref 142–424)
RBC: 5.4 M/uL (ref 4.69–6.13)
RDW, POC: 13.2 %
WBC: 7.7 10*3/uL (ref 4.6–10.2)

## 2013-09-14 NOTE — Patient Instructions (Addendum)
    Dr Dwanda Tufano's Recommendations  For nutrition information, I recommend books:  1).Eat to Live by Dr Joel Fuhrman. 2).Prevent and Reverse Heart Disease by Dr Caldwell Esselstyn. 3) Dr Neal Barnard's Book:  Program to Reverse Diabetes  Exercise recommendations are:  If unable to walk, then the patient can exercise in a chair 3 times a day. By flapping arms like a bird gently and raising legs outwards to the front.  If ambulatory, the patient can go for walks for 30 minutes 3 times a week. Then increase the intensity and duration as tolerated.  Goal is to try to attain exercise frequency to 5 times a week.  If applicable: Best to perform resistance exercises (machines or weights) 2 days a week and cardio type exercises 3 days per week.  DASH Diet The DASH diet stands for "Dietary Approaches to Stop Hypertension." It is a healthy eating plan that has been shown to reduce high blood pressure (hypertension) in as little as 14 days, while also possibly providing other significant health benefits. These other health benefits include reducing the risk of breast cancer after menopause and reducing the risk of type 2 diabetes, heart disease, colon cancer, and stroke. Health benefits also include weight loss and slowing kidney failure in patients with chronic kidney disease.  DIET GUIDELINES  Limit salt (sodium). Your diet should contain less than 1500 mg of sodium daily.  Limit refined or processed carbohydrates. Your diet should include mostly whole grains. Desserts and added sugars should be used sparingly.  Include small amounts of heart-healthy fats. These types of fats include nuts, oils, and tub margarine. Limit saturated and trans fats. These fats have been shown to be harmful in the body. CHOOSING FOODS  The following food groups are based on a 2000 calorie diet. See your Registered Dietitian for individual calorie needs. Grains and Grain Products (6 to 8 servings daily)  Eat More  Often: Whole-wheat bread, brown rice, whole-grain or wheat pasta, quinoa, popcorn without added fat or salt (air popped).  Eat Less Often: White bread, white pasta, white rice, cornbread. Vegetables (4 to 5 servings daily)  Eat More Often: Fresh, frozen, and canned vegetables. Vegetables may be raw, steamed, roasted, or grilled with a minimal amount of fat.  Eat Less Often/Avoid: Creamed or fried vegetables. Vegetables in a cheese sauce. Fruit (4 to 5 servings daily)  Eat More Often: All fresh, canned (in natural juice), or frozen fruits. Dried fruits without added sugar. One hundred percent fruit juice ( cup [237 mL] daily).  Eat Less Often: Dried fruits with added sugar. Canned fruit in light or heavy syrup. Lean Meats, Fish, and Poultry (2 servings or less daily. One serving is 3 to 4 oz [85-114 g]).  Eat More Often: Ninety percent or leaner ground beef, tenderloin, sirloin. Round cuts of beef, chicken breast, turkey breast. All fish. Grill, bake, or broil your meat. Nothing should be fried.  Eat Less Often/Avoid: Fatty cuts of meat, turkey, or chicken leg, thigh, or wing. Fried cuts of meat or fish. Dairy (2 to 3 servings)  Eat More Often: Low-fat or fat-free milk, low-fat plain or light yogurt, reduced-fat or part-skim cheese.  Eat Less Often/Avoid: Milk (whole, 2%).Whole milk yogurt. Full-fat cheeses. Nuts, Seeds, and Legumes (4 to 5 servings per week)  Eat More Often: All without added salt.  Eat Less Often/Avoid: Salted nuts and seeds, canned beans with added salt. Fats and Sweets (limited)  Eat More Often: Vegetable oils, tub margarines   without trans fats, sugar-free gelatin. Mayonnaise and salad dressings.  Eat Less Often/Avoid: Coconut oils, palm oils, butter, stick margarine, cream, half and half, cookies, candy, pie. FOR MORE INFORMATION The Dash Diet Eating Plan: www.dashdiet.org Document Released: 07/26/2011 Document Revised: 10/29/2011 Document Reviewed:  07/26/2011 [employer] Patient Information 2014 [employer].  

## 2013-09-14 NOTE — Progress Notes (Signed)
Patient ID: David Black, male   DOB: [birth year], 53 y.o.   MRN: [id number] SUBJECTIVE: CC: Chief Complaint  Patient presents with  . Follow-up    reck bp  c/o headache    HPI: Patient is here for follow up of hypertension: denies Headache;deniesChest Pain;denies weakness;denies Shortness of Breath or Orthopnea; Visual changes: blurring of vision from the DM ;denies palpitations;denies cough;denies pedal edema;denies symptoms of TIA or stroke; admits to Compliance with medications. denies Problems with medications.  Headaches on and off Also has nocturnal hypoxia. Had sleep study in past and was mild OSA not warranting CPAP. Never got to be Rx nighttime O2.   Obesity: needs  Dietary changes.  DM was not at goal  Past Medical History  Diagnosis Date  . GERD (gastroesophageal reflux disease)   . Obesity, unspecified   . CAD (coronary artery disease)     Minimal plaque on catheterization Nov 2011; EF normal by history  . Depression with anxiety   . Dyslipidemia     x years  . Hypertension     x 10 years  . Smoker     x 20 years, 1 1/2 ppd, quit 7 years ago  . SOB (shortness of breath) 07/18/2010  . Chest discomfort 07/18/2010    With exertion  . Hyperlipidemia   . Diabetes mellitus without complication    Past Surgical History  Procedure Laterality Date  . Ventral hernia repair  06/2010    x3  . Cholecystectomy    . Hernia repair     History   Social History  . Marital Status: Married    Spouse Name: N/A    Number of Children: N/A  . Years of Education: N/A   Occupational History  . Not on file.   Social History Main Topics  . Smoking status: Former Smoker -- 1.50 packs/day for 20 years    Quit date: 08/20/2002  . Smokeless tobacco: Not on file  . Alcohol Use: No  . Drug Use: No  . Sexual Activity: Not on file   Other Topics Concern  . Not on file   Social History Narrative   Married with 1 daughter   Family History  Problem Relation Age of  Onset  . Heart disease Father   . Hyperlipidemia Father   . Hypertension Father   . Kidney disease Father   . Breast cancer Paternal Aunt   . Testicular cancer Paternal Uncle   . Heart disease Paternal Uncle   . Coronary artery disease Other   . Diabetes Mother   . Hyperlipidemia Mother   . Hypertension Mother    Current Outpatient Prescriptions on File Prior to Visit  Medication Sig Dispense Refill  . aspirin 81 MG tablet Take 81 mg by mouth daily.        Marland Kitchen esomeprazole (NEXIUM) 40 MG capsule Take 40 mg by mouth daily before breakfast.      . meloxicam (MOBIC) 15 MG tablet Take 1 tablet (15 mg total) by mouth daily.  30 tablet  11  . valsartan-hydrochlorothiazide (DIOVAN-HCT) 320-25 MG per tablet Take one tablet daily  30 tablet  3  . Vilazodone HCl (VIIBRYD PO) Take by mouth.       No current facility-administered medications on file prior to visit.   Allergies  Allergen Reactions  . Advicor [Niacin-Lovastatin Er]   . Altace [Ramipril]     cough  . Bextra [Valdecoxib]   . Bupropion Hcl   . Janumet [Sitagliptin-Metformin Hcl]   .  Niacin   . Rofecoxib   . Sulfamethoxazole-Trimethoprim   . Sulfonamide Derivatives    Immunization History  Administered Date(s) Administered  . Influenza Whole 05/20/2010  . Influenza,inj,Quad PF,36+ Mos 06/09/2013   Prior to Admission medications   Medication Sig Start Date End Date Taking? Authorizing Provider  aspirin 81 MG tablet Take 81 mg by mouth daily.      Historical Provider, MD  esomeprazole (NEXIUM) 40 MG capsule Take 40 mg by mouth daily before breakfast.    Historical Provider, MD  gabapentin (NEURONTIN) 300 MG capsule Take 1 capsule (300 mg total) by mouth 3 (three) times daily. 08/05/13   Lysbeth Penner, FNP  glimepiride (AMARYL) 4 MG tablet Take 4 mg by mouth daily with breakfast. Take 1/2 daily    Historical Provider, MD  meloxicam (MOBIC) 15 MG tablet Take 1 tablet (15 mg total) by mouth daily. 08/27/13   Lysbeth Penner,  FNP  valsartan-hydrochlorothiazide (DIOVAN-HCT) 320-25 MG per tablet Take one tablet daily 06/04/13   Lysbeth Penner, FNP  Vilazodone HCl (VIIBRYD PO) Take by mouth.    Historical Provider, MD     ROS: As above in the HPI. All other systems are stable or negative.  OBJECTIVE: APPEARANCE:  Patient in no acute distress.The patient appeared well nourished and normally developed. Acyanotic. Waist: VITAL SIGNS:BP 149/96  Pulse 97  Temp(Src) 98 F (36.7 C) (Oral)  Ht 5\' 9"  (1.753 m)  Wt 279 lb 3.2 oz (126.644 kg)  BMI 41.21 kg/m2  Morbidly obese WM  SKIN: warm and  Dry without overt rashes, tattoos and scars  HEAD and Neck: without JVD, Head and scalp: normal Eyes:No scleral icterus. Fundi normal, eye movements normal. Ears: Auricle normal, canal normal, Tympanic membranes normal, insufflation normal. Nose: normal Throat: normal Neck & thyroid: normal  CHEST & LUNGS: Chest wall: normal Lungs: Clear  CVS: Reveals the PMI to be normally located. Regular rhythm, First and Second Heart sounds are normal,  absence of murmurs, rubs or gallops. Peripheral vasculature: Radial pulses: normal Dorsal pedis pulses: normal Posterior pulses: normal  ABDOMEN:  Appearance: obese Benign, no organomegaly, no masses, no Abdominal Aortic enlargement. No Guarding , no rebound. No Bruits. Bowel sounds: normal  RECTAL: N/A GU: N/A  EXTREMETIES: nonedematous.  MUSCULOSKELETAL:  Spine: normal Joints: intact  NEUROLOGIC: oriented to time,place and person; nonfocal. Strength is normal Sensory is normal Reflexes are normal Cranial Nerves are normal.   Results for orders placed in visit on 09/14/13  POCT CBC      Result Value Range   WBC 7.7  4.6 - 10.2 K/uL   Lymph, poc 2.1  0.6 - 3.4   POC LYMPH PERCENT 27.3  10 - 50 %L   POC Granulocyte 5.1  2 - 6.9   Granulocyte percent 66.0  37 - 80 %G   RBC 5.4  4.69 - 6.13 M/uL   Hemoglobin 15.8  14.1 - 18.1 g/dL   HCT, POC 49.3   43.5 - 53.7 %   MCV 91.1  80 - 97 fL   MCH, POC 29.1  27 - 31.2 pg   MCHC 32.0  31.8 - 35.4 g/dL   RDW, POC 13.2     Platelet Count, POC 201.0  142 - 424 K/uL   MPV 8.4  0 - 99.8 fL    ASSESSMENT:  Heme positive stool - Plan: POCT CBC  HLD (hyperlipidemia) - Plan: Hepatic function panel, NMR, lipoprofile  Nocturnal hypoxia - Plan: Ambulatory referral to  Home Health  Obesity, unspecified  NSAID long-term use  HYPERTENSION  Headache(784.0)  Diabetes mellitus without complication  CAD  Heme positive stool from swallowing blood from epistaxis most likely.   PLAN:      Dr Paula Libra Recommendations  For nutrition information, I recommend books:  1).Eat to Live by Dr Excell Seltzer. 2).Prevent and Reverse Heart Disease by Dr Karl Luke. 3) Dr Janene Harvey Book:  Program to Reverse Diabetes  Exercise recommendations are:  If unable to walk, then the patient can exercise in a chair 3 times a day. By flapping arms like a bird gently and raising legs outwards to the front.  If ambulatory, the patient can go for walks for 30 minutes 3 times a week. Then increase the intensity and duration as tolerated.  Goal is to try to attain exercise frequency to 5 times a week.  If applicable: Best to perform resistance exercises (machines or weights) 2 days a week and cardio type exercises 3 days per week.  Orders Placed This Encounter  Procedures  . Hepatic function panel  . NMR, lipoprofile  . Ambulatory referral to Home Health    Referral Priority:  Routine    Referral Type:  Home Health Care    Referral Reason:  Specialty Services Required    Requested Specialty:  [place]    Number of Visits Requested:  1  . POCT CBC   Meds ordered this encounter  Medications  . DISCONTD: indomethacin (INDOCIN) 50 MG capsule    Sig:   . gabapentin (NEURONTIN) 300 MG capsule    Sig: Take 300 mg by mouth 2 (two) times daily.  Marland Kitchen DISCONTD: glimepiride (AMARYL) 4  MG tablet    Sig: Take 1 tablet (4 mg total) by mouth daily with breakfast. Take 1/2 daily    Dispense:  30 tablet    Refill:  0  . glimepiride (AMARYL) 4 MG tablet    Sig: Take 1 tablet (4 mg total) by mouth daily with breakfast.    Dispense:  30 tablet    Refill:  0   Medications Discontinued During This Encounter  Medication Reason  . gabapentin (NEURONTIN) 300 MG capsule   . indomethacin (INDOCIN) 50 MG capsule Discontinued by provider  . glimepiride (AMARYL) 4 MG tablet Reorder  . glimepiride (AMARYL) 4 MG tablet Reorder  patient to discontinue indocin  Return in about 2 weeks (around 09/28/2013) for recheck BP.  Jordani Nunn P. Jacelyn Grip, M.D.

## 2013-09-15 LAB — NMR, LIPOPROFILE
Cholesterol: 223 mg/dL — ABNORMAL HIGH (ref <200)
HDL Cholesterol by NMR: 35 mg/dL — ABNORMAL LOW (ref >=40)
HDL Particle Number: 27.3 umol/L — ABNORMAL LOW (ref >=30.5)
LDL Particle Number: 2054 nmol/L — ABNORMAL HIGH (ref <1000)
LDL Size: 19.4 nm — ABNORMAL LOW (ref >20.5)
LP-IR Score: 79 — ABNORMAL HIGH (ref <=45)
Small LDL Particle Number: 1822 nmol/L — ABNORMAL HIGH (ref <=527)
Triglycerides by NMR: 472 mg/dL — ABNORMAL HIGH (ref <150)

## 2013-09-15 LAB — HEPATIC FUNCTION PANEL
ALT: 32 IU/L (ref 0–44)
AST: 21 IU/L (ref 0–40)
Albumin: 4.8 g/dL (ref 3.5–5.5)
Alkaline Phosphatase: 57 IU/L (ref 39–117)
Bilirubin, Direct: 0.08 mg/dL (ref 0.00–0.40)
Total Bilirubin: 0.3 mg/dL (ref 0.0–1.2)
Total Protein: 7.4 g/dL (ref 6.0–8.5)

## 2013-09-16 ENCOUNTER — Other Ambulatory Visit: Payer: Self-pay | Admitting: Family Medicine

## 2013-09-16 DIAGNOSIS — E785 Hyperlipidemia, unspecified: Secondary | ICD-10-CM

## 2013-09-16 MED ORDER — FENOFIBRATE 54 MG PO TABS: 54.0000 mg | ORAL_TABLET | Freq: Every day | ORAL | Status: DC

## 2013-09-16 NOTE — Progress Notes (Signed)
Quick Note:  Call Patient Labs that are abnormal: Lipids are too high  The rest are at goal  Recommendations: Start on fenofibrate to reduce his cardiac risk. Will Rx fenofibrate 54 mg daily. Needs to start on a plant based diet as we discussed.   ______

## 2013-09-28 ENCOUNTER — Encounter: Payer: Self-pay | Admitting: Family Medicine

## 2013-09-28 ENCOUNTER — Ambulatory Visit (INDEPENDENT_AMBULATORY_CARE_PROVIDER_SITE_OTHER): Payer: 59 | Admitting: Family Medicine

## 2013-09-28 VITALS — BP 130/83 | HR 87 | Temp 98.0°F | Ht 69.0 in | Wt 278.0 lb

## 2013-09-28 DIAGNOSIS — E119 Type 2 diabetes mellitus without complications: Secondary | ICD-10-CM

## 2013-09-28 DIAGNOSIS — R0902 Hypoxemia: Secondary | ICD-10-CM

## 2013-09-28 DIAGNOSIS — I10 Essential (primary) hypertension: Secondary | ICD-10-CM

## 2013-09-28 DIAGNOSIS — Z791 Long term (current) use of non-steroidal anti-inflammatories (NSAID): Secondary | ICD-10-CM

## 2013-09-28 DIAGNOSIS — E669 Obesity, unspecified: Secondary | ICD-10-CM

## 2013-09-28 DIAGNOSIS — G4734 Idiopathic sleep related nonobstructive alveolar hypoventilation: Secondary | ICD-10-CM

## 2013-09-28 DIAGNOSIS — F341 Dysthymic disorder: Secondary | ICD-10-CM

## 2013-09-28 DIAGNOSIS — I2510 Atherosclerotic heart disease of native coronary artery without angina pectoris: Secondary | ICD-10-CM

## 2013-09-28 DIAGNOSIS — K219 Gastro-esophageal reflux disease without esophagitis: Secondary | ICD-10-CM

## 2013-09-28 DIAGNOSIS — R040 Epistaxis: Secondary | ICD-10-CM

## 2013-09-28 DIAGNOSIS — R51 Headache: Secondary | ICD-10-CM

## 2013-09-28 DIAGNOSIS — E785 Hyperlipidemia, unspecified: Secondary | ICD-10-CM

## 2013-09-28 NOTE — Progress Notes (Signed)
Patient ID: David Black, male   DOB: 06-09-1961, 53 y.o.   MRN: [id number] SUBJECTIVE: CC: Chief Complaint  Patient presents with  . Follow-up    2 week ck up     HPI: Patient is here for follow up of hypertension/epistaxis/obesity/Dyslipidemia/CAD: denies Headache;deniesChest Pain;denies weakness;denies Shortness of Breath or Orthopnea;denies Visual changes;denies palpitations;denies cough;denies pedal edema;denies symptoms of TIA or stroke; admits to Compliance with medications. denies Problems with medications. Occasional nose bleeds continues. Headaches less often  Making LTC with reduction of sweet drinks and eating better. Has lost some weight but has not gone all the way vegan.  Did the oxygen study. Results on paper chart. He gets hypoxic at night intermittently with the lowest O2 sat at 84% and the highest at 98%  tolerating the fenofibrate well.  Past Medical History  Diagnosis Date  . GERD (gastroesophageal reflux disease)   . Obesity, unspecified   . CAD (coronary artery disease)     Minimal plaque on catheterization Nov 2011; EF normal by history  . Depression with anxiety   . Dyslipidemia     x years  . Hypertension     x 10 years  . Smoker     x 20 years, 1 1/2 ppd, quit 7 years ago  . SOB (shortness of breath) 07/18/2010  . Chest discomfort 07/18/2010    With exertion  . Hyperlipidemia   . Diabetes mellitus without complication    Past Surgical History  Procedure Laterality Date  . Ventral hernia repair  06/2010    x3  . Cholecystectomy    . Hernia repair     History   Social History  . Marital Status: Married    Spouse Name: N/A    Number of Children: N/A  . Years of Education: N/A   Occupational History  . Not on file.   Social History Main Topics  . Smoking status: Former Smoker -- 1.50 packs/day for 20 years    Quit date: 08/20/2002  . Smokeless tobacco: Not on file  . Alcohol Use: No  . Drug Use: No  . Sexual Activity: Not on  file   Other Topics Concern  . Not on file   Social History Narrative   Married with 1 daughter   Family History  Problem Relation Age of Onset  . Heart disease Father   . Hyperlipidemia Father   . Hypertension Father   . Kidney disease Father   . Breast cancer Paternal Aunt   . Testicular cancer Paternal Uncle   . Heart disease Paternal Uncle   . Coronary artery disease Other   . Diabetes Mother   . Hyperlipidemia Mother   . Hypertension Mother    Current Outpatient Prescriptions on File Prior to Visit  Medication Sig Dispense Refill  . aspirin 81 MG tablet Take 81 mg by mouth daily.        Marland Kitchen esomeprazole (NEXIUM) 40 MG capsule Take 40 mg by mouth daily before breakfast.      . fenofibrate 54 MG tablet Take 1 tablet (54 mg total) by mouth daily.  30 tablet  5  . gabapentin (NEURONTIN) 300 MG capsule Take 300 mg by mouth 2 (two) times daily.      Marland Kitchen glimepiride (AMARYL) 4 MG tablet Take 1 tablet (4 mg total) by mouth daily with breakfast.  30 tablet  0  . meloxicam (MOBIC) 15 MG tablet Take 1 tablet (15 mg total) by mouth daily.  30 tablet  11  .  valsartan-hydrochlorothiazide (DIOVAN-HCT) 320-25 MG per tablet Take one tablet daily  30 tablet  3  . Vilazodone HCl (VIIBRYD PO) Take by mouth.       No current facility-administered medications on file prior to visit.   Allergies  Allergen Reactions  . Advicor [Niacin-Lovastatin Er]   . Altace [Ramipril]     cough  . Bextra [Valdecoxib]   . Bupropion Hcl   . Janumet [Sitagliptin-Metformin Hcl]   . Niacin   . Rofecoxib   . Sulfamethoxazole-Trimethoprim   . Sulfonamide Derivatives    Immunization History  Administered Date(s) Administered  . Influenza Whole 05/20/2010  . Influenza,inj,Quad PF,36+ Mos 06/09/2013  . Td 05/19/2013  . Tdap 01/25/2006   Prior to Admission medications   Medication Sig Start Date End Date Taking? Authorizing Provider  aspirin 81 MG tablet Take 81 mg by mouth daily.     Yes Historical  Provider, MD  esomeprazole (NEXIUM) 40 MG capsule Take 40 mg by mouth daily before breakfast.   Yes Historical Provider, MD  fenofibrate 54 MG tablet Take 1 tablet (54 mg total) by mouth daily. 09/16/13  Yes Vernie Shanks, MD  gabapentin (NEURONTIN) 300 MG capsule Take 300 mg by mouth 2 (two) times daily. 08/05/13  Yes Lysbeth Penner, FNP  glimepiride (AMARYL) 4 MG tablet Take 1 tablet (4 mg total) by mouth daily with breakfast. 09/14/13  Yes Vernie Shanks, MD  meloxicam (MOBIC) 15 MG tablet Take 1 tablet (15 mg total) by mouth daily. 08/27/13  Yes Lysbeth Penner, FNP  valsartan-hydrochlorothiazide (DIOVAN-HCT) 320-25 MG per tablet Take one tablet daily 06/04/13  Yes Lysbeth Penner, FNP  Vilazodone HCl (VIIBRYD PO) Take by mouth.   Yes Historical Provider, MD     ROS: As above in the HPI. All other systems are stable or negative.  OBJECTIVE: APPEARANCE:  Patient in no acute distress.The patient appeared well nourished and normally developed. Acyanotic. Waist: VITAL SIGNS:BP 130/83  Pulse 87  Temp(Src) 98 F (36.7 C) (Oral)  Ht 5\' 9"  (1.753 m)  Wt 278 lb (126.1 kg)  BMI 41.03 kg/m2  SpO2 95% OBese WM   SKIN: warm and  Dry without overt rashes, tattoos and scars  HEAD and Neck: without JVD, Head and scalp: normal Eyes:No scleral icterus. Fundi normal, eye movements normal. Ears: Auricle normal, canal normal, Tympanic membranes normal, insufflation normal. Nose: medial wall of the nasal passages has dried blood. Scabbed and fresh dried blood in the left nostril. Throat: normal Neck & thyroid: normal  CHEST & LUNGS: Chest wall: normal Lungs: Clear  CVS: Reveals the PMI to be normally located. Regular rhythm, First and Second Heart sounds are normal,  absence of murmurs, rubs or gallops. Peripheral vasculature: Radial pulses: normal Dorsal pedis pulses: normal Posterior pulses: normal  ABDOMEN:  Appearance: normal Benign, no organomegaly, no masses, no Abdominal  Aortic enlargement. No Guarding , no rebound. No Bruits. Bowel sounds: normal  RECTAL: N/A GU: N/A  EXTREMETIES: nonedematous.  MUSCULOSKELETAL:  Spine: normal Joints: intact  NEUROLOGIC: oriented to time,place and person; nonfocal. Strength is normal Sensory is normal Reflexes are normal Cranial Nerves are normal.   DATA BASE: The nocturnal O2 sats reflects nocturnal hypoxia. Patient had a sleep study in the past which was supposedly mild and did not need CPAP.   ASSESSMENT:  HYPERTENSION - better  Obesity, unspecified  Headache(784.0)  DYSLIPIDEMIA  Diabetes mellitus without complication  CAD  DEPRESSION/ANXIETY  GERD  Epistaxis  Nocturnal hypoxia  NSAID  long-term use Headache improving BP improving Obesity patient making headway with dietary changes and weight losss.  PLAN: Use a little vaseline in the nares to keep the nasal mucosa moist. Humidifier Consider ENT referral: patient declines at this time.  Plan on home O2 at night. I suspect the nocturnal hypoxia is attributing to the headaches.  Continue and encourage to progress to a vegan diet.  Reviewed the labs with patient.  No orders of the defined types were placed in this encounter.   No orders of the defined types were placed in this encounter.   There are no discontinued medications. Return in about 3 months (around 12/26/2013) for First week of May., Recheck medical problems, recheck BP. and labs.  Chani Ghanem P. Jacelyn Grip, M.D.

## 2013-09-29 ENCOUNTER — Other Ambulatory Visit: Payer: Self-pay | Admitting: Family Medicine

## 2013-09-29 ENCOUNTER — Telehealth: Payer: Self-pay | Admitting: Family Medicine

## 2013-09-29 DIAGNOSIS — G4734 Idiopathic sleep related nonobstructive alveolar hypoventilation: Secondary | ICD-10-CM

## 2013-10-20 ENCOUNTER — Other Ambulatory Visit: Payer: Self-pay | Admitting: *Deleted

## 2013-10-20 MED ORDER — GLIMEPIRIDE 4 MG PO TABS: 4.0000 mg | ORAL_TABLET | Freq: Every day | ORAL | Status: DC

## 2013-11-10 ENCOUNTER — Other Ambulatory Visit: Payer: Self-pay | Admitting: Family Medicine

## 2013-11-10 DIAGNOSIS — G4734 Idiopathic sleep related nonobstructive alveolar hypoventilation: Secondary | ICD-10-CM

## 2013-11-21 ENCOUNTER — Other Ambulatory Visit: Payer: Self-pay | Admitting: Family Medicine

## 2013-12-22 ENCOUNTER — Other Ambulatory Visit: Payer: Self-pay | Admitting: Family Medicine

## 2013-12-22 ENCOUNTER — Ambulatory Visit: Payer: 59 | Admitting: Family Medicine

## 2014-01-25 ENCOUNTER — Other Ambulatory Visit: Payer: Self-pay | Admitting: Family Medicine

## 2014-02-11 ENCOUNTER — Encounter: Payer: Self-pay | Admitting: Physician Assistant

## 2014-02-11 ENCOUNTER — Ambulatory Visit (INDEPENDENT_AMBULATORY_CARE_PROVIDER_SITE_OTHER): Payer: 59 | Admitting: Physician Assistant

## 2014-02-11 VITALS — BP 122/72 | HR 81 | Temp 98.2°F | Ht 69.0 in | Wt 284.4 lb

## 2014-02-11 DIAGNOSIS — E119 Type 2 diabetes mellitus without complications: Secondary | ICD-10-CM

## 2014-02-11 DIAGNOSIS — R739 Hyperglycemia, unspecified: Secondary | ICD-10-CM

## 2014-02-11 DIAGNOSIS — E139 Other specified diabetes mellitus without complications: Secondary | ICD-10-CM

## 2014-02-11 LAB — POCT GLYCOSYLATED HEMOGLOBIN (HGB A1C): HEMOGLOBIN A1C: 9.1

## 2014-02-11 LAB — GLUCOSE, POCT (MANUAL RESULT ENTRY): POC GLUCOSE: 319 mg/dL — AB (ref 70–99)

## 2014-02-11 NOTE — Patient Instructions (Signed)
Diabetes and Exercise Exercising regularly is important. It is not just about losing weight. It has many health benefits, such as:  Improving your overall fitness, flexibility, and endurance.  Increasing your bone density.  Helping with weight control.  Decreasing your body fat.  Increasing your muscle strength.  Reducing stress and tension.  Improving your overall health. People with diabetes who exercise gain additional benefits because exercise:  Reduces appetite.  Improves the body's use of blood sugar (glucose).  Helps lower or control blood glucose.  Decreases blood pressure.  Helps control blood lipids (such as cholesterol and triglycerides).  Improves the body's use of the hormone insulin by:  Increasing the body's insulin sensitivity.  Reducing the body's insulin needs.  Decreases the risk for heart disease because exercising:  Lowers cholesterol and triglycerides levels.  Increases the levels of good cholesterol (such as high-density lipoproteins [HDL]) in the body.  Lowers blood glucose levels. YOUR ACTIVITY PLAN  Choose an activity that you enjoy and set realistic goals. Your health care provider or diabetes educator can help you make an activity plan that works for you. You can break activities into 2 or 3 sessions throughout the day. Doing so is as good as one long session. Exercise ideas include:  Taking the dog for a walk.  Taking the stairs instead of the elevator.  Dancing to your favorite song.  Doing your favorite exercise with a friend. RECOMMENDATIONS FOR EXERCISING WITH TYPE 1 OR TYPE 2 DIABETES   Check your blood glucose before exercising. If blood glucose levels are greater than 240 mg/dL, check for urine ketones. Do not exercise if ketones are present.  Avoid injecting insulin into areas of the body that are going to be exercised. For example, avoid injecting insulin into:  The arms when playing tennis.  The legs when  jogging.  Keep a record of:  Food intake before and after you exercise.  Expected peak times of insulin action.  Blood glucose levels before and after you exercise.  The type and amount of exercise you have done.  Review your records with your health care provider. Your health care provider will help you to develop guidelines for adjusting food intake and insulin amounts before and after exercising.  If you take insulin or oral hypoglycemic agents, watch for signs and symptoms of hypoglycemia. They include:  Dizziness.  Shaking.  Sweating.  Chills.  Confusion.  Drink plenty of water while you exercise to prevent dehydration or heat stroke. Body water is lost during exercise and must be replaced.  Talk to your health care provider before starting an exercise program to make sure it is safe for you. Remember, almost any type of activity is better than none. Document Released: 10/27/2003 Document Revised: 04/08/2013 Document Reviewed: 01/13/2013 [employer] Patient Information 2015 [employer]. This information is not intended to replace advice given to you by your health care provider. Make sure you discuss any questions you have with your health care provider.  

## 2014-02-11 NOTE — Progress Notes (Signed)
Subjective:     Patient ID: David Black, male   DOB: [birth year], 53 y.o.   MRN: [id number]  HPI Pt with onset of vision change this am He states he was unable to focus on his computer Sx since have improved Also with some numbness to the 4-5th finger on the L hand Pt with a hx of borderline BS readings  Review of Systems  Constitutional: Negative for diaphoresis, activity change, appetite change, fatigue and unexpected weight change.  HENT: Negative.   Respiratory: Negative for cough, chest tightness and shortness of breath.   Cardiovascular: Negative for chest pain, palpitations and leg swelling.  Gastrointestinal: Negative.   Endocrine: Positive for polydipsia and polyuria. Negative for polyphagia.       Objective:   Physical Exam  Nursing note and vitals reviewed. Constitutional: He is oriented to person, place, and time. He appears well-developed and well-nourished.  Eyes: Pupils are equal, round, and reactive to light.  EOMI CN 2-12 intact  Neck: Normal range of motion. Neck supple. No JVD present.  No bruits  Cardiovascular: Normal rate, regular rhythm, normal heart sounds and intact distal pulses.   Pulmonary/Chest: Effort normal and breath sounds normal.  Musculoskeletal:  Nl and equal strength in the upper ext  Lymphadenopathy:    He has no cervical adenopathy.  Neurological: He is alert and oriented to person, place, and time. He has normal reflexes. No cranial nerve deficit. Coordination normal.   BS/A1C- see labs    Assessment:     NIDDM    Plan:    Pt unable to take Glucophage due to prev allergy rxn Invokana 100mg  q d in the am Cont the Amaryl Reviewed diet with pt which he admits he has let slip Regular BS testing at home Pt already has BS machine and log given today Pt to f//u in 3 wk with log

## 2014-02-22 ENCOUNTER — Other Ambulatory Visit: Payer: Self-pay | Admitting: Family Medicine

## 2014-03-04 ENCOUNTER — Other Ambulatory Visit: Payer: Self-pay

## 2014-03-08 ENCOUNTER — Other Ambulatory Visit: Payer: Self-pay | Admitting: Physician Assistant

## 2014-03-18 ENCOUNTER — Other Ambulatory Visit: Payer: Self-pay | Admitting: *Deleted

## 2014-03-18 DIAGNOSIS — E785 Hyperlipidemia, unspecified: Secondary | ICD-10-CM

## 2014-03-18 MED ORDER — ONETOUCH DELICA LANCETS FINE MISC: 1.0000 | Freq: Two times a day (BID) | Status: DC

## 2014-03-18 MED ORDER — FENOFIBRATE 54 MG PO TABS: 54.0000 mg | ORAL_TABLET | Freq: Every day | ORAL | Status: DC

## 2014-03-18 MED ORDER — GLUCOSE BLOOD VI STRP: 1.0000 | ORAL_STRIP | Freq: Two times a day (BID) | Status: DC

## 2014-03-18 MED ORDER — PANTOPRAZOLE SODIUM 40 MG PO TBEC: 40.0000 mg | DELAYED_RELEASE_TABLET | Freq: Every day | ORAL | Status: DC

## 2014-03-18 MED ORDER — VALSARTAN-HYDROCHLOROTHIAZIDE 320-25 MG PO TABS: ORAL_TABLET | ORAL | Status: DC

## 2014-03-26 ENCOUNTER — Encounter: Payer: Self-pay | Admitting: Nurse Practitioner

## 2014-03-26 ENCOUNTER — Ambulatory Visit (INDEPENDENT_AMBULATORY_CARE_PROVIDER_SITE_OTHER): Payer: 59 | Admitting: Nurse Practitioner

## 2014-03-26 VITALS — BP 121/74 | HR 69 | Temp 97.7°F | Ht 69.0 in | Wt 267.8 lb

## 2014-03-26 DIAGNOSIS — I951 Orthostatic hypotension: Secondary | ICD-10-CM

## 2014-03-26 NOTE — Progress Notes (Signed)
   Subjective:    Patient ID: David Black, male    DOB: May 15, 1961, 53 y.o.   MRN: [id number]  HPI Patien tin today c/o drop i blood pressure- he was taking Diovan/HCTZ- noticed yesterday- causing dizziness when he would go from sitting or squating to standing. Yesterday morning it was 90/60 after taking meds. Has noit taken meds today. Patient has lost 17 lbs since last visit.    Review of Systems  Constitutional: Negative.   HENT: Negative.   Respiratory: Negative.   Cardiovascular: Negative.   Genitourinary: Negative.   Neurological: Positive for dizziness.  All other systems reviewed and are negative.      Objective:   Physical Exam  Constitutional: He is oriented to person, place, and time. He appears well-developed and well-nourished.  Cardiovascular: Normal rate, regular rhythm and normal heart sounds.   Pulmonary/Chest: Effort normal and breath sounds normal.  Neurological: He is alert and oriented to person, place, and time.  Skin: Skin is warm and dry.  Psychiatric: He has a normal mood and affect. His behavior is normal. Judgment and thought content normal.   BP 121/74  Pulse 69  Temp(Src) 97.7 F (36.5 C) (Oral)  Ht 5\' 9"  (1.753 m)  Wt 267 lb 12.8 oz (121.473 kg)  BMI 39.53 kg/m2        Assessment & Plan:   1. Orthostatic hypotension    Hold diovan- have wife keep check of blood pressures at home nd if can get someone to check at work- keep a diary If stasts going back up may need to do 1/2 of divan 320/25. Follow up in 2 months  [place], FNP

## 2014-03-26 NOTE — Patient Instructions (Signed)

## 2014-04-10 ENCOUNTER — Other Ambulatory Visit: Payer: Self-pay | Admitting: Family Medicine

## 2014-05-05 ENCOUNTER — Other Ambulatory Visit: Payer: Self-pay | Admitting: Family Medicine

## 2014-05-10 ENCOUNTER — Other Ambulatory Visit: Payer: Self-pay

## 2014-05-10 MED ORDER — GABAPENTIN 300 MG PO CAPS: ORAL_CAPSULE | ORAL | Status: DC

## 2014-05-17 ENCOUNTER — Ambulatory Visit (INDEPENDENT_AMBULATORY_CARE_PROVIDER_SITE_OTHER): Payer: 59

## 2014-05-17 ENCOUNTER — Ambulatory Visit (INDEPENDENT_AMBULATORY_CARE_PROVIDER_SITE_OTHER): Payer: 59 | Admitting: Family Medicine

## 2014-05-17 ENCOUNTER — Other Ambulatory Visit: Payer: Self-pay | Admitting: Family Medicine

## 2014-05-17 ENCOUNTER — Encounter: Payer: Self-pay | Admitting: Family Medicine

## 2014-05-17 VITALS — BP 134/84 | HR 68 | Temp 99.1°F | Ht 69.0 in | Wt 268.0 lb

## 2014-05-17 DIAGNOSIS — E785 Hyperlipidemia, unspecified: Secondary | ICD-10-CM

## 2014-05-17 DIAGNOSIS — Z87891 Personal history of nicotine dependence: Secondary | ICD-10-CM

## 2014-05-17 DIAGNOSIS — IMO0002 Reserved for concepts with insufficient information to code with codable children: Secondary | ICD-10-CM

## 2014-05-17 DIAGNOSIS — R059 Cough, unspecified: Secondary | ICD-10-CM

## 2014-05-17 DIAGNOSIS — R05 Cough: Secondary | ICD-10-CM

## 2014-05-17 DIAGNOSIS — M545 Low back pain, unspecified: Secondary | ICD-10-CM

## 2014-05-17 DIAGNOSIS — Z0000 Encounter for general adult medical examination without abnormal findings: Secondary | ICD-10-CM

## 2014-05-17 DIAGNOSIS — K219 Gastro-esophageal reflux disease without esophagitis: Secondary | ICD-10-CM

## 2014-05-17 DIAGNOSIS — E118 Type 2 diabetes mellitus with unspecified complications: Secondary | ICD-10-CM

## 2014-05-17 DIAGNOSIS — Z23 Encounter for immunization: Secondary | ICD-10-CM

## 2014-05-17 DIAGNOSIS — E1165 Type 2 diabetes mellitus with hyperglycemia: Secondary | ICD-10-CM

## 2014-05-17 LAB — POCT CBC
Granulocyte percent: 65.9 %G (ref 37–80)
HCT, POC: 44.9 % (ref 43.5–53.7)
Hemoglobin: 15.5 g/dL (ref 14.1–18.1)
Lymph, poc: 1.8 (ref 0.6–3.4)
MCH, POC: 30.3 pg (ref 27–31.2)
MCHC: 34.5 g/dL (ref 31.8–35.4)
MCV: 88 fL (ref 80–97)
MPV: 8.5 fL (ref 0–99.8)
POC Granulocyte: 4 (ref 2–6.9)
POC LYMPH PERCENT: 28.8 %L (ref 10–50)
Platelet Count, POC: 195 10*3/uL (ref 142–424)
RBC: 5.1 M/uL (ref 4.69–6.13)
RDW, POC: 13.3 %
WBC: 6.1 10*3/uL (ref 4.6–10.2)

## 2014-05-17 LAB — POCT GLYCOSYLATED HEMOGLOBIN (HGB A1C): Hemoglobin A1C: 6

## 2014-05-17 MED ORDER — CYCLOBENZAPRINE HCL 10 MG PO TABS: 10.0000 mg | ORAL_TABLET | Freq: Three times a day (TID) | ORAL | Status: DC | PRN

## 2014-05-17 MED ORDER — ESOMEPRAZOLE MAGNESIUM 40 MG PO CPDR: 40.0000 mg | DELAYED_RELEASE_CAPSULE | Freq: Every day | ORAL | Status: DC

## 2014-05-17 MED ORDER — CANAGLIFLOZIN 100 MG PO TABS: 100.0000 mg | ORAL_TABLET | Freq: Once | ORAL | Status: DC

## 2014-05-17 MED ORDER — GLUCOSE BLOOD VI STRP: 1.0000 | ORAL_STRIP | Freq: Two times a day (BID) | Status: DC

## 2014-05-17 MED ORDER — ONETOUCH DELICA LANCETS FINE MISC: 1.0000 | Freq: Two times a day (BID) | Status: DC

## 2014-05-17 MED ORDER — FENOFIBRATE 54 MG PO TABS: 54.0000 mg | ORAL_TABLET | Freq: Every day | ORAL | Status: DC

## 2014-05-17 MED ORDER — MELOXICAM 15 MG PO TABS: 15.0000 mg | ORAL_TABLET | Freq: Every day | ORAL | Status: DC

## 2014-05-17 MED ORDER — GLIMEPIRIDE 4 MG PO TABS: 4.0000 mg | ORAL_TABLET | Freq: Every day | ORAL | Status: DC

## 2014-05-17 NOTE — Progress Notes (Signed)
   Subjective:    Patient ID: [name], male    DOB: 25-Feb-1961, 53 y.o.   MRN: [id number]  HPI C/o routine appointment. He has hx of diabetes and his fsbs's have been better.  He has hx of arthritis. He tripped and fell the other day and he has some back pain.  He has hx of CAD tx'd medically.  He has had cardiac cath that showed no significant CAD. H has hx of GERD which is controlled with ppi. He has no other acute problems.   Review of Systems C/o back pain   No chest pain, SOB, HA, dizziness, vision change, N/V, diarrhea, constipation, dysuria, urinary urgency or frequency or rash.  Objective:   Physical Exam Vital signs noted  Well developed well nourished male.  HEENT - Head atraumatic Normocephalic                Eyes - PERRLA, Conjuctiva - clear Sclera- Clear EOMI                Ears - EAC's Wnl TM's Wnl Gross Hearing WNL                Nose - Nares patent                 Throat - oropharanx wnl Respiratory - Lungs CTA bilateral Cardiac - RRR S1 and S2 without murmur GI - Abdomen soft Nontender and bowel sounds active x 4 Extremities - No edema. Neuro - Grossly intact.       Assessment & Plan:  Routine general medical examination at a health care facility - Plan: POCT glycosylated hemoglobin (Hb A1C), Lipid panel, Lipid panel, BMP8+EGFR, Thyroid Panel With TSH, POCT CBC, Vit D  25 hydroxy (rtn osteoporosis monitoring), PSA, total and free  HLD (hyperlipidemia) - Plan: fenofibrate 54 MG tablet  Left-sided low back pain without sciatica - Plan: cyclobenzaprine (FLEXERIL) 10 MG tablet  Type II or unspecified type diabetes mellitus with unspecified complication, uncontrolled - Plan: Canagliflozin (INVOKANA) 100 MG TABS, glimepiride (AMARYL) 4 MG tablet, glucose blood test strip, ONETOUCH DELICA LANCETS FINE MISC.  He has lost 16 pounds and congrats and keep up good work.  Other and unspecified hyperlipidemia - Plan: fenofibrate 54 MG tablet  History of tobacco  abuse - Plan: DG Chest 2 View  Gastroesophageal reflux disease without esophagitis - Plan: esomeprazole (NEXIUM) 40 MG capsule  Follow up in 3 months  Lysbeth Penner FNP

## 2014-05-17 NOTE — Progress Notes (Addendum)
   Subjective:    Patient ID: David Black, male    DOB: Aug 11, 1961, 53 y.o.   MRN: [id number]  HPI He c/o of chronic cough.  He has been on PPI but denies any GERD sx's. HE has hx of smoking but quit.  He smoked total of 20 years and quit 10 years ago.  He has been having persistent cough.   Review of Systems C/o cough No chest pain, SOB, HA, dizziness, vision change, N/V, diarrhea, constipation, dysuria, urinary urgency or frequency, myalgias, arthralgias or rash.     Objective:   Physical Exam    cxr - No infiltrates Prelimnary reading by Iverson Alamin    Assessment & Plan:  Cough - CXR Lysbeth Penner FNP

## 2014-05-18 LAB — LIPID PANEL
Chol/HDL Ratio: 6.2 ratio units — ABNORMAL HIGH (ref 0.0–5.0)
Cholesterol, Total: 154 mg/dL (ref 100–199)
HDL: 25 mg/dL — ABNORMAL LOW (ref >39)
LDL Calculated: 96 mg/dL (ref 0–99)
Triglycerides: 165 mg/dL — ABNORMAL HIGH (ref 0–149)
VLDL Cholesterol Cal: 33 mg/dL (ref 5–40)

## 2014-05-18 LAB — BMP8+EGFR
BUN/Creatinine Ratio: 14 (ref 9–20)
BUN: 17 mg/dL (ref 6–24)
CO2: 24 mmol/L (ref 18–29)
Calcium: 9.6 mg/dL (ref 8.7–10.2)
Chloride: 101 mmol/L (ref 97–108)
Creatinine, Ser: 1.21 mg/dL (ref 0.76–1.27)
GFR calc Af Amer: 80 mL/min/{1.73_m2} (ref >59)
GFR calc non Af Amer: 69 mL/min/{1.73_m2} (ref >59)
Glucose: 108 mg/dL — ABNORMAL HIGH (ref 65–99)
Potassium: 4.3 mmol/L (ref 3.5–5.2)
Sodium: 141 mmol/L (ref 134–144)

## 2014-05-18 LAB — THYROID PANEL WITH TSH
Free Thyroxine Index: 2.3 (ref 1.2–4.9)
T3 Uptake Ratio: 31 % (ref 24–39)
T4, Total: 7.4 ug/dL (ref 4.5–12.0)
TSH: 1.47 u[IU]/mL (ref 0.450–4.500)

## 2014-05-18 LAB — PSA, TOTAL AND FREE
PSA, Free Pct: 28.6 %
PSA, Free: 0.2 ng/mL
PSA: 0.7 ng/mL (ref 0.0–4.0)

## 2014-05-18 LAB — VITAMIN D 25 HYDROXY (VIT D DEFICIENCY, FRACTURES): Vit D, 25-Hydroxy: 14.7 ng/mL — ABNORMAL LOW (ref 30.0–100.0)

## 2014-05-19 ENCOUNTER — Encounter: Payer: Self-pay | Admitting: Family Medicine

## 2014-05-20 ENCOUNTER — Other Ambulatory Visit: Payer: Self-pay | Admitting: Family Medicine

## 2014-05-20 MED ORDER — VITAMIN D (ERGOCALCIFEROL) 1.25 MG (50000 UNIT) PO CAPS: 50000.0000 [IU] | ORAL_CAPSULE | ORAL | Status: DC

## 2014-06-22 ENCOUNTER — Other Ambulatory Visit: Payer: Self-pay | Admitting: Family Medicine

## 2014-06-22 MED ORDER — PHENTERMINE-TOPIRAMATE ER 7.5-46 MG PO CP24: ORAL_CAPSULE | ORAL | Status: DC

## 2014-06-22 MED ORDER — PHENTERMINE-TOPIRAMATE ER 3.75-23 MG PO CP24: ORAL_CAPSULE | ORAL | Status: DC

## 2014-06-22 MED ORDER — PHENTERMINE-TOPIRAMATE ER 7.5-46 MG PO CP24: 46.0000 mg | ORAL_CAPSULE | ORAL | Status: DC

## 2014-06-22 MED ORDER — PHENTERMINE-TOPIRAMATE ER 3.75-23 MG PO CP24: 3.7500 mg | ORAL_CAPSULE | Freq: Once | ORAL | Status: DC

## 2014-07-06 ENCOUNTER — Other Ambulatory Visit: Payer: Self-pay | Admitting: Family Medicine

## 2014-08-09 ENCOUNTER — Other Ambulatory Visit: Payer: Self-pay | Admitting: Family Medicine

## 2014-08-09 NOTE — Telephone Encounter (Signed)
Last seen and last Vit D 05/17/14  [facility] [place]   14.7 low

## 2014-08-17 ENCOUNTER — Encounter: Payer: Self-pay | Admitting: Family Medicine

## 2014-08-17 ENCOUNTER — Ambulatory Visit (INDEPENDENT_AMBULATORY_CARE_PROVIDER_SITE_OTHER): Payer: 59 | Admitting: Family Medicine

## 2014-08-17 ENCOUNTER — Encounter (INDEPENDENT_AMBULATORY_CARE_PROVIDER_SITE_OTHER): Payer: Self-pay

## 2014-08-17 VITALS — BP 122/68 | HR 79 | Temp 97.7°F | Ht 69.0 in | Wt 272.0 lb

## 2014-08-17 DIAGNOSIS — E119 Type 2 diabetes mellitus without complications: Secondary | ICD-10-CM

## 2014-08-17 DIAGNOSIS — G629 Polyneuropathy, unspecified: Secondary | ICD-10-CM

## 2014-08-17 DIAGNOSIS — E785 Hyperlipidemia, unspecified: Secondary | ICD-10-CM

## 2014-08-17 DIAGNOSIS — R040 Epistaxis: Secondary | ICD-10-CM

## 2014-08-17 LAB — POCT CBC
Granulocyte percent: 63.4 %G (ref 37–80)
HCT, POC: 48.6 % (ref 43.5–53.7)
Hemoglobin: 15.1 g/dL (ref 14.1–18.1)
Lymph, poc: 1.8 (ref 0.6–3.4)
MCH, POC: 28 pg (ref 27–31.2)
MCHC: 31.2 g/dL — AB (ref 31.8–35.4)
MCV: 89.9 fL (ref 80–97)
MPV: 8.1 fL (ref 0–99.8)
POC Granulocyte: 4.1 (ref 2–6.9)
POC LYMPH PERCENT: 28.7 %L (ref 10–50)
Platelet Count, POC: 210 10*3/uL (ref 142–424)
RBC: 5.4 M/uL (ref 4.69–6.13)
RDW, POC: 14 %
WBC: 6.4 10*3/uL (ref 4.6–10.2)

## 2014-08-17 LAB — POCT GLYCOSYLATED HEMOGLOBIN (HGB A1C): Hemoglobin A1C: 6.1

## 2014-08-17 LAB — POCT UA - MICROALBUMIN: Microalbumin Ur, POC: NEGATIVE mg/L

## 2014-08-17 MED ORDER — CANAGLIFLOZIN 300 MG PO TABS: 300.0000 mg | ORAL_TABLET | Freq: Every day | ORAL | Status: DC

## 2014-08-17 MED ORDER — VITAMIN D (ERGOCALCIFEROL) 1.25 MG (50000 UNIT) PO CAPS: ORAL_CAPSULE | ORAL | Status: DC

## 2014-08-17 MED ORDER — GABAPENTIN 300 MG PO CAPS: ORAL_CAPSULE | ORAL | Status: DC

## 2014-08-17 NOTE — Progress Notes (Signed)
   Subjective:    Patient ID: David Black, male    DOB: 07/02/1961, 53 y.o.   MRN: [id number]  HPI Patient is here for follow up.  He has hx of diabetes, GERD, HTN, and diabetic neuropathy.  He has hx of vitamin D deficiency.  He is doing a lot better on gabapentin.  Review of Systems  Constitutional: Negative for fever.  HENT: Negative for ear pain.   Eyes: Negative for discharge.  Respiratory: Negative for cough.   Cardiovascular: Negative for chest pain.  Gastrointestinal: Negative for abdominal distention.  Endocrine: Negative for polyuria.  Genitourinary: Negative for difficulty urinating.  Musculoskeletal: Negative for gait problem and neck pain.  Skin: Negative for color change and rash.  Neurological: Negative for speech difficulty and headaches.  Psychiatric/Behavioral: Negative for agitation.       Objective:    BP 122/68 mmHg  Pulse 79  Temp(Src) 97.7 F (36.5 C) (Oral)  Ht $R'5\' 9"'gA$  (1.753 m)  Wt 272 lb (123.378 kg)  BMI 40.15 kg/m2 Physical Exam  Constitutional: He is oriented to person, place, and time. He appears well-developed and well-nourished.  HENT:  Head: Normocephalic and atraumatic.  Mouth/Throat: Oropharynx is clear and moist.  Eyes: Pupils are equal, round, and reactive to light.  Neck: Normal range of motion. Neck supple.  Cardiovascular: Normal rate and regular rhythm.   No murmur heard. Pulmonary/Chest: Effort normal and breath sounds normal.  Abdominal: Soft. Bowel sounds are normal. There is no tenderness.  Neurological: He is alert and oriented to person, place, and time.  Skin: Skin is warm and dry.  Psychiatric: He has a normal mood and affect.          Assessment & Plan:     ICD-9-CM ICD-10-CM   1. Diabetes mellitus type 2, controlled, without complications 643.32 R51.8 POCT glycosylated hemoglobin (Hb A1C)     POCT UA - Microalbumin     CMP14+EGFR  2. Frequent nosebleeds 784.7 R04.0 POCT CBC  3. Hyperlipemia 272.4 E78.5  CMP14+EGFR     Lipid panel  4. Neuropathy 355.9 G62.9 CMP14+EGFR     Return in about 3 months (around 11/16/2014).  Lysbeth Penner FNP

## 2014-08-18 LAB — CMP14+EGFR
ALT: 25 IU/L (ref 0–44)
AST: 23 IU/L (ref 0–40)
Albumin/Globulin Ratio: 1.8 (ref 1.1–2.5)
Albumin: 4.4 g/dL (ref 3.5–5.5)
Alkaline Phosphatase: 41 IU/L (ref 39–117)
BUN/Creatinine Ratio: 15 (ref 9–20)
BUN: 19 mg/dL (ref 6–24)
CO2: 23 mmol/L (ref 18–29)
Calcium: 9.5 mg/dL (ref 8.7–10.2)
Chloride: 102 mmol/L (ref 97–108)
Creatinine, Ser: 1.26 mg/dL (ref 0.76–1.27)
GFR calc Af Amer: 76 mL/min/{1.73_m2} (ref >59)
GFR calc non Af Amer: 66 mL/min/{1.73_m2} (ref >59)
Globulin, Total: 2.5 g/dL (ref 1.5–4.5)
Glucose: 115 mg/dL — ABNORMAL HIGH (ref 65–99)
Potassium: 4.1 mmol/L (ref 3.5–5.2)
Sodium: 139 mmol/L (ref 134–144)
Total Bilirubin: 0.2 mg/dL (ref 0.0–1.2)
Total Protein: 6.9 g/dL (ref 6.0–8.5)

## 2014-08-18 LAB — LIPID PANEL
Chol/HDL Ratio: 7 ratio units — ABNORMAL HIGH (ref 0.0–5.0)
Cholesterol, Total: 154 mg/dL (ref 100–199)
HDL: 22 mg/dL — ABNORMAL LOW (ref >39)
LDL Calculated: 87 mg/dL (ref 0–99)
Triglycerides: 225 mg/dL — ABNORMAL HIGH (ref 0–149)
VLDL Cholesterol Cal: 45 mg/dL — ABNORMAL HIGH (ref 5–40)

## 2014-09-17 ENCOUNTER — Encounter: Payer: Self-pay | Admitting: Gastroenterology

## 2014-09-29 ENCOUNTER — Other Ambulatory Visit: Payer: Self-pay | Admitting: Nurse Practitioner

## 2015-01-15 ENCOUNTER — Other Ambulatory Visit: Payer: Self-pay | Admitting: Family Medicine

## 2015-02-15 ENCOUNTER — Ambulatory Visit (INDEPENDENT_AMBULATORY_CARE_PROVIDER_SITE_OTHER): Payer: BLUE CROSS/BLUE SHIELD | Admitting: Family Medicine

## 2015-02-15 ENCOUNTER — Encounter (INDEPENDENT_AMBULATORY_CARE_PROVIDER_SITE_OTHER): Payer: Self-pay

## 2015-02-15 ENCOUNTER — Encounter: Payer: Self-pay | Admitting: Family Medicine

## 2015-02-15 VITALS — BP 109/70 | HR 76 | Temp 96.8°F | Ht 69.0 in | Wt 263.4 lb

## 2015-02-15 DIAGNOSIS — E119 Type 2 diabetes mellitus without complications: Secondary | ICD-10-CM

## 2015-02-15 DIAGNOSIS — M545 Low back pain, unspecified: Secondary | ICD-10-CM

## 2015-02-15 DIAGNOSIS — I10 Essential (primary) hypertension: Secondary | ICD-10-CM

## 2015-02-15 DIAGNOSIS — E785 Hyperlipidemia, unspecified: Secondary | ICD-10-CM | POA: Diagnosis not present

## 2015-02-15 DIAGNOSIS — E559 Vitamin D deficiency, unspecified: Secondary | ICD-10-CM | POA: Diagnosis not present

## 2015-02-15 DIAGNOSIS — K219 Gastro-esophageal reflux disease without esophagitis: Secondary | ICD-10-CM

## 2015-02-15 LAB — POCT CBC
Granulocyte percent: 68.7 %G (ref 37–80)
HEMATOCRIT: 48 % (ref 43.5–53.7)
HEMOGLOBIN: 15.5 g/dL (ref 14.1–18.1)
LYMPH, POC: 2 (ref 0.6–3.4)
MCH: 29.3 pg (ref 27–31.2)
MCHC: 32.4 g/dL (ref 31.8–35.4)
MCV: 90.5 fL (ref 80–97)
MPV: 7.6 fL (ref 0–99.8)
POC GRANULOCYTE: 4.9 (ref 2–6.9)
POC LYMPH %: 27.4 % (ref 10–50)
Platelet Count, POC: 202 10*3/uL (ref 142–424)
RBC: 5.3 M/uL (ref 4.69–6.13)
RDW, POC: 14 %
WBC: 7.2 10*3/uL (ref 4.6–10.2)

## 2015-02-15 LAB — POCT GLYCOSYLATED HEMOGLOBIN (HGB A1C): Hemoglobin A1C: 5.8

## 2015-02-15 MED ORDER — CYCLOBENZAPRINE HCL 10 MG PO TABS: 10.0000 mg | ORAL_TABLET | Freq: Three times a day (TID) | ORAL | Status: DC | PRN

## 2015-02-15 MED ORDER — CANAGLIFLOZIN 100 MG PO TABS: 100.0000 mg | ORAL_TABLET | Freq: Every day | ORAL | Status: DC

## 2015-02-15 NOTE — Progress Notes (Signed)
Subjective:  Patient ID: David Black, male    DOB: 1961-01-27  Age: 54 y.o. MRN: [id number]  CC: Diabetes; Hypertension; Hyperlipidemia; and Vit D deficiency   HPI BODEN STUCKY presents for  follow-up of hypertension. Patient has no history of headache chest pain or shortness of breath or recent cough. Patient also denies symptoms of TIA such as numbness weakness lateralizing. Patient checks  blood pressure at home and has not had any elevated readings recently. Patient denies side effects from his medication. States taking it regularly.  Patient also  in for follow-up of elevated cholesterol. Doing well without complaints on current medication. Denies side effects of statin including myalgia and arthralgia and nausea. Also in today for liver function testing. Currently no chest pain, shortness of breath or other cardiovascular related symptoms noted.  Follow-up of diabetes. Patient does check blood sugar at home. Readings run between 80 and 200. Most in the low 100s. Log returned, reviewed with Pt. And attached Patient denies symptoms such as polyuria, polydipsia, excessive hunger, nausea No significant hypoglycemic spells noted. Medications as noted below. Taking them regularly without complication/adverse reaction being reported today.    History Yash has a past medical history of GERD (gastroesophageal reflux disease); Obesity, unspecified; CAD (coronary artery disease); Depression with anxiety; Dyslipidemia; Hypertension; Smoker; SOB (shortness of breath) (07/18/2010); Chest discomfort (07/18/2010); Hyperlipidemia; and Diabetes mellitus without complication.   He has past surgical history that includes Ventral hernia repair (06/2010); Cholecystectomy; and Hernia repair.   His family history includes Breast cancer in his paternal aunt; Coronary artery disease in his other; Diabetes in his mother; Heart disease in his father and paternal uncle; Hyperlipidemia in his father and  mother; Hypertension in his father and mother; Kidney disease in his father; Testicular cancer in his paternal uncle.He reports that he quit smoking about 12 years ago. He does not have any smokeless tobacco history on file. He reports that he does not drink alcohol or use illicit drugs.  Current Outpatient Prescriptions on File Prior to Visit  Medication Sig Dispense Refill  . aspirin 81 MG tablet Take 81 mg by mouth daily.      . fenofibrate 54 MG tablet Take 1 tablet (54 mg total) by mouth daily. 30 tablet 1  . gabapentin (NEURONTIN) 300 MG capsule TAKE (1) CAPSULE THREE TIMES DAILY. 90 capsule 11  . glimepiride (AMARYL) 4 MG tablet Take 1 tablet (4 mg total) by mouth daily with breakfast. 30 tablet 11  . glucose blood test strip 1 each by Other route 2 (two) times daily. 100 each 5  . meloxicam (MOBIC) 15 MG tablet Take 1 tablet (15 mg total) by mouth daily. 30 tablet 11  . ONETOUCH DELICA LANCETS FINE MISC 1 each by Does not apply route 2 (two) times daily. 100 each 5  . pantoprazole (PROTONIX) 40 MG tablet Take 1 tablet (40 mg total) by mouth daily. 30 tablet 11  . Phentermine-Topiramate (QSYMIA) 7.5-46 MG CP24 One po qd 30 capsule 3  . valsartan-hydrochlorothiazide (DIOVAN-HCT) 320-25 MG per tablet TAKE 1 TABLET DAILY (Patient taking differently: 0.5 tablets. TAKE 1 TABLET DAILY) 30 tablet 6   No current facility-administered medications on file prior to visit.    ROS Review of Systems  Constitutional: Negative for fever, chills and diaphoresis.  HENT: Negative for congestion, rhinorrhea and sore throat.   Respiratory: Negative for cough, shortness of breath and wheezing.   Cardiovascular: Negative for chest pain.  Gastrointestinal: Negative for nausea, vomiting, abdominal  pain, diarrhea, constipation and abdominal distention.  Genitourinary: Negative for dysuria and frequency.  Musculoskeletal: Positive for back pain (intermittent. Relief with flexeril). Negative for joint swelling  and arthralgias.  Skin: Negative for rash.  Neurological: Negative for headaches.    Objective:  BP 109/70 mmHg  Pulse 76  Temp(Src) 96.8 F (36 C) (Oral)  Ht 5' 9"  (1.753 m)  Wt 263 lb 6.4 oz (119.477 kg)  BMI 38.88 kg/m2  BP Readings from Last 3 Encounters:  02/15/15 109/70  08/17/14 122/68  05/17/14 134/84    Wt Readings from Last 3 Encounters:  02/15/15 263 lb 6.4 oz (119.477 kg)  08/17/14 272 lb (123.378 kg)  05/17/14 268 lb (121.564 kg)     Physical Exam  Constitutional: He is oriented to person, place, and time. He appears well-developed and well-nourished. No distress.  HENT:  Head: Normocephalic and atraumatic.  Right Ear: External ear normal.  Left Ear: External ear normal.  Nose: Nose normal.  Mouth/Throat: Oropharynx is clear and moist.  Eyes: Conjunctivae and EOM are normal. Pupils are equal, round, and reactive to light.  Neck: Normal range of motion. Neck supple. No thyromegaly present.  Cardiovascular: Normal rate, regular rhythm and normal heart sounds.   No murmur heard. Pulmonary/Chest: Effort normal and breath sounds normal. No respiratory distress. He has no wheezes. He has no rales.  Abdominal: Soft. Bowel sounds are normal. He exhibits no distension. There is no tenderness.  Lymphadenopathy:    He has no cervical adenopathy.  Neurological: He is alert and oriented to person, place, and time. He has normal reflexes.  Skin: Skin is warm and dry.  Psychiatric: He has a normal mood and affect. His behavior is normal. Judgment and thought content normal.    Lab Results  Component Value Date   HGBA1C 5.8 02/15/2015   HGBA1C 6.1 08/17/2014   HGBA1C 6.0% 05/17/2014    Lab Results  Component Value Date   WBC 7.2 02/15/2015   HGB 15.5 02/15/2015   HCT 48.0 02/15/2015   PLT 201 09/12/2013   GLUCOSE 115* 08/17/2014   CHOL 154 08/17/2014   TRIG 225* 08/17/2014   HDL 22* 08/17/2014   LDLCALC 87 08/17/2014   ALT 25 08/17/2014   AST 23  08/17/2014   NA 139 08/17/2014   K 4.1 08/17/2014   CL 102 08/17/2014   CREATININE 1.26 08/17/2014   BUN 19 08/17/2014   CO2 23 08/17/2014   TSH 1.470 05/17/2014   PSA 0.7 05/17/2014   INR 0.98 09/12/2013   HGBA1C 5.8 02/15/2015    No results found.  Assessment & Plan:   Wilfrido was seen today for diabetes, hypertension, hyperlipidemia and vit d deficiency.  Diagnoses and all orders for this visit:  Diabetes mellitus without complication Orders: -     POCT glycosylated hemoglobin (Hb A1C); Standing -     POCT glycosylated hemoglobin (Hb A1C)  Hyperlipemia Orders: -     Lipid panel; Standing -     Lipid panel  Essential hypertension Orders: -     POCT CBC -     CMP14+EGFR; Standing -     CMP14+EGFR  Gastroesophageal reflux disease without esophagitis Orders: -     POCT CBC  Vitamin D deficiency Orders: -     Vit D  25 hydroxy (rtn osteoporosis monitoring)  Left-sided low back pain without sciatica Orders: -     cyclobenzaprine (FLEXERIL) 10 MG tablet; Take 1 tablet (10 mg total) by mouth 3 (three) times  daily as needed for muscle spasms.  Other orders -     canagliflozin (INVOKANA) 100 MG TABS tablet; Take 1 tablet (100 mg total) by mouth daily.   I have discontinued Mr. Kristiansen esomeprazole, Vitamin D (Ergocalciferol), and canagliflozin. I am also having him start on canagliflozin. Additionally, I am having him maintain his aspirin, pantoprazole, valsartan-hydrochlorothiazide, glimepiride, meloxicam, glucose blood, ONETOUCH DELICA LANCETS FINE, Phentermine-Topiramate, gabapentin, fenofibrate, and cyclobenzaprine.  Meds ordered this encounter  Medications  . canagliflozin (INVOKANA) 100 MG TABS tablet    Sig: Take 1 tablet (100 mg total) by mouth daily.    Dispense:  30 tablet    Refill:  5  . cyclobenzaprine (FLEXERIL) 10 MG tablet    Sig: Take 1 tablet (10 mg total) by mouth 3 (three) times daily as needed for muscle spasms.    Dispense:  30 tablet     Refill:  0   Continue weight loss. Goal is 20 lb over 6 mos.   Follow-up: Return in about 6 months (around 08/17/2015) for diabetes, cholesterol.  Claretta Fraise, M.D.

## 2015-02-16 LAB — LIPID PANEL
CHOL/HDL RATIO: 6.9 ratio — AB (ref 0.0–5.0)
Cholesterol, Total: 172 mg/dL (ref 100–199)
HDL: 25 mg/dL — ABNORMAL LOW (ref >39)
LDL CALC: 105 mg/dL — AB (ref 0–99)
TRIGLYCERIDES: 212 mg/dL — AB (ref 0–149)
VLDL Cholesterol Cal: 42 mg/dL — ABNORMAL HIGH (ref 5–40)

## 2015-02-16 LAB — CMP14+EGFR
A/G RATIO: 1.8 (ref 1.1–2.5)
ALBUMIN: 4.3 g/dL (ref 3.5–5.5)
ALT: 27 IU/L (ref 0–44)
AST: 22 IU/L (ref 0–40)
Alkaline Phosphatase: 37 IU/L — ABNORMAL LOW (ref 39–117)
BUN/Creatinine Ratio: 15 (ref 9–20)
BUN: 19 mg/dL (ref 6–24)
Bilirubin Total: 0.4 mg/dL (ref 0.0–1.2)
CO2: 25 mmol/L (ref 18–29)
CREATININE: 1.25 mg/dL (ref 0.76–1.27)
Calcium: 9.7 mg/dL (ref 8.7–10.2)
Chloride: 100 mmol/L (ref 97–108)
GFR calc Af Amer: 77 mL/min/{1.73_m2} (ref >59)
GFR, EST NON AFRICAN AMERICAN: 66 mL/min/{1.73_m2} (ref >59)
GLUCOSE: 127 mg/dL — AB (ref 65–99)
Globulin, Total: 2.4 g/dL (ref 1.5–4.5)
Potassium: 4.4 mmol/L (ref 3.5–5.2)
Sodium: 141 mmol/L (ref 134–144)
TOTAL PROTEIN: 6.7 g/dL (ref 6.0–8.5)

## 2015-02-16 LAB — VITAMIN D 25 HYDROXY (VIT D DEFICIENCY, FRACTURES): VIT D 25 HYDROXY: 24.2 ng/mL — AB (ref 30.0–100.0)

## 2015-02-23 ENCOUNTER — Telehealth: Payer: Self-pay | Admitting: *Deleted

## 2015-02-23 MED ORDER — VITAMIN D (ERGOCALCIFEROL) 1.25 MG (50000 UNIT) PO CAPS: 50000.0000 [IU] | ORAL_CAPSULE | ORAL | Status: DC

## 2015-02-23 NOTE — Telephone Encounter (Signed)
Rx sent in for Vit D

## 2015-03-14 ENCOUNTER — Encounter: Payer: Self-pay | Admitting: Family

## 2015-03-14 ENCOUNTER — Ambulatory Visit: Payer: BLUE CROSS/BLUE SHIELD | Admitting: Family Medicine

## 2015-03-14 ENCOUNTER — Ambulatory Visit (INDEPENDENT_AMBULATORY_CARE_PROVIDER_SITE_OTHER): Payer: BLUE CROSS/BLUE SHIELD | Admitting: Family

## 2015-03-14 VITALS — BP 121/79 | HR 88 | Temp 98.0°F | Ht 69.0 in | Wt 260.0 lb

## 2015-03-14 DIAGNOSIS — M545 Low back pain: Secondary | ICD-10-CM | POA: Diagnosis not present

## 2015-03-14 MED ORDER — TRAMADOL HCL 50 MG PO TABS: 50.0000 mg | ORAL_TABLET | Freq: Three times a day (TID) | ORAL | Status: DC | PRN

## 2015-03-14 MED ORDER — KETOROLAC TROMETHAMINE 60 MG/2ML IM SOLN
60.0000 mg | Freq: Once | INTRAMUSCULAR | Status: AC
  Administered 2015-03-14: 60 mg via INTRAMUSCULAR

## 2015-03-14 MED ORDER — MELOXICAM 15 MG PO TABS: 15.0000 mg | ORAL_TABLET | Freq: Every day | ORAL | Status: DC

## 2015-03-14 MED ORDER — METHYLPREDNISOLONE ACETATE 80 MG/ML IJ SUSP
40.0000 mg | Freq: Once | INTRAMUSCULAR | Status: AC
  Administered 2015-03-14: 40 mg via INTRAMUSCULAR

## 2015-03-14 NOTE — Patient Instructions (Signed)
Back Pain, Adult Low back pain is very common. About 1 in 5 people have back pain.The cause of low back pain is rarely dangerous. The pain often gets better over time.About half of people with a sudden onset of back pain feel better in just 2 weeks. About 8 in 10 people feel better by 6 weeks.  CAUSES Some common causes of back pain include:  Strain of the muscles or ligaments supporting the spine.  Wear and tear (degeneration) of the spinal discs.  Arthritis.  Direct injury to the back. DIAGNOSIS Most of the time, the direct cause of low back pain is not known.However, back pain can be treated effectively even when the exact cause of the pain is unknown.Answering your caregiver's questions about your overall health and symptoms is one of the most accurate ways to make sure the cause of your pain is not dangerous. If your caregiver needs more information, he or she may order lab work or imaging tests (X-rays or MRIs).However, even if imaging tests show changes in your back, this usually does not require surgery. HOME CARE INSTRUCTIONS For many people, back pain returns.Since low back pain is rarely dangerous, it is often a condition that people can learn to manageon their own.   Remain active. It is stressful on the back to sit or stand in one place. Do not sit, drive, or stand in one place for more than 30 minutes at a time. Take short walks on level surfaces as soon as pain allows.Try to increase the length of time you walk each day.  Do not stay in bed.Resting more than 1 or 2 days can delay your recovery.  Do not avoid exercise or work.Your body is made to move.It is not dangerous to be active, even though your back may hurt.Your back will likely heal faster if you return to being active before your pain is gone.  Pay attention to your body when you bend and lift. Many people have less discomfortwhen lifting if they bend their knees, keep the load close to their bodies,and  avoid twisting. Often, the most comfortable positions are those that put less stress on your recovering back.  Find a comfortable position to sleep. Use a firm mattress and lie on your side with your knees slightly bent. If you lie on your back, put a pillow under your knees.  Only take over-the-counter or prescription medicines as directed by your caregiver. Over-the-counter medicines to reduce pain and inflammation are often the most helpful.Your caregiver may prescribe muscle relaxant drugs.These medicines help dull your pain so you can more quickly return to your normal activities and healthy exercise.  Put ice on the injured area.  Put ice in a plastic bag.  Place a towel between your skin and the bag.  Leave the ice on for 15-20 minutes, 03-04 times a day for the first 2 to 3 days. After that, ice and heat may be alternated to reduce pain and spasms.  Ask your caregiver about trying back exercises and gentle massage. This may be of some benefit.  Avoid feeling anxious or stressed.Stress increases muscle tension and can worsen back pain.It is important to recognize when you are anxious or stressed and learn ways to manage it.Exercise is a great option. SEEK MEDICAL CARE IF:  You have pain that is not relieved with rest or medicine.  You have pain that does not improve in 1 week.  You have new symptoms.  You are generally not feeling well. SEEK   IMMEDIATE MEDICAL CARE IF:   You have pain that radiates from your back into your legs.  You develop new bowel or bladder control problems.  You have unusual weakness or numbness in your arms or legs.  You develop nausea or vomiting.  You develop abdominal pain.  You feel faint. Document Released: 08/06/2005 Document Revised: 02/05/2012 Document Reviewed: 12/08/2013 Exit[employer] Patient Information 2015 [employer]. This information is not intended to replace advice given to you by your health care provider. Make sure you  discuss any questions you have with your health care provider.  

## 2015-03-14 NOTE — Progress Notes (Signed)
   Subjective:    Patient ID: David Black, male    DOB: 07/09/1961, 54 y.o.   MRN: [id number]  Back Pain This is a new problem. The current episode started in the past 7 days. The problem occurs constantly. The problem is unchanged. The pain is present in the lumbar spine. The quality of the pain is described as aching. The pain is at a severity of 9/10. The pain is moderate. The symptoms are aggravated by bending and lying down. Pertinent negatives include no bladder incontinence, bowel incontinence, dysuria, leg pain, numbness or tingling. He has tried muscle relaxant and NSAIDs for the symptoms. The treatment provided mild relief.      Review of Systems  Constitutional: Negative.   HENT: Negative.   Respiratory: Negative.   Cardiovascular: Negative.   Gastrointestinal: Negative.  Negative for bowel incontinence.  Endocrine: Negative.   Genitourinary: Negative.  Negative for bladder incontinence and dysuria.  Musculoskeletal: Positive for back pain.  Neurological: Negative.  Negative for tingling and numbness.  Hematological: Negative.   Psychiatric/Behavioral: Negative.   All other systems reviewed and are negative.      Objective:   Physical Exam  Constitutional: He is oriented to person, place, and time. He appears well-developed and well-nourished. No distress.  HENT:  Head: Normocephalic.  Right Ear: External ear normal.  Left Ear: External ear normal.  Nose: Nose normal.  Mouth/Throat: Oropharynx is clear and moist.  Eyes: Pupils are equal, round, and reactive to light. Right eye exhibits no discharge. Left eye exhibits no discharge.  Neck: Normal range of motion. Neck supple. No thyromegaly present.  Cardiovascular: Normal rate, regular rhythm, normal heart sounds and intact distal pulses.   No murmur heard. Pulmonary/Chest: Effort normal and breath sounds normal. No respiratory distress. He has no wheezes.  Abdominal: Soft. Bowel sounds are normal. He exhibits no  distension. There is no tenderness.  Musculoskeletal: Normal range of motion. He exhibits no edema or tenderness.  Neurological: He is alert and oriented to person, place, and time. He has normal reflexes. No cranial nerve deficit.  Skin: Skin is warm and dry. No rash noted. No erythema.  Psychiatric: He has a normal mood and affect. His behavior is normal. Judgment and thought content normal.  Vitals reviewed.  BP 121/79 mmHg  Pulse 88  Temp(Src) 98 F (36.7 C) (Oral)  Ht 5\' 9"  (1.753 m)  Wt 260 lb (117.935 kg)  BMI 38.38 kg/m2        Assessment & Plan:  1. Bilateral low back pain, with sciatica presence unspecified -Rest -Ice and heat as needed -Pt to take Flexeril rx as needed -No other NSAID's while taking Mobic -Pt to monitor BS r/t to steroid -RTO prn - methylPREDNISolone acetate (DEPO-MEDROL) injection 40 mg; Inject 0.5 mLs (40 mg total) into the muscle once. - ketorolac (TORADOL) injection 60 mg; Inject 2 mLs (60 mg total) into the muscle once. - meloxicam (MOBIC) 15 MG tablet; Take 1 tablet (15 mg total) by mouth daily.  Dispense: 30 tablet; Refill: 4 - traMADol (ULTRAM) 50 MG tablet; Take 1 tablet (50 mg total) by mouth every 8 (eight) hours as needed.  Dispense: 60 tablet; Refill: 0   Evelina Dun, FNP

## 2015-03-17 ENCOUNTER — Other Ambulatory Visit: Payer: Self-pay | Admitting: Physician Assistant

## 2015-03-18 ENCOUNTER — Other Ambulatory Visit: Payer: Self-pay | Admitting: Physician Assistant

## 2015-05-12 ENCOUNTER — Encounter: Payer: Self-pay | Admitting: Gastroenterology

## 2015-05-18 ENCOUNTER — Ambulatory Visit (INDEPENDENT_AMBULATORY_CARE_PROVIDER_SITE_OTHER): Payer: BLUE CROSS/BLUE SHIELD | Admitting: Family Medicine

## 2015-05-18 ENCOUNTER — Ambulatory Visit: Payer: BLUE CROSS/BLUE SHIELD | Admitting: Family Medicine

## 2015-05-18 VITALS — BP 117/83 | HR 68 | Temp 97.4°F | Ht 69.0 in | Wt 261.0 lb

## 2015-05-18 DIAGNOSIS — L600 Ingrowing nail: Secondary | ICD-10-CM | POA: Diagnosis not present

## 2015-05-18 MED ORDER — ACETAMINOPHEN-CODEINE #3 300-30 MG PO TABS: 1.0000 | ORAL_TABLET | ORAL | Status: DC | PRN

## 2015-05-18 MED ORDER — AMOXICILLIN-POT CLAVULANATE 875-125 MG PO TABS: 1.0000 | ORAL_TABLET | Freq: Two times a day (BID) | ORAL | Status: DC

## 2015-05-18 NOTE — Patient Instructions (Signed)
Toenail Removal Toenails may need to be removed because of injury, infections, or to correct abnormal growth. A special non-stick bandage will likely be put tightly on your toe to prevent bleeding. Often times a new nail will grow back. Sometimes the new nail may be deformed. Most of the time when a nail is lost, it will gradually heal, but may be sensitive for a long time. HOME CARE INSTRUCTIONS   Keep your foot elevated to relieve pain and swelling. This will require lying in bed or on a couch with the leg on pillows or sitting in a recliner with the leg up. Walking or letting your leg dangle may increase swelling, slow healing, and cause throbbing pain.  Keep your bandage dry and clean.  Change your bandage in 24 hours.  After your bandage is changed, soak your foot in warm, soapy water for 10 to 20 minutes. Do this 3 times per day. This helps reduce pain and swelling. After soaking your foot apply a clean, dry bandage. Change your bandage if it is wet or dirty.  Only take over-the-counter or prescription medicines for pain, discomfort, or fever as directed by your caregiver.  See your caregiver as needed for problems. You might need a tetanus shot now if:  You have no idea when you had the last one.  You have never had a tetanus shot before.  The injured area had dirt in it. If you need a tetanus shot, and you decide not to get one, there is a rare chance of getting tetanus. Sickness from tetanus can be serious. If you did get a tetanus shot, your arm may swell, get red and warm to the touch at the shot site. This is common and not a problem. SEEK IMMEDIATE MEDICAL CARE IF:   You have increased pain, swelling, redness, warmth, drainage, or bleeding.  You have a fever.  You have swelling that spreads from your toe into your foot. Document Released: 05/05/2003 Document Revised: 10/29/2011 Document Reviewed: 08/16/2008 Exit[employer] Patient Information 2015 [employer]. This  information is not intended to replace advice given to you by your health care provider. Make sure you discuss any questions you have with your health care provider. 

## 2015-05-18 NOTE — Progress Notes (Signed)
Subjective:  Patient ID: David Black, male    DOB: 1961/02/05  Age: 54 y.o. MRN: [id number]  CC: Ingrown Toenail   HPI XIONG HAIDAR presents for excision of ingrown toenail. It has become swollen and painful. It's interfering with walking. Nails involved is the right great nail. Symptoms increasing over several weeks. He has had removal in the remote past, as an adolescent.  History Kinta has a past medical history of GERD (gastroesophageal reflux disease); Obesity, unspecified; CAD (coronary artery disease); Depression with anxiety; Dyslipidemia; Hypertension; Smoker; SOB (shortness of breath) (07/18/2010); Chest discomfort (07/18/2010); Hyperlipidemia; and Diabetes mellitus without complication.   He has past surgical history that includes Ventral hernia repair (06/2010); Cholecystectomy; and Hernia repair.   His family history includes Breast cancer in his paternal aunt; Coronary artery disease in his other; Diabetes in his mother; Heart disease in his father and paternal uncle; Hyperlipidemia in his father and mother; Hypertension in his father and mother; Kidney disease in his father; Testicular cancer in his paternal uncle.He reports that he quit smoking about 12 years ago. He does not have any smokeless tobacco history on file. He reports that he does not drink alcohol or use illicit drugs.  Outpatient Prescriptions Prior to Visit  Medication Sig Dispense Refill  . aspirin 81 MG tablet Take 81 mg by mouth daily.      . canagliflozin (INVOKANA) 100 MG TABS tablet Take 1 tablet (100 mg total) by mouth daily. 30 tablet 5  . cyclobenzaprine (FLEXERIL) 10 MG tablet Take 1 tablet (10 mg total) by mouth 3 (three) times daily as needed for muscle spasms. 30 tablet 0  . fenofibrate 54 MG tablet Take 1 tablet (54 mg total) by mouth daily. 30 tablet 1  . gabapentin (NEURONTIN) 300 MG capsule TAKE (1) CAPSULE THREE TIMES DAILY. 90 capsule 11  . glimepiride (AMARYL) 4 MG tablet Take 1 tablet  (4 mg total) by mouth daily with breakfast. 30 tablet 11  . glucose blood test strip 1 each by Other route 2 (two) times daily. 100 each 5  . meloxicam (MOBIC) 15 MG tablet Take 1 tablet (15 mg total) by mouth daily. 30 tablet 4  . ONETOUCH DELICA LANCETS FINE MISC 1 each by Does not apply route 2 (two) times daily. 100 each 5  . pantoprazole (PROTONIX) 40 MG tablet TAKE 1 TABLET DAILY 30 tablet 5  . traMADol (ULTRAM) 50 MG tablet Take 1 tablet (50 mg total) by mouth every 8 (eight) hours as needed. 60 tablet 0  . valsartan-hydrochlorothiazide (DIOVAN-HCT) 320-25 MG per tablet TAKE 1 TABLET DAILY 30 tablet 5  . Vitamin D, Ergocalciferol, (DRISDOL) 50000 UNITS CAPS capsule Take 1 capsule (50,000 Units total) by mouth 2 (two) times a week. (Patient not taking: Reported on 05/18/2015) 16 capsule 0   No facility-administered medications prior to visit.    ROS Review of Systems  Constitutional: Negative for fever, chills and diaphoresis.  HENT: Negative for congestion, rhinorrhea and sore throat.   Respiratory: Negative for shortness of breath.   Cardiovascular: Negative for chest pain.  Gastrointestinal: Negative for abdominal pain.  Genitourinary: Negative for dysuria and frequency.  Musculoskeletal: Negative for joint swelling and arthralgias.  Skin: Negative for rash.  Neurological: Negative for headaches.    Objective:  BP 117/83 mmHg  Pulse 68  Temp(Src) 97.4 F (36.3 C) (Oral)  Ht 5\' 9"  (1.753 m)  Wt 261 lb (118.389 kg)  BMI 38.53 kg/m2  BP Readings from Last 3  Encounters:  05/18/15 117/83  03/14/15 121/79  02/15/15 109/70    Wt Readings from Last 3 Encounters:  05/18/15 261 lb (118.389 kg)  03/14/15 260 lb (117.935 kg)  02/15/15 263 lb 6.4 oz (119.477 kg)     Physical Exam  Constitutional: He appears well-developed and well-nourished.  HENT:  Head: Normocephalic and atraumatic.  Right Ear: External ear normal.  Left Ear: External ear normal.  Mouth/Throat: No  oropharyngeal exudate or posterior oropharyngeal erythema.  Eyes: Pupils are equal, round, and reactive to light.  Neck: Normal range of motion. Neck supple.  Cardiovascular: Normal rate and regular rhythm.   No murmur heard. Pulmonary/Chest: Breath sounds normal. No respiratory distress.  Abdominal: Bowel sounds are normal.  Skin:  The lateral aspect of the right great toenail is ingrown and the troponin I every morning is markedly edematous and erythematous. This is very tender to touch. The MTP joint is spared.  Vitals reviewed. Toenail Avulsion Procedure Note  Pre-operative Diagnosis: Right Ingrown Great toenail   Post-operative Diagnosis: Right Ingrown Great toenail  Indications: Infected swollen and painful toe  Anesthesia: Lidocaine 2% without epinephrine without added sodium bicarbonate  Procedure Details  History of allergy to iodine: no  The risks (including bleeding and infection) and benefits of the  procedure and Verbal informed consent obtained.  After digital block anesthesia was obtained, a tourniquet was applied for hemostasis during the procedure.  After prepping with Betadine, the offending edge of the nail was freed from the nailbed and perionychium, and then split with scissors and removed with  forceps.  All visible granulation tissue is debrided. Antibiotic and bulky dressing was applied.   Findings: Infected, inflamed toenail  Complications: none.  Plan: 1. Soak the foot twice daily. Change dressing twice daily until healed over. 2. Warning signs of infection were reviewed.   3. Recommended that the patient use Tylenol #3 as needed for pain.  4. Return in several days as needed.  Lab Results  Component Value Date   HGBA1C 5.8 02/15/2015   HGBA1C 6.1 08/17/2014   HGBA1C 6.0% 05/17/2014    Lab Results  Component Value Date   WBC 7.2 02/15/2015   HGB 15.5 02/15/2015   HCT 48.0 02/15/2015   PLT 201 09/12/2013   GLUCOSE 127* 02/15/2015   CHOL  172 02/15/2015   TRIG 212* 02/15/2015   HDL 25* 02/15/2015   LDLCALC 105* 02/15/2015   ALT 27 02/15/2015   AST 22 02/15/2015   NA 141 02/15/2015   K 4.4 02/15/2015   CL 100 02/15/2015   CREATININE 1.25 02/15/2015   BUN 19 02/15/2015   CO2 25 02/15/2015   TSH 1.470 05/17/2014   PSA 0.7 05/17/2014   INR 0.98 09/12/2013   HGBA1C 5.8 02/15/2015    No results found.  Assessment & Plan:   Erma was seen today for ingrown toenail.  Diagnoses and all orders for this visit:  Ingrown right big toenail  Other orders -     amoxicillin-clavulanate (AUGMENTIN) 875-125 MG tablet; Take 1 tablet by mouth 2 (two) times daily. Take all of this medication -     acetaminophen-codeine (TYLENOL #3) 300-30 MG tablet; Take 1-2 tablets by mouth every 4 (four) hours as needed for moderate pain.  I have discontinued Mr. Para Vitamin D (Ergocalciferol). I am also having him start on amoxicillin-clavulanate and acetaminophen-codeine. Additionally, I am having him maintain his aspirin, glimepiride, glucose blood, ONETOUCH DELICA LANCETS FINE, gabapentin, fenofibrate, canagliflozin, cyclobenzaprine, meloxicam, traMADol, pantoprazole, and valsartan-hydrochlorothiazide.  Meds ordered this encounter  Medications  . amoxicillin-clavulanate (AUGMENTIN) 875-125 MG tablet    Sig: Take 1 tablet by mouth 2 (two) times daily. Take all of this medication    Dispense:  10 tablet    Refill:  0  . acetaminophen-codeine (TYLENOL #3) 300-30 MG tablet    Sig: Take 1-2 tablets by mouth every 4 (four) hours as needed for moderate pain.    Dispense:  30 tablet    Refill:  0     Follow-up: No Follow-up on file.  Claretta Fraise, M.D.

## 2015-06-01 ENCOUNTER — Ambulatory Visit (INDEPENDENT_AMBULATORY_CARE_PROVIDER_SITE_OTHER): Payer: BLUE CROSS/BLUE SHIELD | Admitting: *Deleted

## 2015-06-01 DIAGNOSIS — Z23 Encounter for immunization: Secondary | ICD-10-CM

## 2015-06-13 ENCOUNTER — Other Ambulatory Visit: Payer: Self-pay | Admitting: Family Medicine

## 2015-06-29 ENCOUNTER — Other Ambulatory Visit: Payer: Self-pay | Admitting: Family Medicine

## 2015-07-26 ENCOUNTER — Encounter: Payer: Self-pay | Admitting: Family Medicine

## 2015-07-26 ENCOUNTER — Ambulatory Visit (INDEPENDENT_AMBULATORY_CARE_PROVIDER_SITE_OTHER): Payer: BLUE CROSS/BLUE SHIELD | Admitting: Family Medicine

## 2015-07-26 VITALS — BP 119/79 | HR 83 | Temp 98.6°F | Ht 69.0 in | Wt 271.4 lb

## 2015-07-26 DIAGNOSIS — Z0000 Encounter for general adult medical examination without abnormal findings: Secondary | ICD-10-CM

## 2015-07-26 DIAGNOSIS — K6289 Other specified diseases of anus and rectum: Secondary | ICD-10-CM

## 2015-07-26 DIAGNOSIS — K629 Disease of anus and rectum, unspecified: Secondary | ICD-10-CM

## 2015-07-26 DIAGNOSIS — E119 Type 2 diabetes mellitus without complications: Secondary | ICD-10-CM | POA: Diagnosis not present

## 2015-07-26 DIAGNOSIS — R159 Full incontinence of feces: Secondary | ICD-10-CM | POA: Diagnosis not present

## 2015-07-26 DIAGNOSIS — Z1212 Encounter for screening for malignant neoplasm of rectum: Secondary | ICD-10-CM

## 2015-07-26 DIAGNOSIS — R0902 Hypoxemia: Secondary | ICD-10-CM | POA: Diagnosis not present

## 2015-07-26 DIAGNOSIS — I10 Essential (primary) hypertension: Secondary | ICD-10-CM

## 2015-07-26 DIAGNOSIS — E785 Hyperlipidemia, unspecified: Secondary | ICD-10-CM | POA: Diagnosis not present

## 2015-07-26 DIAGNOSIS — Z139 Encounter for screening, unspecified: Secondary | ICD-10-CM

## 2015-07-26 DIAGNOSIS — K641 Second degree hemorrhoids: Secondary | ICD-10-CM

## 2015-07-26 DIAGNOSIS — E669 Obesity, unspecified: Secondary | ICD-10-CM | POA: Diagnosis not present

## 2015-07-26 DIAGNOSIS — E162 Hypoglycemia, unspecified: Secondary | ICD-10-CM

## 2015-07-26 DIAGNOSIS — G4734 Idiopathic sleep related nonobstructive alveolar hypoventilation: Secondary | ICD-10-CM

## 2015-07-26 LAB — POCT GLYCOSYLATED HEMOGLOBIN (HGB A1C): Hemoglobin A1C: 6.1

## 2015-07-26 MED ORDER — GLUCOSE BLOOD VI STRP: ORAL_STRIP | Status: DC

## 2015-07-26 MED ORDER — HYDROCORTISONE ACETATE 30 MG RE SUPP: 30.0000 mg | Freq: Four times a day (QID) | RECTAL | Status: DC | PRN

## 2015-07-26 NOTE — Patient Instructions (Signed)
Try the suppository to see if it shrinks the hemorrhoids enough to stop the fecal leakage.  Discontinue the glimepiride and I will let you know what I would like for you to do instead once we have your A1c result. Merry Christmas .

## 2015-07-26 NOTE — Progress Notes (Signed)
Subjective:  Patient ID: David Black, male    DOB: 05/26/1961  Age: 54 y.o. MRN: [id number]  CC: No chief complaint on file.   HPI David Black presents for physical examination. His main concern is that he had hemorrhoids in the summer. has had occasional leakage from rectum. Bowel movements move daily. They are formed normally. In between these he may have watery dripping at times. Denies constipation. Denies any rectal pain or bleeding.  Glucose 130s fasting most days.. Bottoming out in the day once in a while. No specific time of day. It will just cause him to get dizzy, run down when this occurs. Relief with taking some carbs quickly. No nausea or syncope associated .  Patient continues to use his BiPAP. Tries to use it all night every night. He's having some trouble getting approval for him to take it on a cruise with him over Christmastime. He needs a medical clearance letter today.   follow-up of hypertension. Patient has no history of headache chest pain or shortness of breath or recent cough. Patient also denies symptoms of TIA such as numbness weakness lateralizing. Patient checks  blood pressure at home and has not had any elevated readings recently. Patient denies side effects from his medication. States taking it regularly.   Patient in for follow-up of elevated cholesterol. Doing well without complaints on current medication. Denies side effects of statin including myalgia and arthralgia and nausea. Also in today for liver function testing. Currently no chest pain, shortness of breath or other cardiovascular related symptoms noted.  History David Black has a past medical history of GERD (gastroesophageal reflux disease); Obesity, unspecified; CAD (coronary artery disease); Depression with anxiety; Dyslipidemia; Hypertension; Smoker; SOB (shortness of breath) (07/18/2010); Chest discomfort (07/18/2010); Hyperlipidemia; and Diabetes mellitus without complication ([place]).   He has  past surgical history that includes Ventral hernia repair (06/2010); Cholecystectomy; and Hernia repair.   His family history includes Breast cancer in his paternal aunt; Coronary artery disease in his other; Diabetes in his mother; Heart disease in his father and paternal uncle; Hyperlipidemia in his father and mother; Hypertension in his father and mother; Kidney disease in his father; Testicular cancer in his paternal uncle.He reports that he quit smoking about 12 years ago. He does not have any smokeless tobacco history on file. He reports that he does not drink alcohol or use illicit drugs.  Outpatient Prescriptions Prior to Visit  Medication Sig Dispense Refill  . aspirin 81 MG tablet Take 81 mg by mouth daily.      . canagliflozin (INVOKANA) 100 MG TABS tablet Take 1 tablet (100 mg total) by mouth daily. 30 tablet 5  . cyclobenzaprine (FLEXERIL) 10 MG tablet Take 1 tablet (10 mg total) by mouth 3 (three) times daily as needed for muscle spasms. 30 tablet 0  . fenofibrate 54 MG tablet TAKE 1 TABLET ONCE DAILY 30 tablet 1  . gabapentin (NEURONTIN) 300 MG capsule TAKE (1) CAPSULE THREE TIMES DAILY. 90 capsule 11  . glimepiride (AMARYL) 4 MG tablet Take 1 tablet (4 mg total) by mouth daily with breakfast. 30 tablet 1  . meloxicam (MOBIC) 15 MG tablet Take 1 tablet (15 mg total) by mouth daily. 30 tablet 4  . pantoprazole (PROTONIX) 40 MG tablet TAKE 1 TABLET DAILY 30 tablet 5  . valsartan-hydrochlorothiazide (DIOVAN-HCT) 320-25 MG per tablet TAKE 1 TABLET DAILY 30 tablet 5  . acetaminophen-codeine (TYLENOL #3) 300-30 MG tablet Take 1-2 tablets by mouth every 4 (four)  hours as needed for moderate pain. (Patient not taking: Reported on 07/26/2015) 30 tablet 0  . traMADol (ULTRAM) 50 MG tablet Take 1 tablet (50 mg total) by mouth every 8 (eight) hours as needed. (Patient not taking: Reported on 07/26/2015) 60 tablet 0  . amoxicillin-clavulanate (AUGMENTIN) 875-125 MG tablet Take 1 tablet by mouth 2  (two) times daily. Take all of this medication (Patient not taking: Reported on 07/26/2015) 10 tablet 0  . glucose blood (ONETOUCH VERIO) test strip Test blood sugar bid. DX E11.9 (Patient not taking: Reported on 07/26/2015) 100 each 2  . ONETOUCH DELICA LANCETS 49P MISC Test blood sugar bid. DX E11.9 (Patient not taking: Reported on 07/26/2015) 100 each 2   No facility-administered medications prior to visit.    ROS Review of Systems  Constitutional: Negative for fever, chills, diaphoresis, activity change, appetite change, fatigue and unexpected weight change.  HENT: Negative for congestion, ear pain, hearing loss, postnasal drip, rhinorrhea, sore throat, tinnitus and trouble swallowing.   Eyes: Negative for photophobia, pain, discharge and redness.  Respiratory: Negative for apnea, cough, choking, chest tightness, shortness of breath, wheezing and stridor.   Cardiovascular: Negative for chest pain, palpitations and leg swelling.  Gastrointestinal: Negative for nausea, vomiting, abdominal pain, diarrhea, constipation, blood in stool and abdominal distention.  Endocrine: Negative for cold intolerance, heat intolerance, polydipsia, polyphagia and polyuria.  Genitourinary: Negative for dysuria, urgency, frequency, hematuria, flank pain, enuresis, difficulty urinating and genital sores.  Musculoskeletal: Negative for joint swelling and arthralgias.  Skin: Negative for color change, rash and wound.  Allergic/Immunologic: Negative for immunocompromised state.  Neurological: Negative for dizziness, tremors, seizures, syncope, facial asymmetry, speech difficulty, weakness, light-headedness, numbness and headaches.  Hematological: Does not bruise/bleed easily.  Psychiatric/Behavioral: Negative for suicidal ideas, hallucinations, behavioral problems, confusion, sleep disturbance, dysphoric mood, decreased concentration and agitation. The patient is not nervous/anxious and is not hyperactive.      Objective:  BP 119/79 mmHg  Pulse 83  Temp(Src) 98.6 F (37 C) (Oral)  Ht 5' 9"  (1.753 m)  Wt 271 lb 6.4 oz (123.106 kg)  BMI 40.06 kg/m2  SpO2 97%  BP Readings from Last 3 Encounters:  07/26/15 119/79  05/18/15 117/83  03/14/15 121/79    Wt Readings from Last 3 Encounters:  07/26/15 271 lb 6.4 oz (123.106 kg)  05/18/15 261 lb (118.389 kg)  03/14/15 260 lb (117.935 kg)     Physical Exam  Constitutional: He is oriented to person, place, and time. He appears well-developed and well-nourished.  HENT:  Head: Normocephalic and atraumatic.  Mouth/Throat: Oropharynx is clear and moist.  Eyes: EOM are normal. Pupils are equal, round, and reactive to light.  Neck: Normal range of motion. No tracheal deviation present. No thyromegaly present.  Cardiovascular: Normal rate, regular rhythm and normal heart sounds.  Exam reveals no gallop and no friction rub.   No murmur heard. Pulmonary/Chest: Breath sounds normal. He has no wheezes. He has no rales.  Abdominal: Soft. He exhibits no mass. There is no tenderness.  Genitourinary: Prostate normal and penis normal. No penile tenderness.  IFOB obtained. There are 2 small hemorrhoids at the anal verge. These could be preventing complete closure of the anal sphincter.  Musculoskeletal: Normal range of motion. He exhibits no edema.  Neurological: He is alert and oriented to person, place, and time. He has normal reflexes. No cranial nerve deficit. He exhibits normal muscle tone.  Skin: Skin is warm and dry.  Psychiatric: He has a normal mood and affect.  Lab Results  Component Value Date   WBC 7.2 02/15/2015   HGB 15.5 02/15/2015   HCT 48.0 02/15/2015   PLT 201 09/12/2013   GLUCOSE 127* 02/15/2015   CHOL 172 02/15/2015   TRIG 212* 02/15/2015   HDL 25* 02/15/2015   LDLCALC 105* 02/15/2015   ALT 27 02/15/2015   AST 22 02/15/2015   NA 141 02/15/2015   K 4.4 02/15/2015   CL 100 02/15/2015   CREATININE 1.25 02/15/2015    BUN 19 02/15/2015   CO2 25 02/15/2015   TSH 1.470 05/17/2014   PSA 0.7 05/17/2014   INR 0.98 09/12/2013   HGBA1C 6.1 07/26/2015      Assessment & Plan:   Diagnoses and all orders for this visit:  Routine general medical examination at a health care facility -     POCT glycosylated hemoglobin (Hb A1C) -     Microalbumin / creatinine urine ratio -     CMP14+EGFR -     CBC with Differential/Platelet -     Lipid panel -     PSA, total and free -     TSH -     VITAMIN D 25 Hydroxy (Vit-D Deficiency, Fractures)  Controlled type 2 diabetes mellitus without complication, without long-term current use of insulin (HCC) -     POCT glycosylated hemoglobin (Hb A1C) -     Microalbumin / creatinine urine ratio -     CMP14+EGFR  Hyperlipemia -     CMP14+EGFR -     Lipid panel  Essential hypertension -     CMP14+EGFR  Oxygen desaturation during sleep -     For home use only DME oxygen  Screening -     Ambulatory referral to Gastroenterology  Fecal incontinence due to anorectal disorder -     Ambulatory referral to Gastroenterology  Second degree hemorrhoids -     Ambulatory referral to Gastroenterology  Hypoglycemia  Screening for malignant neoplasm of the rectum -     Fecal occult blood, imunochemical  Obesity, unspecified  Other orders -     glucose blood test strip; Use as instructed -     HYDROCORTISONE ACE, RECTAL, (PROCTOCORT) 30 MG SUPP; Place 1 suppository (30 mg total) rectally 4 (four) times daily as needed. For hemorrhoid or leakage   I have discontinued Mr. Kurka amoxicillin-clavulanate, ONETOUCH DELICA LANCETS 01U, and glucose blood. I am also having him start on glucose blood and HYDROCORTISONE ACE (RECTAL). Additionally, I am having him maintain his aspirin, gabapentin, canagliflozin, cyclobenzaprine, meloxicam, traMADol, pantoprazole, valsartan-hydrochlorothiazide, acetaminophen-codeine, glimepiride, and fenofibrate.  Meds ordered this encounter   Medications  . glucose blood test strip    Sig: Use as instructed    Dispense:  100 each    Refill:  12    Contour meter  . HYDROCORTISONE ACE, RECTAL, (PROCTOCORT) 30 MG SUPP    Sig: Place 1 suppository (30 mg total) rectally 4 (four) times daily as needed. For hemorrhoid or leakage    Dispense:  28 each    Refill:  5     Follow-up: Return in about 3 months (around 10/24/2015).  Claretta Fraise, M.D.

## 2015-07-28 ENCOUNTER — Encounter: Payer: Self-pay | Admitting: Gastroenterology

## 2015-07-28 LAB — MICROALBUMIN / CREATININE URINE RATIO
CREATININE, UR: 33.3 mg/dL
MICROALB/CREAT RATIO: <9 mg/g creat (ref 0.0–30.0)

## 2015-07-28 LAB — PSA, TOTAL AND FREE
PROSTATE SPECIFIC AG, SERUM: 0.8 ng/mL (ref 0.0–4.0)
PSA FREE PCT: 33.8 %
PSA FREE: 0.27 ng/mL

## 2015-07-28 LAB — CBC WITH DIFFERENTIAL/PLATELET
BASOS: 1 %
Basophils Absolute: 0.1 10*3/uL (ref 0.0–0.2)
EOS (ABSOLUTE): 0.2 10*3/uL (ref 0.0–0.4)
EOS: 4 %
HEMATOCRIT: 47.8 % (ref 37.5–51.0)
Hemoglobin: 16.2 g/dL (ref 12.6–17.7)
Immature Grans (Abs): 0 10*3/uL (ref 0.0–0.1)
Immature Granulocytes: 1 %
LYMPHS ABS: 2 10*3/uL (ref 0.7–3.1)
Lymphs: 31 %
MCH: 31 pg (ref 26.6–33.0)
MCHC: 33.9 g/dL (ref 31.5–35.7)
MCV: 91 fL (ref 79–97)
MONOS ABS: 0.5 10*3/uL (ref 0.1–0.9)
Monocytes: 7 %
NEUTROS PCT: 56 %
Neutrophils Absolute: 3.7 10*3/uL (ref 1.4–7.0)
PLATELETS: 219 10*3/uL (ref 150–379)
RBC: 5.23 x10E6/uL (ref 4.14–5.80)
RDW: 14.2 % (ref 12.3–15.4)
WBC: 6.4 10*3/uL (ref 3.4–10.8)

## 2015-07-28 LAB — CMP14+EGFR
A/G RATIO: 1.7 (ref 1.1–2.5)
ALBUMIN: 4.5 g/dL (ref 3.5–5.5)
ALK PHOS: 39 IU/L (ref 39–117)
ALT: 24 IU/L (ref 0–44)
AST: 18 IU/L (ref 0–40)
BUN / CREAT RATIO: 16 (ref 9–20)
BUN: 18 mg/dL (ref 6–24)
Bilirubin Total: 0.5 mg/dL (ref 0.0–1.2)
CALCIUM: 9.7 mg/dL (ref 8.7–10.2)
CO2: 24 mmol/L (ref 18–29)
CREATININE: 1.15 mg/dL (ref 0.76–1.27)
Chloride: 102 mmol/L (ref 97–106)
GFR calc Af Amer: 84 mL/min/{1.73_m2} (ref >59)
GFR, EST NON AFRICAN AMERICAN: 73 mL/min/{1.73_m2} (ref >59)
GLOBULIN, TOTAL: 2.7 g/dL (ref 1.5–4.5)
Glucose: 116 mg/dL — ABNORMAL HIGH (ref 65–99)
POTASSIUM: 4.3 mmol/L (ref 3.5–5.2)
SODIUM: 141 mmol/L (ref 136–144)
Total Protein: 7.2 g/dL (ref 6.0–8.5)

## 2015-07-28 LAB — VITAMIN D 25 HYDROXY (VIT D DEFICIENCY, FRACTURES): VIT D 25 HYDROXY: 24.9 ng/mL — AB (ref 30.0–100.0)

## 2015-07-28 LAB — LIPID PANEL
CHOL/HDL RATIO: 5.8 ratio — AB (ref 0.0–5.0)
Cholesterol, Total: 162 mg/dL (ref 100–199)
HDL: 28 mg/dL — AB (ref >39)
LDL Calculated: 99 mg/dL (ref 0–99)
Triglycerides: 173 mg/dL — ABNORMAL HIGH (ref 0–149)
VLDL Cholesterol Cal: 35 mg/dL (ref 5–40)

## 2015-07-28 LAB — TSH: TSH: 2.3 u[IU]/mL (ref 0.450–4.500)

## 2015-07-29 LAB — FECAL OCCULT BLOOD, IMMUNOCHEMICAL: FECAL OCCULT BLD: NEGATIVE

## 2015-08-05 ENCOUNTER — Encounter: Payer: Self-pay | Admitting: *Deleted

## 2015-08-08 ENCOUNTER — Other Ambulatory Visit: Payer: Self-pay | Admitting: Family Medicine

## 2015-08-08 ENCOUNTER — Other Ambulatory Visit: Payer: Self-pay | Admitting: Family

## 2015-08-10 ENCOUNTER — Other Ambulatory Visit: Payer: Self-pay | Admitting: Family Medicine

## 2015-09-06 ENCOUNTER — Ambulatory Visit (AMBULATORY_SURGERY_CENTER): Payer: Self-pay

## 2015-09-06 ENCOUNTER — Telehealth: Payer: Self-pay

## 2015-09-06 VITALS — Ht 69.0 in

## 2015-09-06 DIAGNOSIS — Z1211 Encounter for screening for malignant neoplasm of colon: Secondary | ICD-10-CM

## 2015-09-06 NOTE — Telephone Encounter (Signed)
Dr Tilda Burrow pt is here today for his pre-visit prior to the colonoscopy with you on 09/20/15.He states he is on O2, 2 liter every night due to his O2 sats drop.He is only on oxygen at night!He does not have COPD, no OSA, no asthma.He has been on O2 for over a year.His sleep study was negative.Do you want a direct hospital or a office visit.Please advise

## 2015-09-06 NOTE — Telephone Encounter (Signed)
Office visit please to discuss screening options. Thanks

## 2015-09-06 NOTE — Telephone Encounter (Signed)
Pt was scheduled for an office visit with Dr Havery Moros on 09/19/15 at 9:30am( per Rollene Fare) to discuss screening options.The pre-visit was not completed today.The colon on 09/20/15 was canceled.The patient understood.

## 2015-09-06 NOTE — Progress Notes (Signed)
Pt came into the office today for his pre-visit prior to his colonoscopy with Dr Havery Moros on 09/20/15.Pt is on 02 at bedtime .Per Dr Havery Moros ,the pt was scheduled for a office visit on 09/19/15 at 9:30am to evaluate for screening options..The colonoscopy on 09/20/15 was cancelled. The pt understood.

## 2015-09-07 ENCOUNTER — Other Ambulatory Visit: Payer: Self-pay | Admitting: Family Medicine

## 2015-09-07 ENCOUNTER — Other Ambulatory Visit: Payer: Self-pay | Admitting: Family

## 2015-09-13 ENCOUNTER — Telehealth: Payer: Self-pay | Admitting: Family Medicine

## 2015-09-14 NOTE — Telephone Encounter (Signed)
Will you please review results and advise °

## 2015-09-15 MED ORDER — VITAMIN D (ERGOCALCIFEROL) 1.25 MG (50000 UNIT) PO CAPS: 50000.0000 [IU] | ORAL_CAPSULE | ORAL | Status: DC

## 2015-09-15 NOTE — Telephone Encounter (Signed)
Please let the patient know that his blood test were very good. HbA1c excellent at 6.1. The only concern was a mildly low Vitamin D. Use otc 2000 units daily. Please offer my apology for the oversight. Thanks, WS

## 2015-09-15 NOTE — Telephone Encounter (Signed)
I sent in a prescription for him. Take twice weekly for 2 months then resume the 2000 units daily

## 2015-09-15 NOTE — Telephone Encounter (Signed)
Patients wife aware of results and medication change.

## 2015-09-15 NOTE — Telephone Encounter (Signed)
Spoke to Traci [facility]) and advised of results. She states he is already taking 2000 units of Vit D OTC. Would you like to increase? Please advise.

## 2015-09-19 ENCOUNTER — Telehealth: Payer: Self-pay | Admitting: Gastroenterology

## 2015-09-19 ENCOUNTER — Encounter: Payer: Self-pay | Admitting: Gastroenterology

## 2015-09-19 ENCOUNTER — Ambulatory Visit (INDEPENDENT_AMBULATORY_CARE_PROVIDER_SITE_OTHER): Payer: BLUE CROSS/BLUE SHIELD | Admitting: Gastroenterology

## 2015-09-19 VITALS — BP 112/60 | HR 76 | Ht 68.0 in | Wt 271.2 lb

## 2015-09-19 DIAGNOSIS — Z1211 Encounter for screening for malignant neoplasm of colon: Secondary | ICD-10-CM

## 2015-09-19 DIAGNOSIS — K649 Unspecified hemorrhoids: Secondary | ICD-10-CM

## 2015-09-19 DIAGNOSIS — G4734 Idiopathic sleep related nonobstructive alveolar hypoventilation: Secondary | ICD-10-CM

## 2015-09-19 NOTE — Telephone Encounter (Signed)
The patient said his hemorrhoids wer mild symptoms intermittent and didn't seem adament during the clinic visit to treat it with banding, we had discussed fiber supplement That being said, yes I can offer him a banding prior to the colonoscopy if this is bothering him or if he wishes to have it done. I have an opening on Feb 14th for the banding if he wishes to have it done on that day please schedule him. thanks

## 2015-09-19 NOTE — Telephone Encounter (Signed)
Spoke with patient's wife and gave her MD recommendations. She states her husband has hemorrhoids that are painful and she wants to know if he can have them banded prior to colonoscopy. Explained that he may need the colonoscopy to be sure that it is hemorrhoid problems and nothing else. She states she knows it is hemorrhoids because his PCM has been treating them. She wants to know if it is possible to do this prior to colonoscopy. She also wants to know if MD examined her husband today. Please, advise.

## 2015-09-19 NOTE — Progress Notes (Signed)
HPI :  55 y/o Black here for CRC screening:  He reports a history of what he thinks are Internal hemorrhoids. He reports he has intermittent symptoms that bother him periodically from his hemorrhoids but not routinely. He can rarely have bleeding in which he sees a small amount of red blood per rectum and some rare discomfort. No problems with constipation or diarrhea. No abdominal pains. No unintentional weight loss. No vomiting, eating well. No colon cancer in the family. He generally feels well without complaints at this time.   He reports he is on supplemental oxygen at night when he sleeps however he reports he does not have sleep apnea and does not use CPAP. Chart per [facility] shows he is using BiPAP. He does not have COPD, but there are reports of asthma in his chart. He has had negative FOBT testing last month. He has no anemia. He denies shortness of breath or chest pains.   Colonoscopy 4/ 2006 - no polyps  Past Medical History  Diagnosis Date  . GERD (gastroesophageal reflux disease)   . Obesity, unspecified   . CAD (coronary artery disease)     Minimal plaque on catheterization Nov 2011; EF normal by history  . Depression with anxiety   . Dyslipidemia     x years  . Hypertension     x 10 years  . Smoker     x 20 years, 1 1/2 ppd, quit 7 years ago  . SOB (shortness of breath) 07/18/2010  . Chest discomfort 07/18/2010    With exertion  . Hyperlipidemia   . Diabetes mellitus without complication [facility])      Past Surgical History  Procedure Laterality Date  . Ventral hernia repair  06/2010    x3  . Cholecystectomy    . Hernia repair    . Finger surgery      right hand,ring finger   Family History  Problem Relation Age of Onset  . Heart disease Father   . Hyperlipidemia Father   . Hypertension Father   . Kidney disease Father   . Breast cancer Paternal Aunt   . Testicular cancer Paternal Uncle   . Heart disease Paternal Uncle   . Coronary artery disease Other   .  Diabetes Mother   . Hyperlipidemia Mother   . Hypertension Mother    Social History  Substance Use Topics  . Smoking status: Former Smoker -- 1.David packs/day for 20 years    Types: Cigarettes    Quit date: 08/20/2002  . Smokeless tobacco: Never Used  . Alcohol Use: No   Current Outpatient Prescriptions  Medication Sig Dispense Refill  . aspirin 81 MG tablet Take 81 mg by mouth daily.      . fenofibrate 54 MG tablet TAKE 1 TABLET ONCE DAILY 30 tablet 5  . gabapentin (NEURONTIN) 300 MG capsule TAKE (1) CAPSULE THREE TIMES DAILY. 90 capsule 2  . glimepiride (AMARYL) 4 MG tablet TAKE 1 TABLET ONCE DAILY WITH BREAKFAST 30 tablet 5  . glucose blood test strip Use as instructed 100 each 12  . INVOKANA 100 MG TABS tablet TAKE 1 TABLET DAILY 30 tablet 5  . meloxicam (MOBIC) 15 MG tablet Take 1 tablet (15 mg total) by mouth daily. 30 tablet 2  . OXYGEN Inhale 2 L/min into the lungs Nightly.    . pantoprazole (PROTONIX) 40 MG tablet TAKE 1 TABLET DAILY 30 tablet 5  . valsartan-hydrochlorothiazide (DIOVAN-HCT) 320-25 MG tablet TAKE 1 TABLET DAILY 30 tablet  4  . Vitamin D, Ergocalciferol, (DRISDOL) 50000 units CAPS capsule Take 1 capsule (David,000 Units total) by mouth 2 (two) times a week. 16 capsule 0  . acetaminophen-codeine (TYLENOL #3) 300-30 MG tablet Take 1-2 tablets by mouth every 4 (four) hours as needed for moderate pain. (Patient not taking: Reported on 07/26/2015) 30 tablet 0  . cyclobenzaprine (FLEXERIL) 10 MG tablet Take 1 tablet (10 mg total) by mouth 3 (three) times daily as needed for muscle spasms. (Patient not taking: Reported on 09/06/2015) 30 tablet 0  . HYDROCORTISONE ACE, RECTAL, (PROCTOCORT) 30 MG SUPP Place 1 suppository (30 mg total) rectally 4 (four) times daily as needed. For hemorrhoid or leakage (Patient not taking: Reported on 09/06/2015) 28 each 5  . traMADol (ULTRAM) David MG tablet Take 1 tablet (David mg total) by mouth every 8 (eight) hours as needed. (Patient not taking:  Reported on 07/26/2015) 60 tablet 0   No current facility-administered medications for this visit.   Allergies  Allergen Reactions  . Advicor [Niacin-Lovastatin Er]   . Altace [Ramipril]     cough  . Bextra [Valdecoxib]   . Bupropion Hcl   . Janumet [Sitagliptin-Metformin Hcl]   . Niacin   . Rofecoxib   . Sulfamethoxazole-Trimethoprim   . Sulfonamide Derivatives      Review of Systems: All systems reviewed and negative except where noted in HPI.   Lab Results  Component Value Date   OCCULTBLD Negative 07/26/2015    Lab Results  Component Value Date   WBC 6.4 07/26/2015   HGB 15.5 02/15/2015   HCT 47.8 07/26/2015   MCV 91 07/26/2015   PLT 219 07/26/2015   Lab Results  Component Value Date   CREATININE 1.15 07/26/2015   BUN 18 07/26/2015   NA 141 07/26/2015   K 4.3 07/26/2015   CL 102 07/26/2015   CO2 24 07/26/2015    Lab Results  Component Value Date   ALT 24 07/26/2015   AST 18 07/26/2015   ALKPHOS 39 07/26/2015   BILITOT 0.5 07/26/2015     Physical Exam: BP 112/60 mmHg  Pulse 76  Ht 5\' 8"  (1.727 m)  Wt 271 lb 4 oz (123.038 kg)  BMI 41.25 kg/m2 Constitutional: Pleasant,well-developed, Black in no acute distress. HEENT: Normocephalic and atraumatic. Conjunctivae are normal. No scleral icterus. Neck supple.  Cardiovascular: Normal rate, regular rhythm.  Pulmonary/chest: Effort normal and breath sounds normal. No wheezing, rales or rhonchi. Abdominal: Soft, protuberant, nontender. Bowel sounds active throughout. There are no masses palpable. No hepatomegaly. Extremities: no edema Lymphadenopathy: No cervical adenopathy noted. Neurological: Alert and oriented to person place and time. Skin: Skin is warm and dry. No rashes noted. Psychiatric: Normal mood and affect. Behavior is normal.   ASSESSMENT AND PLAN: 55 y/o Black with intermittently symptomatic hemorrhoids, who is referred for CRC screening. He had a negative FOBT last year and has no anemia  otherwise.   CRC screening - discussed options including optical colonoscopy, stool based testing, etc. He has had negative screening with FOBT fairly recently. He does have symptoms that are consistent with hemorrhoidal bleeding but should be evaluated for this. I discussed the risks of sedation and colonoscopy with him at length. The indications, risks, and benefits of colonoscopy were explained to the patient in detail. Risks include but are not limited to bleeding, perforation, adverse reaction to medications, and cardiopulmonary compromise. Given his baseline oxygen requirement he is higher risk for oxygen desaturation during the procedure, and his case would need  to be done at [facility]. It is not clear to me why he is on oxygen at home if he reports he does not have OSA or underlying lung disease, and he may wish to follow up with pulmonary as he has not seen them since 2012. Following a discussion of this, he wishes to have an optical colonoscopy done and declined further stool testing for screening. Further recommendations pending the results  Hemorrhoids - intermittent symptoms. Recommend daily fiber supplement but also discussed hemorrhoid banding. Handout given regarding [place] banding system. He will await his colonoscopy and if symptoms persist and no other pathology noted on colonoscopy to cause his symptoms, he wishes to proceed, and will coordinate a banding procedure.    [place] Cellar, MD [facility] Pager [phone]  CC: Claretta Fraise, MD

## 2015-09-19 NOTE — Telephone Encounter (Signed)
Spoke with patient's wife and scheduled for banding on 10/04/15 at 3;15 PM.

## 2015-09-19 NOTE — Telephone Encounter (Signed)
David Black can you please let him know I will accomodate him as soon as I can. Given the procedure has to be done at the hospital, we have limited dates but let's look at the schedule and see what we can come up with. Thanks

## 2015-09-19 NOTE — Patient Instructions (Signed)
We will contact you about scheduling a colonoscopy in April at [facility].

## 2015-09-20 ENCOUNTER — Encounter: Payer: PRIVATE HEALTH INSURANCE | Admitting: Gastroenterology

## 2015-09-26 ENCOUNTER — Telehealth: Payer: Self-pay

## 2015-09-26 ENCOUNTER — Other Ambulatory Visit: Payer: Self-pay

## 2015-09-26 DIAGNOSIS — Z1211 Encounter for screening for malignant neoplasm of colon: Secondary | ICD-10-CM

## 2015-09-26 NOTE — Telephone Encounter (Signed)
Scheduled pt at [facility] hospital on 11/08/2015 at 8:30am. I also scheduled pt for a previsit appointment on 10/04/2015 the same day he has a Hem banding scheduled. I called pt and informed him that he must come to the previsit appointment to get instructions for the procedure and sign the necessary paperwork. He understands.

## 2015-09-27 NOTE — Telephone Encounter (Signed)
Noted, thanks for coordinating

## 2015-10-04 ENCOUNTER — Encounter: Payer: BLUE CROSS/BLUE SHIELD | Admitting: Gastroenterology

## 2015-10-21 ENCOUNTER — Telehealth: Payer: Self-pay | Admitting: Gastroenterology

## 2015-10-21 NOTE — Telephone Encounter (Signed)
Spoke with patient's wife and he saw [facility] and was told he has a fistula. The surgeon does not want him to have a colonoscopy until it is healed. Needs to cancel March 21 hospital colonoscopy. Will try again in July to have colonoscopy. Cancelled procedure at [facility] endo.

## 2015-10-21 NOTE — Telephone Encounter (Signed)
That's okay. Please put him on a recall and we should add him to the schedule for July when it's released. Thanks

## 2015-10-24 ENCOUNTER — Ambulatory Visit: Payer: BLUE CROSS/BLUE SHIELD | Admitting: Family Medicine

## 2015-10-26 NOTE — Telephone Encounter (Signed)
Reminder put @ Epic.

## 2015-11-02 ENCOUNTER — Ambulatory Visit (INDEPENDENT_AMBULATORY_CARE_PROVIDER_SITE_OTHER): Payer: BLUE CROSS/BLUE SHIELD | Admitting: Family Medicine

## 2015-11-02 ENCOUNTER — Encounter: Payer: Self-pay | Admitting: Family Medicine

## 2015-11-02 VITALS — BP 124/68 | HR 75 | Temp 98.9°F | Ht 68.0 in | Wt 271.1 lb

## 2015-11-02 DIAGNOSIS — Z791 Long term (current) use of non-steroidal anti-inflammatories (NSAID): Secondary | ICD-10-CM | POA: Diagnosis not present

## 2015-11-02 DIAGNOSIS — I10 Essential (primary) hypertension: Secondary | ICD-10-CM | POA: Diagnosis not present

## 2015-11-02 DIAGNOSIS — E785 Hyperlipidemia, unspecified: Secondary | ICD-10-CM

## 2015-11-02 DIAGNOSIS — E119 Type 2 diabetes mellitus without complications: Secondary | ICD-10-CM

## 2015-11-02 DIAGNOSIS — E669 Obesity, unspecified: Secondary | ICD-10-CM

## 2015-11-02 LAB — BAYER DCA HB A1C WAIVED: HB A1C: 6.5 % (ref <7.0)

## 2015-11-02 NOTE — Progress Notes (Signed)
Subjective:  Patient ID: David Black, male    DOB: 1960/09/23  Age: 55 y.o. MRN: [id number]  CC: Diabetes   HPI STEFEN JUBA presents forFollow-up of diabetes. Patient checks blood sugar at home.   120- 160 fasting and  Not checking postprandial. Occasionally up to low 200s. Patient denies symptoms such as polyuria, polydipsia, excessive hunger, nausea No significant hypoglycemic spells noted. Medications as noted below. Taking them regularly without complication/adverse reaction being reported today. Checking feet daily. Last eye appt was remote  History Derran has a past medical history of GERD (gastroesophageal reflux disease); Obesity, unspecified; CAD (coronary artery disease); Depression with anxiety; Dyslipidemia; Hypertension; Smoker; SOB (shortness of breath) (07/18/2010); Chest discomfort (07/18/2010); Hyperlipidemia; and Diabetes mellitus without complication ([place]).   He has past surgical history that includes Ventral hernia repair (06/2010); Cholecystectomy; Hernia repair; and Finger surgery.   His family history includes Breast cancer in his paternal aunt; Coronary artery disease in his other; Diabetes in his mother; Heart disease in his father and paternal uncle; Hyperlipidemia in his father and mother; Hypertension in his father and mother; Kidney disease in his father; Testicular cancer in his paternal uncle.He reports that he quit smoking about 13 years ago. His smoking use included Cigarettes. He has a 30 pack-year smoking history. He has never used smokeless tobacco. He reports that he does not drink alcohol or use illicit drugs.  Current Outpatient Prescriptions on File Prior to Visit  Medication Sig Dispense Refill  . acetaminophen-codeine (TYLENOL #3) 300-30 MG tablet Take 1-2 tablets by mouth every 4 (four) hours as needed for moderate pain. 30 tablet 0  . aspirin 81 MG tablet Take 81 mg by mouth daily.      . cyclobenzaprine (FLEXERIL) 10 MG tablet Take 1  tablet (10 mg total) by mouth 3 (three) times daily as needed for muscle spasms. 30 tablet 0  . fenofibrate 54 MG tablet TAKE 1 TABLET ONCE DAILY 30 tablet 5  . gabapentin (NEURONTIN) 300 MG capsule TAKE (1) CAPSULE THREE TIMES DAILY. 90 capsule 2  . glimepiride (AMARYL) 4 MG tablet TAKE 1 TABLET ONCE DAILY WITH BREAKFAST 30 tablet 5  . glucose blood test strip Use as instructed 100 each 12  . INVOKANA 100 MG TABS tablet TAKE 1 TABLET DAILY 30 tablet 5  . meloxicam (MOBIC) 15 MG tablet Take 1 tablet (15 mg total) by mouth daily. 30 tablet 2  . OXYGEN Inhale 2 L/min into the lungs Nightly.    . pantoprazole (PROTONIX) 40 MG tablet TAKE 1 TABLET DAILY 30 tablet 5  . traMADol (ULTRAM) 50 MG tablet Take 1 tablet (50 mg total) by mouth every 8 (eight) hours as needed. 60 tablet 0  . valsartan-hydrochlorothiazide (DIOVAN-HCT) 320-25 MG tablet TAKE 1 TABLET DAILY 30 tablet 4  . Vitamin D, Ergocalciferol, (DRISDOL) 50000 units CAPS capsule Take 1 capsule (50,000 Units total) by mouth 2 (two) times a week. 16 capsule 0  . HYDROCORTISONE ACE, RECTAL, (PROCTOCORT) 30 MG SUPP Place 1 suppository (30 mg total) rectally 4 (four) times daily as needed. For hemorrhoid or leakage (Patient not taking: Reported on 11/02/2015) 28 each 5   No current facility-administered medications on file prior to visit.    ROS Review of Systems  Constitutional: Negative for fever, chills, diaphoresis and unexpected weight change.  HENT: Negative for congestion, hearing loss, rhinorrhea and sore throat.   Eyes: Negative for visual disturbance.  Respiratory: Negative for cough and shortness of breath.   Cardiovascular:  Negative for chest pain.  Gastrointestinal: Negative for abdominal pain, diarrhea and constipation.  Genitourinary: Negative for dysuria and flank pain.  Musculoskeletal: Negative for joint swelling and arthralgias.  Skin: Negative for rash.  Neurological: Negative for dizziness and headaches.    Psychiatric/Behavioral: Negative for sleep disturbance and dysphoric mood.    Objective:  BP 124/68 mmHg  Pulse 75  Temp(Src) 98.9 F (37.2 C) (Oral)  Ht '5\' 8"'$  (1.727 m)  Wt 271 lb 2 oz (122.981 kg)  BMI 41.23 kg/m2  BP Readings from Last 3 Encounters:  11/02/15 124/68  09/19/15 112/60  07/26/15 119/79    Wt Readings from Last 3 Encounters:  11/02/15 271 lb 2 oz (122.981 kg)  09/19/15 271 lb 4 oz (123.038 kg)  07/26/15 271 lb 6.4 oz (123.106 kg)     Physical Exam  Constitutional: He is oriented to person, place, and time. He appears well-developed and well-nourished. No distress.  HENT:  Head: Normocephalic and atraumatic.  Right Ear: External ear normal.  Left Ear: External ear normal.  Nose: Nose normal.  Mouth/Throat: Oropharynx is clear and moist.  Eyes: Conjunctivae and EOM are normal. Pupils are equal, round, and reactive to light.  Neck: Normal range of motion. Neck supple. No thyromegaly present.  Cardiovascular: Normal rate, regular rhythm and normal heart sounds.   No murmur heard. Pulmonary/Chest: Effort normal and breath sounds normal. No respiratory distress. He has no wheezes. He has no rales.  Abdominal: Soft. Bowel sounds are normal. He exhibits no distension. There is no tenderness.  Lymphadenopathy:    He has no cervical adenopathy.  Neurological: He is alert and oriented to person, place, and time. He has normal reflexes.  Skin: Skin is warm and dry.  Psychiatric: He has a normal mood and affect. His behavior is normal. Judgment and thought content normal.    Lab Results  Component Value Date   HGBA1C 6.1 07/26/2015   HGBA1C 5.8 02/15/2015   HGBA1C 6.1 08/17/2014    Lab Results  Component Value Date   WBC 6.4 07/26/2015   HGB 15.5 02/15/2015   HCT 47.8 07/26/2015   PLT 219 07/26/2015   GLUCOSE 116* 07/26/2015   CHOL 162 07/26/2015   TRIG 173* 07/26/2015   HDL 28* 07/26/2015   LDLCALC 99 07/26/2015   ALT 24 07/26/2015   AST 18  07/26/2015   NA 141 07/26/2015   K 4.3 07/26/2015   CL 102 07/26/2015   CREATININE 1.15 07/26/2015   BUN 18 07/26/2015   CO2 24 07/26/2015   TSH 2.300 07/26/2015   PSA 0.7 05/17/2014   INR 0.98 09/12/2013   HGBA1C 6.1 07/26/2015     Assessment & Plan:   Rogers was seen today for diabetes.  Diagnoses and all orders for this visit:  Diabetes mellitus without complication ([place]) -     CMP14+EGFR -     Microalbumin / creatinine urine ratio -     Bayer DCA Hb A1c Waived -     Lipid panel -     Ambulatory referral to Ophthalmology  Hyperlipemia -     CMP14+EGFR -     Lipid panel  Obesity, unspecified  Essential hypertension -     CMP14+EGFR  NSAID long-term use -     CMP14+EGFR    Requests 6 mos follow up - A1c pending. If <6.5 okay to delay follow up  I am having Mr. Matranga maintain his aspirin, cyclobenzaprine, traMADol, acetaminophen-codeine, glucose blood, HYDROCORTISONE ACE (RECTAL), INVOKANA, meloxicam, glimepiride,  fenofibrate, gabapentin, pantoprazole, valsartan-hydrochlorothiazide, Vitamin D (Ergocalciferol), and OXYGEN.  No orders of the defined types were placed in this encounter.     Follow-up: Return in about 3 months (around 02/02/2016).  Claretta Fraise, M.D.

## 2015-11-02 NOTE — Patient Instructions (Signed)

## 2015-11-03 LAB — LIPID PANEL
CHOL/HDL RATIO: 5.8 ratio — AB (ref 0.0–5.0)
CHOLESTEROL TOTAL: 163 mg/dL (ref 100–199)
HDL: 28 mg/dL — AB (ref >39)
LDL CALC: 102 mg/dL — AB (ref 0–99)
TRIGLYCERIDES: 166 mg/dL — AB (ref 0–149)
VLDL Cholesterol Cal: 33 mg/dL (ref 5–40)

## 2015-11-03 LAB — CMP14+EGFR
ALBUMIN: 4.7 g/dL (ref 3.5–5.5)
ALT: 29 IU/L (ref 0–44)
AST: 22 IU/L (ref 0–40)
Albumin/Globulin Ratio: 1.7 (ref 1.2–2.2)
Alkaline Phosphatase: 45 IU/L (ref 39–117)
BUN / CREAT RATIO: 12 (ref 9–20)
BUN: 14 mg/dL (ref 6–24)
Bilirubin Total: 0.5 mg/dL (ref 0.0–1.2)
CALCIUM: 9.8 mg/dL (ref 8.7–10.2)
CO2: 26 mmol/L (ref 18–29)
CREATININE: 1.18 mg/dL (ref 0.76–1.27)
Chloride: 101 mmol/L (ref 96–106)
GFR calc Af Amer: 82 mL/min/{1.73_m2} (ref >59)
GFR, EST NON AFRICAN AMERICAN: 71 mL/min/{1.73_m2} (ref >59)
GLOBULIN, TOTAL: 2.8 g/dL (ref 1.5–4.5)
Glucose: 106 mg/dL — ABNORMAL HIGH (ref 65–99)
Potassium: 4.3 mmol/L (ref 3.5–5.2)
SODIUM: 142 mmol/L (ref 134–144)
TOTAL PROTEIN: 7.5 g/dL (ref 6.0–8.5)

## 2015-11-03 LAB — MICROALBUMIN / CREATININE URINE RATIO
Creatinine, Urine: 51.7 mg/dL
MICROALB/CREAT RATIO: <5.8 mg/g creat (ref 0.0–30.0)
Microalbumin, Urine: <3 ug/mL

## 2015-11-04 ENCOUNTER — Telehealth: Payer: Self-pay | Admitting: Family Medicine

## 2015-11-04 ENCOUNTER — Encounter (INDEPENDENT_AMBULATORY_CARE_PROVIDER_SITE_OTHER): Payer: Self-pay

## 2015-11-04 NOTE — Telephone Encounter (Signed)
Patient is unclear if he is supposed to make any changes. Patient states that you had discussed increasing invokana and stopping glimepiride. Please advise

## 2015-11-04 NOTE — Telephone Encounter (Signed)
Since his A1c was so good at 6.5 by decided not to make the change. However. He would still like to make the change it is a reasonable option. If he switches to the higher dose of Invokana instead of Invokana plus glipizide he reduces his risk of low blood sugar. He also has the convenience of 1 medication instead of 2 which means one less co-pay. On the other hand since his current regimen is producing an acceptable A1c it is not essential to make the change. In short this is one of those cases where we can do that he would like either way I will be happy to help.

## 2015-11-04 NOTE — Telephone Encounter (Signed)
Patient aware.

## 2015-11-07 ENCOUNTER — Other Ambulatory Visit: Payer: Self-pay | Admitting: Family Medicine

## 2015-11-07 NOTE — Telephone Encounter (Signed)
Last seen 11/02/15 Dr Livia Snellen   Vit D 07/26/15  24.9

## 2015-11-08 ENCOUNTER — Encounter (HOSPITAL_COMMUNITY): Admission: RE | Payer: Self-pay | Source: Ambulatory Visit

## 2015-11-08 ENCOUNTER — Ambulatory Visit (HOSPITAL_COMMUNITY)
Admission: RE | Admit: 2015-11-08 | Payer: BLUE CROSS/BLUE SHIELD | Source: Ambulatory Visit | Admitting: Gastroenterology

## 2015-11-08 SURGERY — COLONOSCOPY WITH PROPOFOL
Anesthesia: Monitor Anesthesia Care

## 2015-11-21 ENCOUNTER — Other Ambulatory Visit: Payer: Self-pay | Admitting: *Deleted

## 2015-11-21 MED ORDER — CANAGLIFLOZIN 300 MG PO TABS: 300.0000 mg | ORAL_TABLET | Freq: Every day | ORAL | Status: DC

## 2015-12-05 ENCOUNTER — Other Ambulatory Visit: Payer: Self-pay | Admitting: Family Medicine

## 2015-12-21 ENCOUNTER — Telehealth: Payer: Self-pay | Admitting: *Deleted

## 2015-12-21 NOTE — Telephone Encounter (Signed)
Spoke with patient's wife and patient has not healed yet. She states it looks like he is going to have surgery. She will call when he is healed enough to have the colonoscopy.

## 2015-12-28 ENCOUNTER — Encounter: Payer: Self-pay | Admitting: Gastroenterology

## 2016-01-06 ENCOUNTER — Other Ambulatory Visit: Payer: Self-pay | Admitting: Family Medicine

## 2016-01-06 NOTE — Telephone Encounter (Signed)
Last seen 11/02/15  Dr Livia Snellen  Last Vit D 07/26/15   24.9

## 2016-02-01 ENCOUNTER — Other Ambulatory Visit: Payer: Self-pay | Admitting: Family Medicine

## 2016-02-23 ENCOUNTER — Other Ambulatory Visit: Payer: Self-pay | Admitting: Family Medicine

## 2016-03-09 ENCOUNTER — Encounter (HOSPITAL_COMMUNITY): Payer: Self-pay

## 2016-03-09 ENCOUNTER — Ambulatory Visit (HOSPITAL_COMMUNITY): Admit: 2016-03-09 | Payer: Self-pay | Admitting: Gastroenterology

## 2016-03-09 SURGERY — COLONOSCOPY WITH PROPOFOL
Anesthesia: Monitor Anesthesia Care

## 2016-03-26 ENCOUNTER — Other Ambulatory Visit: Payer: Self-pay | Admitting: Family Medicine

## 2016-04-03 ENCOUNTER — Ambulatory Visit (INDEPENDENT_AMBULATORY_CARE_PROVIDER_SITE_OTHER): Payer: BLUE CROSS/BLUE SHIELD | Admitting: Family Medicine

## 2016-04-03 ENCOUNTER — Encounter: Payer: Self-pay | Admitting: Family Medicine

## 2016-04-03 VITALS — BP 109/66 | HR 73 | Temp 97.6°F | Ht 68.0 in | Wt 256.0 lb

## 2016-04-03 DIAGNOSIS — M545 Low back pain, unspecified: Secondary | ICD-10-CM

## 2016-04-03 MED ORDER — MELOXICAM 15 MG PO TABS: 15.0000 mg | ORAL_TABLET | Freq: Every day | ORAL | 0 refills | Status: DC

## 2016-04-03 MED ORDER — CYCLOBENZAPRINE HCL 10 MG PO TABS: 10.0000 mg | ORAL_TABLET | Freq: Three times a day (TID) | ORAL | 0 refills | Status: DC | PRN

## 2016-04-03 NOTE — Patient Instructions (Signed)
Great to meet you!  I prescribed meloxicam and Flexeril. Flexeril as a muscle relaxer. Flexeril may make you sleepy, I do not recommend driving after taking it.  Meloxicam is a strong NSAID, related to ibuprofen and Aleve. You may combine this with Tylenol but no other over-the-counter pain medications.

## 2016-04-03 NOTE — Progress Notes (Signed)
   HPI  Patient presents today here with right-sided low back pain.  Patient explains over the last 4 hours he's had slightly worsening right-sided low back pain. He is helped by Flexeril, however he's run out. He is also taking Tylenol. He denies any radiation down the leg or leg weakness or numbness 13 1. He also denies bowel or bladder dysfunction. He denies fever, chills, sweats.  This is similar to previous factors that he's had.  He could not attend work yesterday or today due to the pain.  He requests refill of cyclosporine.  PMH: Smoking status noted ROS: Per HPI  Objective: BP (!) 146/80   Pulse 73   Temp 97.6 F (36.4 C) (Oral)   Ht 5\' 8"  (1.727 m)   Wt 256 lb (116.1 kg)   BMI 38.92 kg/m  Gen: NAD, alert, cooperative with exam HEENT: NCAT CV: RRR, good S1/S2, no murmur Resp: CTABL, no wheezes, non-labored Ext: No edema, warm Neuro: Alert and oriented, strength 5/5 and sensation intact in bilateral lower extremities Normal gait  Assessment and plan:  # Right-sided low back pain without sciatica, some left-sided symptoms but right greater than left. Current flare Flexeril, meloxicam, +/- Tylenol. Return to clinic with any worsening or failure to improve   Meds ordered this encounter  Medications  . cyclobenzaprine (FLEXERIL) 10 MG tablet    Sig: Take 1 tablet (10 mg total) by mouth 3 (three) times daily as needed for muscle spasms.    Dispense:  30 tablet    Refill:  0  . meloxicam (MOBIC) 15 MG tablet    Sig: Take 1 tablet (15 mg total) by mouth daily.    Dispense:  14 tablet    Refill:  0    Laroy Apple, MD [facility] 04/03/2016, 10:14 AM

## 2016-05-02 ENCOUNTER — Other Ambulatory Visit: Payer: Self-pay | Admitting: Family Medicine

## 2016-05-02 NOTE — Telephone Encounter (Signed)
Authorize 30 days only. Then contact the patient letting them know that they will need an appointment before any further prescriptions can be sent in. 

## 2016-05-02 NOTE — Telephone Encounter (Signed)
LMOVM pt NTBS before next refill

## 2016-05-08 ENCOUNTER — Ambulatory Visit (INDEPENDENT_AMBULATORY_CARE_PROVIDER_SITE_OTHER): Payer: BLUE CROSS/BLUE SHIELD | Admitting: Family Medicine

## 2016-05-08 ENCOUNTER — Encounter: Payer: Self-pay | Admitting: Family Medicine

## 2016-05-08 VITALS — BP 130/82 | HR 84 | Temp 98.0°F | Ht 68.0 in | Wt 256.2 lb

## 2016-05-08 DIAGNOSIS — I10 Essential (primary) hypertension: Secondary | ICD-10-CM

## 2016-05-08 DIAGNOSIS — E669 Obesity, unspecified: Secondary | ICD-10-CM

## 2016-05-08 DIAGNOSIS — E119 Type 2 diabetes mellitus without complications: Secondary | ICD-10-CM

## 2016-05-08 DIAGNOSIS — E785 Hyperlipidemia, unspecified: Secondary | ICD-10-CM | POA: Diagnosis not present

## 2016-05-08 LAB — URINALYSIS
BILIRUBIN UA: NEGATIVE
Ketones, UA: NEGATIVE
Leukocytes, UA: NEGATIVE
NITRITE UA: NEGATIVE
PH UA: 6 (ref 5.0–7.5)
Protein, UA: NEGATIVE
RBC UA: NEGATIVE
SPEC GRAV UA: 1.01 (ref 1.005–1.030)
Urobilinogen, Ur: 0.2 mg/dL (ref 0.2–1.0)

## 2016-05-08 LAB — BAYER DCA HB A1C WAIVED: HB A1C (BAYER DCA - WAIVED): 6.5 % (ref <7.0)

## 2016-05-08 NOTE — Patient Instructions (Signed)

## 2016-05-08 NOTE — Progress Notes (Signed)
Subjective:  Patient ID: David Black, male    DOB: 12-25-1960  Age: 55 y.o. MRN: [id number]  CC: Diabetes (pt here for routine diabetes follow up and medication refills)   HPI David Black presents for  follow-up of hypertension. Patient has no history of headache chest pain or shortness of breath or recent cough. Patient also denies symptoms of TIA such as numbness weakness lateralizing. Patient checks  blood pressure at home and has not had any elevated readings recently. Patient denies side effects from his medication. States taking it regularly.  Patient also  in for follow-up of elevated cholesterol. Doing well without complaints on current medication. Denies side effects of statin including myalgia and arthralgia and nausea. Also in today for liver function testing. Currently no chest pain, shortness of breath or other cardiovascular related symptoms noted.  Follow-up of diabetes. Patient does check blood sugar at home. Readings run between 120-160 fasting Patient denies symptoms such as polyuria, polydipsia, excessive hunger, nausea No significant hypoglycemic spells noted. Medications as noted below. Taking them regularly without complication/adverse reaction being reported today.    History David Black has a past medical history of CAD (coronary artery disease); Chest discomfort (07/18/2010); Depression with anxiety; Diabetes mellitus without complication ([place]); Dyslipidemia; GERD (gastroesophageal reflux disease); Hyperlipidemia; Hypertension; Obesity, unspecified; Smoker; and SOB (shortness of breath) (07/18/2010).   He has a past surgical history that includes Ventral hernia repair (06/2010); Cholecystectomy; Hernia repair; and Finger surgery.   His family history includes Breast cancer in his paternal aunt; Coronary artery disease in his other; Diabetes in his mother; Heart disease in his father and paternal uncle; Hyperlipidemia in his father and mother; Hypertension in his  father and mother; Kidney disease in his father; Testicular cancer in his paternal uncle.He reports that he quit smoking about 13 years ago. His smoking use included Cigarettes. He has a 30.00 pack-year smoking history. He has never used smokeless tobacco. He reports that he does not drink alcohol or use drugs.  Current Outpatient Prescriptions on File Prior to Visit  Medication Sig Dispense Refill  . aspirin 81 MG tablet Take 81 mg by mouth daily.      . canagliflozin (INVOKANA) 300 MG TABS tablet Take 1 tablet (300 mg total) by mouth daily before breakfast. 90 tablet 1  . cyclobenzaprine (FLEXERIL) 10 MG tablet Take 1 tablet (10 mg total) by mouth 3 (three) times daily as needed for muscle spasms. 30 tablet 0  . fenofibrate 54 MG tablet TAKE 1 TABLET ONCE DAILY 30 tablet 0  . gabapentin (NEURONTIN) 300 MG capsule TAKE (1) CAPSULE THREE TIMES DAILY. 90 capsule 1  . glucose blood test strip Use as instructed 100 each 12  . meloxicam (MOBIC) 15 MG tablet TAKE 1 TABLET DAILY 30 tablet 0  . ONETOUCH DELICA LANCETS 85U MISC CHECK BLOOD SUGAR 2 TIMES A DAY 100 each 0  . OXYGEN Inhale 2 L/min into the lungs Nightly.    . pantoprazole (PROTONIX) 40 MG tablet TAKE 1 TABLET DAILY 30 tablet 1  . valsartan-hydrochlorothiazide (DIOVAN-HCT) 320-25 MG tablet TAKE 1 TABLET DAILY 30 tablet 0  . Vitamin D, Ergocalciferol, (DRISDOL) 50000 units CAPS capsule TAKE 1 CAPSULE 2 TIMES A WEEK 16 capsule 0   No current facility-administered medications on file prior to visit.     ROS Review of Systems  Constitutional: Negative for chills, diaphoresis, fever and unexpected weight change.  HENT: Negative for congestion, hearing loss, rhinorrhea and sore throat.   Eyes: Negative  for visual disturbance.  Respiratory: Negative for cough and shortness of breath.   Cardiovascular: Negative for chest pain.  Gastrointestinal: Negative for abdominal pain, constipation and diarrhea.  Genitourinary: Negative for dysuria and  flank pain.  Musculoskeletal: Negative for arthralgias and joint swelling.  Skin: Negative for rash.  Neurological: Negative for dizziness and headaches.  Psychiatric/Behavioral: Negative for dysphoric mood and sleep disturbance.    Objective:  BP 130/82   Pulse 84   Temp 98 F (36.7 C) (Oral)   Ht 5' 8"  (1.727 m)   Wt 256 lb 4 oz (116.2 kg)   BMI 38.96 kg/m   BP Readings from Last 3 Encounters:  05/08/16 130/82  04/03/16 109/66  11/02/15 124/68    Wt Readings from Last 3 Encounters:  05/08/16 256 lb 4 oz (116.2 kg)  04/03/16 256 lb (116.1 kg)  11/02/15 271 lb 2 oz (123 kg)     Physical Exam  Constitutional: He is oriented to person, place, and time. He appears well-developed and well-nourished. No distress.  HENT:  Head: Normocephalic and atraumatic.  Right Ear: External ear normal.  Left Ear: External ear normal.  Nose: Nose normal.  Mouth/Throat: Oropharynx is clear and moist.  Eyes: Conjunctivae and EOM are normal. Pupils are equal, round, and reactive to light.  Neck: Normal range of motion. Neck supple. No thyromegaly present.  Cardiovascular: Normal rate, regular rhythm and normal heart sounds.   No murmur heard. Pulmonary/Chest: Effort normal and breath sounds normal. No respiratory distress. He has no wheezes. He has no rales.  Abdominal: Soft. Bowel sounds are normal. He exhibits no distension. There is no tenderness.  Lymphadenopathy:    He has no cervical adenopathy.  Neurological: He is alert and oriented to person, place, and time. He has normal reflexes.  Skin: Skin is warm and dry.  Psychiatric: He has a normal mood and affect. His behavior is normal. Judgment and thought content normal.    Lab Results  Component Value Date   HGBA1C 6.1 07/26/2015   HGBA1C 5.8 02/15/2015   HGBA1C 6.1 08/17/2014    Lab Results  Component Value Date   WBC 6.4 07/26/2015   HGB 15.5 02/15/2015   HCT 47.8 07/26/2015   PLT 219 07/26/2015   GLUCOSE 106 (H)  11/02/2015   CHOL 163 11/02/2015   TRIG 166 (H) 11/02/2015   HDL 28 (L) 11/02/2015   LDLCALC 102 (H) 11/02/2015   ALT 29 11/02/2015   AST 22 11/02/2015   NA 142 11/02/2015   K 4.3 11/02/2015   CL 101 11/02/2015   CREATININE 1.18 11/02/2015   BUN 14 11/02/2015   CO2 26 11/02/2015   TSH 2.300 07/26/2015   PSA 0.7 05/17/2014   INR 0.98 09/12/2013   HGBA1C 6.1 07/26/2015   MICROALBUR neg 08/17/2014    No results found.  Assessment & Plan:   Dusan was seen today for diabetes.  Diagnoses and all orders for this visit:  Diabetes mellitus without complication ([place]) -     Bayer DCA Hb A1c Waived -     CBC with Differential/Platelet -     CMP14+EGFR -     Lipid panel -     Microalbumin / creatinine urine ratio -     Urinalysis  Essential hypertension -     CMP14+EGFR -     Urinalysis  Obesity, unspecified -     CMP14+EGFR -     Urinalysis  Hyperlipemia -     CMP14+EGFR -  Lipid panel -     Urinalysis   I have discontinued Mr. Davee traMADol and acetaminophen-codeine. I am also having him maintain his aspirin, glucose blood, OXYGEN, canagliflozin, ONETOUCH DELICA LANCETS 92C, pantoprazole, gabapentin, cyclobenzaprine, fenofibrate, valsartan-hydrochlorothiazide, meloxicam, and Vitamin D (Ergocalciferol).  A1c 6.5 Discussed weight loss   Follow-up: Return in about 3 months (around 08/07/2016).  Claretta Fraise, M.D.

## 2016-05-09 ENCOUNTER — Telehealth: Payer: Self-pay | Admitting: Family Medicine

## 2016-05-09 LAB — MICROALBUMIN / CREATININE URINE RATIO
CREATININE, UR: 39.9 mg/dL
MICROALB/CREAT RATIO: <7.5 mg/g creat (ref 0.0–30.0)
Microalbumin, Urine: <3 ug/mL

## 2016-05-09 LAB — CMP14+EGFR
A/G RATIO: 1.9 (ref 1.2–2.2)
ALT: 18 IU/L (ref 0–44)
AST: 15 IU/L (ref 0–40)
Albumin: 4.7 g/dL (ref 3.5–5.5)
Alkaline Phosphatase: 41 IU/L (ref 39–117)
BILIRUBIN TOTAL: 0.3 mg/dL (ref 0.0–1.2)
BUN/Creatinine Ratio: 14 (ref 9–20)
BUN: 18 mg/dL (ref 6–24)
CHLORIDE: 98 mmol/L (ref 96–106)
CO2: 23 mmol/L (ref 18–29)
Calcium: 10 mg/dL (ref 8.7–10.2)
Creatinine, Ser: 1.31 mg/dL — ABNORMAL HIGH (ref 0.76–1.27)
GFR calc non Af Amer: 62 mL/min/{1.73_m2} (ref >59)
GFR, EST AFRICAN AMERICAN: 71 mL/min/{1.73_m2} (ref >59)
GLOBULIN, TOTAL: 2.5 g/dL (ref 1.5–4.5)
Glucose: 147 mg/dL — ABNORMAL HIGH (ref 65–99)
POTASSIUM: 4.3 mmol/L (ref 3.5–5.2)
SODIUM: 141 mmol/L (ref 134–144)
TOTAL PROTEIN: 7.2 g/dL (ref 6.0–8.5)

## 2016-05-09 LAB — CBC WITH DIFFERENTIAL/PLATELET
BASOS: 1 %
Basophils Absolute: 0.1 10*3/uL (ref 0.0–0.2)
EOS (ABSOLUTE): 0.3 10*3/uL (ref 0.0–0.4)
Eos: 4 %
HEMATOCRIT: 48.7 % (ref 37.5–51.0)
Hemoglobin: 16.3 g/dL (ref 12.6–17.7)
Immature Grans (Abs): 0.1 10*3/uL (ref 0.0–0.1)
Immature Granulocytes: 1 %
Lymphocytes Absolute: 2 10*3/uL (ref 0.7–3.1)
Lymphs: 26 %
MCH: 29.6 pg (ref 26.6–33.0)
MCHC: 33.5 g/dL (ref 31.5–35.7)
MCV: 88 fL (ref 79–97)
MONOS ABS: 0.5 10*3/uL (ref 0.1–0.9)
Monocytes: 7 %
NEUTROS ABS: 4.6 10*3/uL (ref 1.4–7.0)
Neutrophils: 61 %
PLATELETS: 228 10*3/uL (ref 150–379)
RBC: 5.51 x10E6/uL (ref 4.14–5.80)
RDW: 13.6 % (ref 12.3–15.4)
WBC: 7.5 10*3/uL (ref 3.4–10.8)

## 2016-05-09 LAB — LIPID PANEL
CHOLESTEROL TOTAL: 149 mg/dL (ref 100–199)
Chol/HDL Ratio: 6 ratio units — ABNORMAL HIGH (ref 0.0–5.0)
HDL: 25 mg/dL — ABNORMAL LOW (ref >39)
LDL Calculated: 87 mg/dL (ref 0–99)
Triglycerides: 183 mg/dL — ABNORMAL HIGH (ref 0–149)
VLDL CHOLESTEROL CAL: 37 mg/dL (ref 5–40)

## 2016-05-10 NOTE — Progress Notes (Signed)
Pt aware.

## 2016-05-14 ENCOUNTER — Telehealth: Payer: Self-pay | Admitting: Family Medicine

## 2016-05-21 ENCOUNTER — Ambulatory Visit (INDEPENDENT_AMBULATORY_CARE_PROVIDER_SITE_OTHER): Payer: BLUE CROSS/BLUE SHIELD

## 2016-05-21 DIAGNOSIS — Z23 Encounter for immunization: Secondary | ICD-10-CM

## 2016-05-24 NOTE — Telephone Encounter (Signed)
Detailed message left for patient by [facility] with results.

## 2016-05-31 ENCOUNTER — Other Ambulatory Visit: Payer: Self-pay | Admitting: Family Medicine

## 2016-06-22 ENCOUNTER — Other Ambulatory Visit: Payer: Self-pay | Admitting: Family Medicine

## 2016-06-25 ENCOUNTER — Other Ambulatory Visit: Payer: Self-pay | Admitting: Family Medicine

## 2016-07-25 ENCOUNTER — Ambulatory Visit (INDEPENDENT_AMBULATORY_CARE_PROVIDER_SITE_OTHER): Payer: BLUE CROSS/BLUE SHIELD

## 2016-07-25 ENCOUNTER — Ambulatory Visit (INDEPENDENT_AMBULATORY_CARE_PROVIDER_SITE_OTHER): Payer: BLUE CROSS/BLUE SHIELD | Admitting: Pediatrics

## 2016-07-25 ENCOUNTER — Encounter: Payer: Self-pay | Admitting: Pediatrics

## 2016-07-25 VITALS — BP 116/75 | HR 130 | Temp 101.6°F | Ht 68.0 in | Wt 258.6 lb

## 2016-07-25 DIAGNOSIS — R6889 Other general symptoms and signs: Secondary | ICD-10-CM

## 2016-07-25 DIAGNOSIS — R109 Unspecified abdominal pain: Secondary | ICD-10-CM

## 2016-07-25 DIAGNOSIS — R05 Cough: Secondary | ICD-10-CM | POA: Diagnosis not present

## 2016-07-25 DIAGNOSIS — E119 Type 2 diabetes mellitus without complications: Secondary | ICD-10-CM

## 2016-07-25 DIAGNOSIS — R059 Cough, unspecified: Secondary | ICD-10-CM

## 2016-07-25 DIAGNOSIS — R509 Fever, unspecified: Secondary | ICD-10-CM | POA: Diagnosis not present

## 2016-07-25 LAB — URINALYSIS, COMPLETE
Bilirubin, UA: NEGATIVE
Leukocytes, UA: NEGATIVE
Nitrite, UA: NEGATIVE
Protein, UA: NEGATIVE
Specific Gravity, UA: 1.01 (ref 1.005–1.030)
Urobilinogen, Ur: 1 mg/dL (ref 0.2–1.0)
pH, UA: 5.5 (ref 5.0–7.5)

## 2016-07-25 LAB — MICROSCOPIC EXAMINATION
BACTERIA UA: NONE SEEN
RENAL EPITHEL UA: NONE SEEN /HPF

## 2016-07-25 LAB — VERITOR FLU A/B WAIVED
Influenza A: NEGATIVE
Influenza B: NEGATIVE

## 2016-07-25 MED ORDER — ONDANSETRON 4 MG PO TBDP: 4.0000 mg | ORAL_TABLET | Freq: Two times a day (BID) | ORAL | 0 refills | Status: DC | PRN

## 2016-07-25 NOTE — Progress Notes (Signed)
Marland Kitchen  Subjective:   Patient ID: SHEDRIC FREDERICKS, male    DOB: 12-30-1960, 55 y.o.   MRN: [id number] CC: Cough; Chest congestion; and Shortness of Breath  HPI: David Black is a 55 y.o. male presenting for Cough; Chest congestion; and Shortness of Breath  Started coughing two nights ago Went home from work early yesterday, felt like he was "hit by a truck" 101.4 now Not coughing as much now as two days ago Ache all over now Takes a deep breath and has some chest pain Some congestion Some runny nose No sore throat No ear pain No dysuria Some abd discomfort Appetite down Some nausea starting today No vomiting  Relevant past medical, surgical, family and social history reviewed. Allergies and medications reviewed and updated. History  Smoking Status  . Former Smoker  . Packs/day: 1.50  . Years: 20.00  . Types: Cigarettes  . Quit date: 08/20/2002  Smokeless Tobacco  . Never Used   ROS: Per HPI   Objective:    BP 116/75   Pulse (!) 130   Temp (!) 101.6 F (38.7 C) (Oral)   Ht 5' 8" (1.727 m)   Wt 258 lb 9.6 oz (117.3 kg)   SpO2 94%   BMI 39.32 kg/m   Wt Readings from Last 3 Encounters:  07/25/16 258 lb 9.6 oz (117.3 kg)  05/08/16 256 lb 4 oz (116.2 kg)  04/03/16 256 lb (116.1 kg)    Gen: NAD, alert, cooperative with exam, NCAT EYES: EOMI, no conjunctival injection, or no icterus ENT:  R TM slightly erythematous, L TM dull, OP with mild erythema LYMPH: no cervical LAD CV: NRRR, normal S1/S2, no murmur, distal pulses 2+ b/l Resp: CTABL, no wheezes, normal WOB Ext: No edema, warm Neuro: Alert and oriented MSK: normal muscle bulk  Assessment & Plan:  Dierks was seen today for cough, chest congestion and shortness of breath.  Diagnoses and all orders for this visit:  Flu-like symptoms Flu negative Likely viral syndrome Started with a cough Some nausea today CXR normal Will send labs Sent in zofran Let me know if not improving -     Veritor Flu A/B  Waived -     DG Chest 2 View; Future -     ondansetron (ZOFRAN-ODT) 4 MG disintegrating tablet; Take 1 tablet (4 mg total) by mouth every 12 (twelve) hours as needed for nausea or vomiting. -     CBC with Differential/Platelet -     CMP14+EGFR -     Lipase  Cough -     DG Chest 2 View; Future  Abdominal pain, unspecified abdominal location -     CBC with Differential/Platelet -     CMP14+EGFR -     Lipase  Fever, unspecified fever cause -     CBC with Differential/Platelet -     CMP14+EGFR -     Lipase   Follow up plan: As needed Assunta Found, MD [place]

## 2016-07-25 NOTE — Addendum Note (Signed)
Addended by: Eustaquio Maize on: 07/25/2016 05:43 PM   Modules accepted: Orders

## 2016-07-26 LAB — CBC WITH DIFFERENTIAL/PLATELET
BASOS: 0 %
Basophils Absolute: 0 10*3/uL (ref 0.0–0.2)
EOS (ABSOLUTE): 0 10*3/uL (ref 0.0–0.4)
Eos: 0 %
Hematocrit: 49.5 % (ref 37.5–51.0)
Hemoglobin: 17.2 g/dL (ref 13.0–17.7)
IMMATURE GRANS (ABS): 0 10*3/uL (ref 0.0–0.1)
Immature Granulocytes: 0 %
Lymphocytes Absolute: 0.2 10*3/uL — ABNORMAL LOW (ref 0.7–3.1)
Lymphs: 3 %
MCH: 30.5 pg (ref 26.6–33.0)
MCHC: 34.7 g/dL (ref 31.5–35.7)
MCV: 88 fL (ref 79–97)
MONOS ABS: 0.4 10*3/uL (ref 0.1–0.9)
Monocytes: 4 %
NEUTROS ABS: 8.7 10*3/uL — AB (ref 1.4–7.0)
Neutrophils: 93 %
PLATELETS: 209 10*3/uL (ref 150–379)
RBC: 5.64 x10E6/uL (ref 4.14–5.80)
RDW: 13.8 % (ref 12.3–15.4)
WBC: 9.4 10*3/uL (ref 3.4–10.8)

## 2016-07-26 LAB — CMP14+EGFR
A/G RATIO: 1.6 (ref 1.2–2.2)
ALT: 58 IU/L — AB (ref 0–44)
AST: 43 IU/L — ABNORMAL HIGH (ref 0–40)
Albumin: 4.6 g/dL (ref 3.5–5.5)
Alkaline Phosphatase: 48 IU/L (ref 39–117)
BILIRUBIN TOTAL: 0.9 mg/dL (ref 0.0–1.2)
BUN/Creatinine Ratio: 16 (ref 9–20)
BUN: 20 mg/dL (ref 6–24)
CHLORIDE: 96 mmol/L (ref 96–106)
CO2: 21 mmol/L (ref 18–29)
Calcium: 9.3 mg/dL (ref 8.7–10.2)
Creatinine, Ser: 1.29 mg/dL — ABNORMAL HIGH (ref 0.76–1.27)
GFR calc non Af Amer: 63 mL/min/{1.73_m2} (ref >59)
GFR, EST AFRICAN AMERICAN: 73 mL/min/{1.73_m2} (ref >59)
GLOBULIN, TOTAL: 2.9 g/dL (ref 1.5–4.5)
Glucose: 171 mg/dL — ABNORMAL HIGH (ref 65–99)
POTASSIUM: 4.4 mmol/L (ref 3.5–5.2)
SODIUM: 137 mmol/L (ref 134–144)
TOTAL PROTEIN: 7.5 g/dL (ref 6.0–8.5)

## 2016-07-26 LAB — LIPASE: Lipase: 21 U/L (ref 13–78)

## 2016-07-27 LAB — HGB A1C W/O EAG: Hgb A1c MFr Bld: 6.6 % — ABNORMAL HIGH (ref 4.8–5.6)

## 2016-07-27 LAB — SPECIMEN STATUS REPORT

## 2016-08-08 ENCOUNTER — Encounter: Payer: Self-pay | Admitting: *Deleted

## 2016-08-27 ENCOUNTER — Encounter: Payer: BLUE CROSS/BLUE SHIELD | Admitting: Family Medicine

## 2016-08-28 ENCOUNTER — Encounter: Payer: Self-pay | Admitting: Family Medicine

## 2016-08-28 ENCOUNTER — Ambulatory Visit (INDEPENDENT_AMBULATORY_CARE_PROVIDER_SITE_OTHER): Payer: BLUE CROSS/BLUE SHIELD | Admitting: Family Medicine

## 2016-08-28 VITALS — BP 128/83 | HR 77 | Temp 97.1°F | Ht 68.0 in | Wt 258.0 lb

## 2016-08-28 DIAGNOSIS — F341 Dysthymic disorder: Secondary | ICD-10-CM

## 2016-08-28 DIAGNOSIS — I25118 Atherosclerotic heart disease of native coronary artery with other forms of angina pectoris: Secondary | ICD-10-CM

## 2016-08-28 DIAGNOSIS — I10 Essential (primary) hypertension: Secondary | ICD-10-CM

## 2016-08-28 DIAGNOSIS — J45909 Unspecified asthma, uncomplicated: Secondary | ICD-10-CM | POA: Diagnosis not present

## 2016-08-28 DIAGNOSIS — Z0000 Encounter for general adult medical examination without abnormal findings: Secondary | ICD-10-CM

## 2016-08-28 DIAGNOSIS — E782 Mixed hyperlipidemia: Secondary | ICD-10-CM

## 2016-08-28 DIAGNOSIS — E119 Type 2 diabetes mellitus without complications: Secondary | ICD-10-CM

## 2016-08-28 LAB — URINALYSIS
BILIRUBIN UA: NEGATIVE
Ketones, UA: NEGATIVE
Leukocytes, UA: NEGATIVE
Nitrite, UA: NEGATIVE
PH UA: 5.5 (ref 5.0–7.5)
PROTEIN UA: NEGATIVE
RBC UA: NEGATIVE
SPEC GRAV UA: 1.015 (ref 1.005–1.030)
UUROB: 0.2 mg/dL (ref 0.2–1.0)

## 2016-08-28 LAB — BAYER DCA HB A1C WAIVED: HB A1C: 6.2 % (ref <7.0)

## 2016-08-28 NOTE — Progress Notes (Signed)
Subjective:  Patient ID: David Black, male    DOB: June 03, 1961  Age: 56 y.o. MRN: [id number]  CC: Annual Exam (pt here today for CPE, no concerns voiced)   HPI David Black presents for CPE.  Pt. Also followed  for hypertension. Patient has no history of headache chest pain or shortness of breath or recent cough. Patient also denies symptoms of TIA such as numbness weakness lateralizing. Patient checks  blood pressure at home and has not had any elevated readings recently. Patient denies side effects from his medication. States taking it regularly.  Patient also  in for follow-up of elevated cholesterol. Doing well without complaints on current medication. Denies side effects of statin including myalgia and arthralgia and nausea. Also in today for liver function testing. Currently no chest pain, shortness of breath or other cardiovascular related symptoms noted.  Follow-up of diabetes. Patient does check blood sugar at home. Readings run between 120 - 130.  Patient denies symptoms such as polyuria, polydipsia, excessive hunger, nausea No significant hypoglycemic spells noted. Medications as noted below. Taking them regularly without complication/adverse reaction being reported today.  Patient uses oxygen at night due to testing that showed that he snores a lot and apparently quit breathing during the night. However, he states that his sleep apnea test was negative. His chart has a diagnosis of of extrinsic asthma. It's unclear at this time why this was diagnosed. He denies shortness of breath. He does not wheeze or have exertional dyspnea.  A sleep study from 2011 indicates desats to 83%  History David Black has a past medical history of CAD (coronary artery disease); Chest discomfort (07/18/2010); Depression with anxiety; Diabetes mellitus without complication ([place]); Dyslipidemia; GERD (gastroesophageal reflux disease); Hyperlipidemia; Hypertension; Obesity, unspecified; Smoker; and SOB  (shortness of breath) (07/18/2010).   He has a past surgical history that includes Ventral hernia repair (06/2010); Cholecystectomy; Hernia repair; and Finger surgery.   His family history includes Breast cancer in his paternal aunt; Coronary artery disease in his other; Diabetes in his mother; Heart disease in his father and paternal uncle; Hyperlipidemia in his father and mother; Hypertension in his father and mother; Kidney disease in his father; Testicular cancer in his paternal uncle.He reports that he quit smoking about 14 years ago. His smoking use included Cigarettes. He has a 30.00 pack-year smoking history. He has never used smokeless tobacco. He reports that he does not drink alcohol or use drugs.  Current Outpatient Prescriptions on File Prior to Visit  Medication Sig Dispense Refill  . aspirin 81 MG tablet Take 81 mg by mouth daily.      . cyclobenzaprine (FLEXERIL) 10 MG tablet Take 1 tablet (10 mg total) by mouth 3 (three) times daily as needed for muscle spasms. 30 tablet 0  . fenofibrate 54 MG tablet TAKE 1 TABLET ONCE DAILY 30 tablet 4  . gabapentin (NEURONTIN) 300 MG capsule TAKE (1) CAPSULE THREE TIMES DAILY. 90 capsule 2  . glucose blood test strip Use as instructed 100 each 12  . INVOKANA 300 MG TABS tablet Take 1 tablet (300 mg total) by mouth daily before breakfast. 90 tablet 0  . meloxicam (MOBIC) 15 MG tablet TAKE 1 TABLET DAILY 30 tablet 2  . ONETOUCH DELICA LANCETS 07M MISC CHECK BLOOD SUGAR 2 TIMES A DAY 100 each 0  . OXYGEN Inhale 2 L/min into the lungs Nightly.    . pantoprazole (PROTONIX) 40 MG tablet TAKE 1 TABLET DAILY 30 tablet 4  . valsartan-hydrochlorothiazide (  DIOVAN-HCT) 320-25 MG tablet TAKE 1 TABLET DAILY 30 tablet 4  . Vitamin D, Ergocalciferol, (DRISDOL) 50000 units CAPS capsule Take 1 capsule (50,000 Units total) by mouth every 7 (seven) days. 12 capsule 3   No current facility-administered medications on file prior to visit.     ROS Review of  Systems  Constitutional: Negative for activity change, appetite change, chills, diaphoresis, fatigue, fever and unexpected weight change.  HENT: Negative for congestion, ear pain, hearing loss, postnasal drip, rhinorrhea, sore throat, tinnitus and trouble swallowing.   Eyes: Negative for photophobia, pain, discharge, redness and visual disturbance.  Respiratory: Negative for apnea, cough, choking, chest tightness, shortness of breath, wheezing and stridor.   Cardiovascular: Negative for chest pain, palpitations and leg swelling.  Gastrointestinal: Negative for abdominal distention, abdominal pain, blood in stool, constipation, diarrhea, nausea and vomiting.  Endocrine: Negative for cold intolerance, heat intolerance, polydipsia, polyphagia and polyuria.  Genitourinary: Negative for difficulty urinating, dysuria, enuresis, flank pain, frequency, genital sores, hematuria and urgency.  Musculoskeletal: Negative for arthralgias and joint swelling.  Skin: Negative for color change, rash and wound.  Allergic/Immunologic: Negative for immunocompromised state.  Neurological: Negative for dizziness, tremors, seizures, syncope, facial asymmetry, speech difficulty, weakness, light-headedness, numbness and headaches.  Hematological: Does not bruise/bleed easily.  Psychiatric/Behavioral: Negative for agitation, behavioral problems, confusion, decreased concentration, dysphoric mood, hallucinations, sleep disturbance and suicidal ideas. The patient is not nervous/anxious and is not hyperactive.     Objective:  BP 128/83   Pulse 77   Temp 97.1 F (36.2 C) (Oral)   Ht 5' 8"  (1.727 m)   Wt 258 lb (117 kg)   BMI 39.23 kg/m   BP Readings from Last 3 Encounters:  08/28/16 128/83  07/25/16 116/75  05/08/16 130/82    Wt Readings from Last 3 Encounters:  08/28/16 258 lb (117 kg)  07/25/16 258 lb 9.6 oz (117.3 kg)  05/08/16 256 lb 4 oz (116.2 kg)     Physical Exam  Constitutional: He is oriented to  person, place, and time. He appears well-developed and well-nourished.  HENT:  Head: Normocephalic and atraumatic.  Mouth/Throat: Oropharynx is clear and moist.  Eyes: EOM are normal. Pupils are equal, round, and reactive to light.  Neck: Normal range of motion. No tracheal deviation present. No thyromegaly present.  Cardiovascular: Normal rate, regular rhythm and normal heart sounds.  Exam reveals no gallop and no friction rub.   No murmur heard. Pulmonary/Chest: Breath sounds normal. He has no wheezes. He has no rales.  Abdominal: Soft. He exhibits no mass. There is no tenderness.  Musculoskeletal: Normal range of motion. He exhibits no edema.  Neurological: He is alert and oriented to person, place, and time.  Skin: Skin is warm and dry.  Psychiatric: He has a normal mood and affect.    No components found for: [id number]  Lab Results  Component Value Date   WBC 9.4 07/25/2016   HGB 15.5 02/15/2015   HCT 49.5 07/25/2016   PLT 209 07/25/2016   GLUCOSE 171 (H) 07/25/2016   CHOL 149 05/08/2016   TRIG 183 (H) 05/08/2016   HDL 25 (L) 05/08/2016   LDLCALC 87 05/08/2016   ALT 58 (H) 07/25/2016   AST 43 (H) 07/25/2016   NA 137 07/25/2016   K 4.4 07/25/2016   CL 96 07/25/2016   CREATININE 1.29 (H) 07/25/2016   BUN 20 07/25/2016   CO2 21 07/25/2016   TSH 2.300 07/26/2015   PSA 0.7 05/17/2014   INR 0.98 09/12/2013  HGBA1C 6.6 (H) 07/25/2016   MICROALBUR neg 08/17/2014       Assessment & Plan:   Nas was seen today for annual exam.  Diagnoses and all orders for this visit:  Mixed hyperlipidemia -     Lipid panel  Essential hypertension  DEPRESSION/ANXIETY  Atherosclerosis of coronary artery of native heart with stable angina pectoris, unspecified vessel or lesion type ([place])  Diabetes mellitus without complication ([place]) -     Bayer DCA Hb A1c Waived -     CBC with Differential/Platelet -     CMP14+EGFR -     Microalbumin / creatinine urine ratio -      Urinalysis -     Lipid panel -     Ambulatory referral to Ophthalmology -     PR BREATHING CAPACITY TEST -     PSA Total (Reflex To Free)  Extrinsic asthma without complication, unspecified asthma severity, unspecified whether persistent -     PR BREATHING CAPACITY TEST   I have discontinued Mr. Christy's ondansetron. I am also having him maintain his aspirin, glucose blood, OXYGEN, ONETOUCH DELICA LANCETS 82U, cyclobenzaprine, pantoprazole, gabapentin, fenofibrate, valsartan-hydrochlorothiazide, meloxicam, INVOKANA, and Vitamin D (Ergocalciferol).  No orders of the defined types were placed in this encounter.    Follow-up: Return in about 3 months (around 11/26/2016).  Claretta Fraise, M.D.

## 2016-08-29 LAB — CBC WITH DIFFERENTIAL/PLATELET
BASOS ABS: 0.1 10*3/uL (ref 0.0–0.2)
Basos: 1 %
EOS (ABSOLUTE): 0.2 10*3/uL (ref 0.0–0.4)
EOS: 3 %
HEMATOCRIT: 48 % (ref 37.5–51.0)
Hemoglobin: 17 g/dL (ref 13.0–17.7)
IMMATURE GRANULOCYTES: 1 %
Immature Grans (Abs): 0 10*3/uL (ref 0.0–0.1)
LYMPHS ABS: 1.9 10*3/uL (ref 0.7–3.1)
Lymphs: 27 %
MCH: 31.6 pg (ref 26.6–33.0)
MCHC: 35.4 g/dL (ref 31.5–35.7)
MCV: 89 fL (ref 79–97)
MONOS ABS: 0.5 10*3/uL (ref 0.1–0.9)
Monocytes: 8 %
NEUTROS PCT: 60 %
Neutrophils Absolute: 4.3 10*3/uL (ref 1.4–7.0)
PLATELETS: 220 10*3/uL (ref 150–379)
RBC: 5.38 x10E6/uL (ref 4.14–5.80)
RDW: 13.7 % (ref 12.3–15.4)
WBC: 7 10*3/uL (ref 3.4–10.8)

## 2016-08-29 LAB — CMP14+EGFR
ALK PHOS: 44 IU/L (ref 39–117)
ALT: 41 IU/L (ref 0–44)
AST: 28 IU/L (ref 0–40)
Albumin/Globulin Ratio: 1.6 (ref 1.2–2.2)
Albumin: 4.8 g/dL (ref 3.5–5.5)
BUN/Creatinine Ratio: 14 (ref 9–20)
BUN: 19 mg/dL (ref 6–24)
Bilirubin Total: 0.5 mg/dL (ref 0.0–1.2)
CALCIUM: 10.1 mg/dL (ref 8.7–10.2)
CO2: 24 mmol/L (ref 18–29)
CREATININE: 1.35 mg/dL — AB (ref 0.76–1.27)
Chloride: 100 mmol/L (ref 96–106)
GFR calc Af Amer: 69 mL/min/{1.73_m2} (ref >59)
GFR, EST NON AFRICAN AMERICAN: 60 mL/min/{1.73_m2} (ref >59)
GLUCOSE: 100 mg/dL — AB (ref 65–99)
Globulin, Total: 3 g/dL (ref 1.5–4.5)
Potassium: 4.1 mmol/L (ref 3.5–5.2)
SODIUM: 141 mmol/L (ref 134–144)
Total Protein: 7.8 g/dL (ref 6.0–8.5)

## 2016-08-29 LAB — LIPID PANEL
CHOLESTEROL TOTAL: 158 mg/dL (ref 100–199)
Chol/HDL Ratio: 5.6 ratio units — ABNORMAL HIGH (ref 0.0–5.0)
HDL: 28 mg/dL — ABNORMAL LOW (ref >39)
LDL CALC: 98 mg/dL (ref 0–99)
TRIGLYCERIDES: 158 mg/dL — AB (ref 0–149)
VLDL CHOLESTEROL CAL: 32 mg/dL (ref 5–40)

## 2016-08-29 LAB — MICROALBUMIN / CREATININE URINE RATIO
Creatinine, Urine: 97.1 mg/dL
MICROALB/CREAT RATIO: 4.8 mg/g{creat} (ref 0.0–30.0)
Microalbumin, Urine: 4.7 ug/mL

## 2016-08-29 LAB — PSA TOTAL (REFLEX TO FREE): Prostate Specific Ag, Serum: 0.9 ng/mL (ref 0.0–4.0)

## 2016-09-04 ENCOUNTER — Other Ambulatory Visit: Payer: Self-pay | Admitting: Family Medicine

## 2016-09-11 ENCOUNTER — Other Ambulatory Visit: Payer: Self-pay | Admitting: Family Medicine

## 2016-10-05 ENCOUNTER — Other Ambulatory Visit: Payer: Self-pay | Admitting: Family Medicine

## 2016-10-31 ENCOUNTER — Other Ambulatory Visit: Payer: Self-pay | Admitting: Family Medicine

## 2016-12-13 ENCOUNTER — Other Ambulatory Visit: Payer: Self-pay | Admitting: Family Medicine

## 2017-01-11 ENCOUNTER — Other Ambulatory Visit: Payer: Self-pay | Admitting: Family Medicine

## 2017-01-24 ENCOUNTER — Ambulatory Visit (INDEPENDENT_AMBULATORY_CARE_PROVIDER_SITE_OTHER): Payer: BLUE CROSS/BLUE SHIELD | Admitting: Physician Assistant

## 2017-01-24 ENCOUNTER — Encounter: Payer: Self-pay | Admitting: Physician Assistant

## 2017-01-24 VITALS — BP 127/79 | HR 69 | Temp 98.1°F | Ht 68.0 in | Wt 235.0 lb

## 2017-01-24 DIAGNOSIS — L600 Ingrowing nail: Secondary | ICD-10-CM

## 2017-01-24 MED ORDER — CEPHALEXIN 500 MG PO CAPS: 500.0000 mg | ORAL_CAPSULE | Freq: Two times a day (BID) | ORAL | 0 refills | Status: DC

## 2017-01-24 NOTE — Progress Notes (Signed)
Subjective:     Patient ID: David Black, male   DOB: 18-Apr-1961, 56 y.o.   MRN: [id number]  HPI Pt with ingrown toenail to the R great toe + pain and drainage from the site Hx of same with partial nail removal 2 years ago No OTC meds for sx   Review of Systems  Constitutional: Negative.   Musculoskeletal: Negative.        Objective:   Physical Exam  Constitutional: He appears well-developed and well-nourished.  Nursing note and vitals reviewed. Note made on 20lb weight loss which pt states is due to exercise and diet + erythema and edema to the lateral nail fold of the R great toe + drainage noted No erythema or edema to the prox toe FROM of the toe Good pulses/sensory to the foot     Assessment:     Ingrown right big toenail      Plan:     Proper nail trimming reviewed Keflex rx Congrat. on 20lb weight loss through exercise and diet Cotton wedging reviewed F/U prn for toe Keep regular f/u appt for chronic health issues

## 2017-01-24 NOTE — Patient Instructions (Signed)
Ingrown Toenail An ingrown toenail occurs when the corner or sides of your toenail grow into the surrounding skin. The big toe is most commonly affected, but it can happen to any of your toes. If your ingrown toenail is not treated, you will be at risk for infection. What are the causes? This condition may be caused by:  Wearing shoes that are too small or tight.  Injury or trauma, such as stubbing your toe or having your toe stepped on.  Improper cutting or care of your toenails.  Being born with (congenital) nail or foot abnormalities, such as having a nail that is too big for your toe.  What increases the risk? Risk factors for an ingrown toenail include:  Age. Your nails tend to thicken as you get older, so ingrown nails are more common in older people.  Diabetes.  Cutting your toenails incorrectly.  Blood circulation problems.  What are the signs or symptoms? Symptoms may include:  Pain, soreness, or tenderness.  Redness.  Swelling.  Hardening of the skin surrounding the toe.  Your ingrown toenail may be infected if there is fluid, pus, or drainage. How is this diagnosed? An ingrown toenail may be diagnosed by medical history and physical exam. If your toenail is infected, your health care provider may test a sample of the drainage. How is this treated? Treatment depends on the severity of your ingrown toenail. Some ingrown toenails may be treated at home. More severe or infected ingrown toenails may require surgery to remove all or part of the nail. Infected ingrown toenails may also be treated with antibiotic medicines. Follow these instructions at home:  If you were prescribed an antibiotic medicine, finish all of it even if you start to feel better.  Soak your foot in warm soapy water for 20 minutes, 3 times per day or as directed by your health care provider.  Carefully lift the edge of the nail away from the sore skin by wedging a small piece of cotton under  the corner of the nail. This may help with the pain. Be careful not to cause more injury to the area.  Wear shoes that fit well. If your ingrown toenail is causing you pain, try wearing sandals, if possible.  Trim your toenails regularly and carefully. Do not cut them in a curved shape. Cut your toenails straight across. This prevents injury to the skin at the corners of the toenail.  Keep your feet clean and dry.  If you are having trouble walking and are given crutches by your health care provider, use them as directed.  Do not pick at your toenail or try to remove it yourself.  Take medicines only as directed by your health care provider.  Keep all follow-up visits as directed by your health care provider. This is important. Contact a health care provider if:  Your symptoms do not improve with treatment. Get help right away if:  You have red streaks that start at your foot and go up your leg.  You have a fever.  You have increased redness, swelling, or pain.  You have fluid, blood, or pus coming from your toenail. This information is not intended to replace advice given to you by your health care provider. Make sure you discuss any questions you have with your health care provider. Document Released: 08/03/2000 Document Revised: 01/06/2016 Document Reviewed: 06/30/2014 Elsevier Interactive Patient Education  2018 [employer]  

## 2017-02-11 ENCOUNTER — Other Ambulatory Visit: Payer: Self-pay | Admitting: Family Medicine

## 2017-03-12 ENCOUNTER — Other Ambulatory Visit: Payer: Self-pay | Admitting: Family Medicine

## 2017-03-19 ENCOUNTER — Emergency Department (HOSPITAL_COMMUNITY): Payer: BLUE CROSS/BLUE SHIELD

## 2017-03-19 ENCOUNTER — Emergency Department (HOSPITAL_COMMUNITY)
Admission: EM | Admit: 2017-03-19 | Discharge: 2017-03-19 | Disposition: A | Discharge status: Home / Self Care | Payer: BLUE CROSS/BLUE SHIELD | Attending: Emergency Medicine | Admitting: Emergency Medicine

## 2017-03-19 ENCOUNTER — Ambulatory Visit (INDEPENDENT_AMBULATORY_CARE_PROVIDER_SITE_OTHER): Payer: BLUE CROSS/BLUE SHIELD | Admitting: Physician Assistant

## 2017-03-19 ENCOUNTER — Encounter (HOSPITAL_COMMUNITY): Payer: Self-pay | Admitting: Emergency Medicine

## 2017-03-19 VITALS — BP 116/75 | HR 69 | Temp 97.3°F

## 2017-03-19 DIAGNOSIS — I25118 Atherosclerotic heart disease of native coronary artery with other forms of angina pectoris: Secondary | ICD-10-CM | POA: Diagnosis not present

## 2017-03-19 DIAGNOSIS — I10 Essential (primary) hypertension: Secondary | ICD-10-CM | POA: Insufficient documentation

## 2017-03-19 DIAGNOSIS — E119 Type 2 diabetes mellitus without complications: Secondary | ICD-10-CM | POA: Insufficient documentation

## 2017-03-19 DIAGNOSIS — Z79899 Other long term (current) drug therapy: Secondary | ICD-10-CM | POA: Diagnosis not present

## 2017-03-19 DIAGNOSIS — Z87891 Personal history of nicotine dependence: Secondary | ICD-10-CM | POA: Insufficient documentation

## 2017-03-19 DIAGNOSIS — Z7982 Long term (current) use of aspirin: Secondary | ICD-10-CM | POA: Diagnosis not present

## 2017-03-19 DIAGNOSIS — I2510 Atherosclerotic heart disease of native coronary artery without angina pectoris: Secondary | ICD-10-CM | POA: Diagnosis not present

## 2017-03-19 DIAGNOSIS — R079 Chest pain, unspecified: Secondary | ICD-10-CM | POA: Insufficient documentation

## 2017-03-19 DIAGNOSIS — I200 Unstable angina: Secondary | ICD-10-CM | POA: Diagnosis not present

## 2017-03-19 LAB — CBC WITH DIFFERENTIAL/PLATELET
Basophils Absolute: 0 10*3/uL (ref 0.0–0.1)
Basophils Relative: 1 %
Eosinophils Absolute: 0.2 10*3/uL (ref 0.0–0.7)
Eosinophils Relative: 2 %
HCT: 46 % (ref 39.0–52.0)
Hemoglobin: 16 g/dL (ref 13.0–17.0)
Lymphocytes Relative: 26 %
Lymphs Abs: 2 10*3/uL (ref 0.7–4.0)
MCH: 30.9 pg (ref 26.0–34.0)
MCHC: 34.8 g/dL (ref 30.0–36.0)
MCV: 88.8 fL (ref 78.0–100.0)
Monocytes Absolute: 0.5 10*3/uL (ref 0.1–1.0)
Monocytes Relative: 7 %
Neutro Abs: 5 10*3/uL (ref 1.7–7.7)
Neutrophils Relative %: 64 %
Platelets: 204 10*3/uL (ref 150–400)
RBC: 5.18 MIL/uL (ref 4.22–5.81)
RDW: 13.2 % (ref 11.5–15.5)
WBC: 7.7 10*3/uL (ref 4.0–10.5)

## 2017-03-19 LAB — BASIC METABOLIC PANEL
Anion gap: 9 (ref 5–15)
BUN: 24 mg/dL — ABNORMAL HIGH (ref 6–20)
CO2: 23 mmol/L (ref 22–32)
Calcium: 9.6 mg/dL (ref 8.9–10.3)
Chloride: 104 mmol/L (ref 101–111)
Creatinine, Ser: 1.09 mg/dL (ref 0.61–1.24)
GFR calc Af Amer: >60 mL/min (ref >60)
GFR calc non Af Amer: >60 mL/min (ref >60)
Glucose, Bld: 95 mg/dL (ref 65–99)
Potassium: 3.8 mmol/L (ref 3.5–5.1)
Sodium: 136 mmol/L (ref 135–145)

## 2017-03-19 LAB — TROPONIN I
Troponin I: <0.03 ng/mL (ref <0.03)
Troponin I: <0.03 ng/mL (ref <0.03)

## 2017-03-19 MED ORDER — IOPAMIDOL (ISOVUE-370) INJECTION 76%
INTRAVENOUS | Status: AC
  Administered 2017-03-19: 100 mL
  Filled 2017-03-19: qty 100

## 2017-03-19 MED ORDER — SODIUM CHLORIDE 0.9 % IV BOLUS (SEPSIS)
1000.0000 mL | Freq: Once | INTRAVENOUS | Status: AC
Start: 2017-03-19 — End: 2017-03-19
  Administered 2017-03-19: 1000 mL via INTRAVENOUS

## 2017-03-19 NOTE — ED Triage Notes (Signed)
Pt arrives via [facility] from his pcp office, pt reports central chest pressure that began around 0600 this am, cp went away the returned at 0800, reports some pain radiation to left arm. Pt had 324mg  asa,1 sl nitro with no relief of pain. Ems states pt also reported some pain between shoulder blades for the past week. VSS, CBG 96

## 2017-03-19 NOTE — Progress Notes (Signed)
Per Wilson Singer MD wait on creatinine before CT scan. Patient is diabetic.

## 2017-03-19 NOTE — ED Provider Notes (Signed)
[facility] DEPT Provider Note   CSN: [id number] Arrival date & time: 03/19/17  1047  By signing my name below, I, Dora Sims, attest that this documentation has been prepared under the direction and in the presence of physician practitioner, Virgel Manifold, MD. Electronically Signed: Dora Sims, Scribe. 03/19/2017. 11:21 AM.  History   Chief Complaint Chief Complaint  Patient presents with  . Chest Pain   The history is provided by the patient. No language interpreter was used.    HPI Comments: David Black is a 56 y.o. male with PMHx including CAD, DM, HTN, and HLD who presents to the Emergency Department via EMS complaining of intermittent chest pain that began around 6 AM this morning. He states he awoke with sharp chest pain that resolved after about 10 seconds. Patient went to work and the chest pain presented again around 8 AM. At that time he developed tingling in his left arm and some lightheadedness. A coworker then took patient to see his PCP who subsequently called EMS. Patient received aspirin 324mg  x1 and nitroglycerin x1 en route without immediate improvement. However, he states that his chest pain has improved and describes it as mild and dull currently. Patient has had sporadic, aching pain directly between his shoulder blades for about one month but notes that his current chest pain is not shooting into his back. Patient has a prior history of chest pain and has had two cardiac catheterizations, most recently in 2011. The chest pain that he was experiencing around the time of his catheterizations is dissimilar to his current onset. No recent surgeries or immobilizations. He has some unspecified FMHx of cardiac problems. Patient denies leg swelling, dyspnea, nausea, vomiting, or any other associated symptoms.  Past Medical History:  Diagnosis Date  . CAD (coronary artery disease)    Minimal plaque on catheterization Nov 2011; EF normal by history  . Chest  discomfort 07/18/2010   With exertion  . Depression with anxiety   . Diabetes mellitus without complication ([place])   . Dyslipidemia    x years  . GERD (gastroesophageal reflux disease)   . Hyperlipidemia   . Hypertension    x 10 years  . Obesity, unspecified   . Smoker    x 20 years, 1 1/2 ppd, quit 7 years ago  . SOB (shortness of breath) 07/18/2010    Patient Active Problem List   Diagnosis Date Noted  . Nocturnal hypoxia 09/14/2013  . Diabetes mellitus without complication ([place])   . NSAID long-term use 09/12/2013  . Heme positive stool 09/12/2013  . Extrinsic asthma 08/03/2010  . Essential hypertension 07/18/2010  . Hyperlipemia 12/12/2007  . Obesity 12/12/2007  . DEPRESSION/ANXIETY 12/12/2007  . Coronary atherosclerosis 12/12/2007  . GERD 12/12/2007    Past Surgical History:  Procedure Laterality Date  . CHOLECYSTECTOMY    . FINGER SURGERY     right hand,ring finger  . HERNIA REPAIR    . VENTRAL HERNIA REPAIR  06/2010   x3       Home Medications    Prior to Admission medications   Medication Sig Start Date End Date Taking? Authorizing Provider  aspirin 81 MG tablet Take 81 mg by mouth daily.     Yes [provider]  cyclobenzaprine (FLEXERIL) 10 MG tablet Take 1 tablet (10 mg total) by mouth 3 (three) times daily as needed for muscle spasms. 04/03/16  Yes Timmothy Euler, MD  fenofibrate 54 MG tablet TAKE 1 TABLET ONCE DAILY 02/11/17  Yes  Thomasene Mohair L, PA-C  gabapentin (NEURONTIN) 300 MG capsule TAKE (1) CAPSULE THREE TIMES DAILY. 03/13/17  Yes Stacks, Cletus Gash, MD  INVOKANA 300 MG TABS tablet Take 1 tablet (300 mg total) by mouth daily before breakfast. 03/13/17  Yes Claretta Fraise, MD  meloxicam (MOBIC) 15 MG tablet TAKE 1 TABLET DAILY 03/13/17  Yes Claretta Fraise, MD  OXYGEN Inhale 2 L/min into the lungs Nightly.   Yes [provider]  pantoprazole (PROTONIX) 40 MG tablet TAKE 1 TABLET DAILY 02/11/17  Yes Thomasene Mohair L, PA-C    valsartan-hydrochlorothiazide (DIOVAN-HCT) 320-25 MG tablet TAKE 1 TABLET DAILY Patient taking differently: TAKE 1/2 TABLET DAILY 05/31/16  Yes Stacks, Cletus Gash, MD  Vitamin D, Ergocalciferol, (DRISDOL) 50000 units CAPS capsule Take 1 capsule (50,000 Units total) by mouth every 7 (seven) days. 06/25/16  Yes Claretta Fraise, MD  BAYER CONTOUR NEXT TEST test strip Use as instructed 12/13/16   Claretta Fraise, MD  [place] LANCETS 01S [place] CHECK BLOOD SUGAR 2 TIMES A DAY 10/31/16   Claretta Fraise, MD    Family History Family History  Problem Relation Age of Onset  . Heart disease Father   . Hyperlipidemia Father   . Hypertension Father   . Kidney disease Father   . Testicular cancer Paternal Uncle   . Heart disease Paternal Uncle   . Diabetes Mother   . Hyperlipidemia Mother   . Hypertension Mother   . Breast cancer Paternal Aunt   . Coronary artery disease Other     Social History Social History  Substance Use Topics  . Smoking status: Former Smoker    Packs/day: 1.50    Years: 20.00    Types: Cigarettes    Quit date: 08/20/2002  . Smokeless tobacco: Never Used  . Alcohol use No     Allergies   Advicor [niacin-lovastatin er]; Altace [ramipril]; Bextra [valdecoxib]; Bupropion hcl; Janumet [sitagliptin-metformin hcl]; Niacin; Rofecoxib; Sulfamethoxazole-trimethoprim; and Sulfonamide derivatives   Review of Systems Review of Systems  All other systems reviewed and are negative.  Physical Exam Updated Vital Signs There were no vitals taken for this visit.  Physical Exam  Constitutional: He appears well-developed and well-nourished.  HENT:  Head: Normocephalic.  Right Ear: External ear normal.  Left Ear: External ear normal.  Nose: Nose normal.  Eyes: Conjunctivae are normal. Right eye exhibits no discharge. Left eye exhibits no discharge.  Neck: Normal range of motion.  Cardiovascular: Normal rate, regular rhythm and normal heart sounds.   No murmur  heard. Pulmonary/Chest: Effort normal and breath sounds normal. No respiratory distress. He has no wheezes. He has no rales.  Abdominal: Soft. There is no tenderness. There is no rebound and no guarding.  Musculoskeletal: Normal range of motion. He exhibits no edema or tenderness.  Neurological: He is alert. No cranial nerve deficit. Coordination normal.  Skin: Skin is warm and dry. No rash noted. No erythema. No pallor.  Psychiatric: He has a normal mood and affect. His behavior is normal.  Nursing note and vitals reviewed.  ED Treatments / Results  Labs (all labs ordered are listed, but only abnormal results are displayed) Labs Reviewed  BASIC METABOLIC PANEL - Abnormal; Notable for the following:       Result Value   BUN 24 (*)    All other components within normal limits  CBC WITH DIFFERENTIAL/PLATELET  TROPONIN I  TROPONIN I    EKG  EKG Interpretation None       Radiology No results found.  Procedures  Procedures (including critical care time)  COORDINATION OF CARE: 11:21 AM Discussed treatment plan with pt at bedside and pt agreed to plan.  Medications Ordered in ED Medications  sodium chloride 0.9 % bolus 1,000 mL (not administered)     Initial Impression / Assessment and Plan / ED Course  I have reviewed the triage vital signs and the nursing notes.  Pertinent labs & imaging results that were available during my care of the patient were reviewed by me and considered in my medical decision making (see chart for details).     Final Clinical Impressions(s) / ED Diagnoses   Final diagnoses:  Chest pain, unspecified type    New Prescriptions Discharge Medication List as of 03/19/2017  4:01 PM     I personally preformed the services scribed in my presence. The recorded information has been reviewed is accurate. Virgel Manifold, MD.    Virgel Manifold, MD 04/04/17 [phone]

## 2017-03-19 NOTE — ED Notes (Signed)
ED Provider at bedside. 

## 2017-03-19 NOTE — ED Notes (Signed)
CT contacted for possible time frame of when patient will go to CT, Ki m in CT advised pt is next to come over for his scan.

## 2017-03-19 NOTE — ED Notes (Signed)
Patient transported to CT 

## 2017-03-19 NOTE — Progress Notes (Signed)
BP 116/75 (BP Location: Left Arm, Patient Position: Sitting, Cuff Size: Large)   Pulse 69   Temp (!) 97.3 F (36.3 C) (Oral)   SpO2 96%    Subjective:    Patient ID: David Black, male    DOB: 11/19/1960, 56 y.o.   MRN: [id number]  HPI: David Black is a 56 y.o. male presenting on 03/19/2017 for Chest Pain (started this AM)  Patient with known CAD, hypertension, diabetes, family history of early cardiac events, comes in today with an episode of chest pain while sitting at his desk. He reports that it was a 5 out of 10 and sharp in nature and just behind the sternum. He states he had some radiation up to the neck and tingling in his left arm. He felt somewhat sweaty when the event was happening. He reported to our office and it has resolved since being here. EKG was normal compared to previous EKGs on file. There is enough concern of this being an unstable angina event with that happening at rest that we are can have him go to the emergency room for further workup.  Relevant past medical, surgical, family and social history reviewed and updated as indicated. Allergies and medications reviewed and updated.  Past Medical History:  Diagnosis Date  . CAD (coronary artery disease)    Minimal plaque on catheterization Nov 2011; EF normal by history  . Chest discomfort 07/18/2010   With exertion  . Depression with anxiety   . Diabetes mellitus without complication ([place])   . Dyslipidemia    x years  . GERD (gastroesophageal reflux disease)   . Hyperlipidemia   . Hypertension    x 10 years  . Obesity, unspecified   . Smoker    x 20 years, 1 1/2 ppd, quit 7 years ago  . SOB (shortness of breath) 07/18/2010    Past Surgical History:  Procedure Laterality Date  . CHOLECYSTECTOMY    . FINGER SURGERY     right hand,ring finger  . HERNIA REPAIR    . VENTRAL HERNIA REPAIR  06/2010   x3    Review of Systems  Constitutional: Negative for appetite change and fatigue.  HENT:  Negative.   Eyes: Negative.  Negative for pain and visual disturbance.  Respiratory: Positive for chest tightness. Negative for cough, shortness of breath, wheezing and stridor.   Cardiovascular: Positive for chest pain. Negative for palpitations and leg swelling.  Gastrointestinal: Negative.  Negative for abdominal pain, diarrhea, nausea and vomiting.  Endocrine: Negative.   Genitourinary: Negative.   Musculoskeletal: Negative.   Skin: Negative.  Negative for color change and rash.  Neurological: Positive for weakness. Negative for syncope, numbness and headaches.  Psychiatric/Behavioral: Negative.     Allergies as of 03/19/2017      Reactions   Advicor [niacin-lovastatin Er] Other (See Comments)   unknown   Altace [ramipril] Other (See Comments)   cough   Bextra [valdecoxib] Swelling   Bupropion Hcl Other (See Comments)   Unknown   Janumet [sitagliptin-metformin Hcl] Other (See Comments)   unknown   Niacin Other (See Comments)   unknown   Rofecoxib Swelling   Sulfamethoxazole-trimethoprim Other (See Comments)   unknown   Sulfonamide Derivatives Other (See Comments)   unknown      Medication List       Accurate as of 03/19/17 11:20 AM. Always use your most recent med list.          aspirin 81 MG tablet Take  81 mg by mouth daily.   BAYER CONTOUR NEXT TEST test strip Generic drug:  glucose blood Use as instructed   cyclobenzaprine 10 MG tablet Commonly known as:  FLEXERIL Take 1 tablet (10 mg total) by mouth 3 (three) times daily as needed for muscle spasms.   fenofibrate 54 MG tablet TAKE 1 TABLET ONCE DAILY   gabapentin 300 MG capsule Commonly known as:  NEURONTIN TAKE (1) CAPSULE THREE TIMES DAILY.   INVOKANA 300 MG Tabs tablet Generic drug:  canagliflozin Take 1 tablet (300 mg total) by mouth daily before breakfast.   meloxicam 15 MG tablet Commonly known as:  MOBIC TAKE 1 TABLET DAILY   ONETOUCH DELICA LANCETS 70J Misc CHECK BLOOD SUGAR 2 TIMES A  DAY   OXYGEN Inhale 2 L/min into the lungs Nightly.   pantoprazole 40 MG tablet Commonly known as:  PROTONIX TAKE 1 TABLET DAILY   valsartan-hydrochlorothiazide 320-25 MG tablet Commonly known as:  DIOVAN-HCT TAKE 1 TABLET DAILY   Vitamin D (Ergocalciferol) 50000 units Caps capsule Commonly known as:  DRISDOL Take 1 capsule (50,000 Units total) by mouth every 7 (seven) days.          Objective:    BP 116/75 (BP Location: Left Arm, Patient Position: Sitting, Cuff Size: Large)   Pulse 69   Temp (!) 97.3 F (36.3 C) (Oral)   SpO2 96%   Allergies  Allergen Reactions  . Advicor [Niacin-Lovastatin Er] Other (See Comments)    unknown  . Altace [Ramipril] Other (See Comments)    cough  . Bextra [Valdecoxib] Swelling  . Bupropion Hcl Other (See Comments)    Unknown  . Janumet [Sitagliptin-Metformin Hcl] Other (See Comments)    unknown  . Niacin Other (See Comments)    unknown  . Rofecoxib Swelling  . Sulfamethoxazole-Trimethoprim Other (See Comments)    unknown  . Sulfonamide Derivatives Other (See Comments)    unknown    Physical Exam  Constitutional: He appears well-developed and well-nourished.  HENT:  Head: Normocephalic and atraumatic.  Eyes: Pupils are equal, round, and reactive to light. Conjunctivae and EOM are normal.  Neck: Normal range of motion. Neck supple.  Cardiovascular: Normal rate, regular rhythm and normal heart sounds.   Pulmonary/Chest: Effort normal and breath sounds normal.  Abdominal: Soft. Bowel sounds are normal.  Musculoskeletal: Normal range of motion.  Skin: Skin is warm. He is diaphoretic.  Nursing note and vitals reviewed.   Results for orders placed or performed in visit on 08/28/16  Bayer DCA Hb A1c Waived  Result Value Ref Range   Bayer DCA Hb A1c Waived 6.2 <7.0 %  CBC with Differential/Platelet  Result Value Ref Range   WBC 7.0 3.4 - 10.8 x10E3/uL   RBC 5.38 4.14 - 5.80 x10E6/uL   Hemoglobin 17.0 13.0 - 17.7 g/dL    Hematocrit 48.0 37.5 - 51.0 %   MCV 89 79 - 97 fL   MCH 31.6 26.6 - 33.0 pg   MCHC 35.4 31.5 - 35.7 g/dL   RDW 13.7 12.3 - 15.4 %   Platelets 220 150 - 379 x10E3/uL   Neutrophils 60 Not Estab. %   Lymphs 27 Not Estab. %   Monocytes 8 Not Estab. %   Eos 3 Not Estab. %   Basos 1 Not Estab. %   Neutrophils Absolute 4.3 1.4 - 7.0 x10E3/uL   Lymphocytes Absolute 1.9 0.7 - 3.1 x10E3/uL   Monocytes Absolute 0.5 0.1 - 0.9 x10E3/uL   EOS (ABSOLUTE) 0.2 0.0 -  0.4 x10E3/uL   Basophils Absolute 0.1 0.0 - 0.2 x10E3/uL   Immature Granulocytes 1 Not Estab. %   Immature Grans (Abs) 0.0 0.0 - 0.1 x10E3/uL  CMP14+EGFR  Result Value Ref Range   Glucose 100 (H) 65 - 99 mg/dL   BUN 19 6 - 24 mg/dL   Creatinine, Ser 1.35 (H) 0.76 - 1.27 mg/dL   GFR calc non Af Amer 60 >59 mL/min/1.73   GFR calc Af Amer 69 >59 mL/min/1.73   BUN/Creatinine Ratio 14 9 - 20   Sodium 141 134 - 144 mmol/L   Potassium 4.1 3.5 - 5.2 mmol/L   Chloride 100 96 - 106 mmol/L   CO2 24 18 - 29 mmol/L   Calcium 10.1 8.7 - 10.2 mg/dL   Total Protein 7.8 6.0 - 8.5 g/dL   Albumin 4.8 3.5 - 5.5 g/dL   Globulin, Total 3.0 1.5 - 4.5 g/dL   Albumin/Globulin Ratio 1.6 1.2 - 2.2   Bilirubin Total 0.5 0.0 - 1.2 mg/dL   Alkaline Phosphatase 44 39 - 117 IU/L   AST 28 0 - 40 IU/L   ALT 41 0 - 44 IU/L  Microalbumin / creatinine urine ratio  Result Value Ref Range   Creatinine, Urine 97.1 Not Estab. mg/dL   Albumin, Urine 4.7 Not Estab. ug/mL   Microalb/Creat Ratio 4.8 0.0 - 30.0 mg/g creat  Urinalysis  Result Value Ref Range   Specific Gravity, UA 1.015 1.005 - 1.030   pH, UA 5.5 5.0 - 7.5   Color, UA Yellow Yellow   Appearance Ur Clear Clear   Leukocytes, UA Negative Negative   Protein, UA Negative Negative/Trace   Glucose, UA 3+ (A) Negative   Ketones, UA Negative Negative   RBC, UA Negative Negative   Bilirubin, UA Negative Negative   Urobilinogen, Ur 0.2 0.2 - 1.0 mg/dL   Nitrite, UA Negative Negative  Lipid panel  Result  Value Ref Range   Cholesterol, Total 158 100 - 199 mg/dL   Triglycerides 158 (H) 0 - 149 mg/dL   HDL 28 (L) >39 mg/dL   VLDL Cholesterol Cal 32 5 - 40 mg/dL   LDL Calculated 98 0 - 99 mg/dL   Chol/HDL Ratio 5.6 (H) 0.0 - 5.0 ratio units  PSA Total (Reflex To Free)  Result Value Ref Range   Prostate Specific Ag, Serum 0.9 0.0 - 4.0 ng/mL   Reflex Criteria Comment       Assessment & Plan:   1. Chest pain in adult - EKG 12-Lead  2. Essential hypertension  3. Atherosclerosis of coronary artery of native heart with stable angina pectoris, unspecified vessel or lesion type ([place])  4. Angina pectoris, unstable ([place]) EMS to deliver him to [facility] ER  No current facility-administered medications for this visit.   Current Outpatient Prescriptions:  .  aspirin 81 MG tablet, Take 81 mg by mouth daily.  , Disp: , Rfl:  .  BAYER CONTOUR NEXT TEST test strip, Use as instructed, Disp: 100 each, Rfl: 2 .  fenofibrate 54 MG tablet, TAKE 1 TABLET ONCE DAILY, Disp: 30 tablet, Rfl: 5 .  gabapentin (NEURONTIN) 300 MG capsule, TAKE (1) CAPSULE THREE TIMES DAILY., Disp: 90 capsule, Rfl: 1 .  INVOKANA 300 MG TABS tablet, Take 1 tablet (300 mg total) by mouth daily before breakfast., Disp: 90 tablet, Rfl: 0 .  meloxicam (MOBIC) 15 MG tablet, TAKE 1 TABLET DAILY, Disp: 30 tablet, Rfl: 1 .  ONETOUCH DELICA LANCETS 92E MISC, CHECK BLOOD  SUGAR 2 TIMES A DAY, Disp: 100 each, Rfl: 1 .  pantoprazole (PROTONIX) 40 MG tablet, TAKE 1 TABLET DAILY, Disp: 30 tablet, Rfl: 5 .  valsartan-hydrochlorothiazide (DIOVAN-HCT) 320-25 MG tablet, TAKE 1 TABLET DAILY (Patient taking differently: TAKE 1/2 TABLET DAILY), Disp: 30 tablet, Rfl: 4 .  Vitamin D, Ergocalciferol, (DRISDOL) 50000 units CAPS capsule, Take 1 capsule (50,000 Units total) by mouth every 7 (seven) days., Disp: 12 capsule, Rfl: 3 .  cyclobenzaprine (FLEXERIL) 10 MG tablet, Take 1 tablet (10 mg total) by mouth 3 (three) times daily as needed for muscle  spasms., Disp: 30 tablet, Rfl: 0 .  OXYGEN, Inhale 2 L/min into the lungs Nightly., Disp: , Rfl:   Facility-Administered Medications Ordered in Other Visits:  .  sodium chloride 0.9 % bolus 1,000 mL, 1,000 mL, Intravenous, Once, Virgel Manifold, MD  Continue all other maintenance medications as listed above.  Follow up plan: No Follow-up on file.  Educational handout given for none  Terald Sleeper PA-C [place] [address]  [place], [place] [address] [phone]   03/19/2017, 11:20 AM

## 2017-04-16 ENCOUNTER — Encounter: Payer: Self-pay | Admitting: Family Medicine

## 2017-04-16 ENCOUNTER — Ambulatory Visit (INDEPENDENT_AMBULATORY_CARE_PROVIDER_SITE_OTHER): Payer: BLUE CROSS/BLUE SHIELD | Admitting: Family Medicine

## 2017-04-16 VITALS — BP 128/81 | HR 64 | Temp 97.5°F | Ht 68.0 in | Wt 237.2 lb

## 2017-04-16 DIAGNOSIS — Z791 Long term (current) use of non-steroidal anti-inflammatories (NSAID): Secondary | ICD-10-CM

## 2017-04-16 DIAGNOSIS — E119 Type 2 diabetes mellitus without complications: Secondary | ICD-10-CM | POA: Diagnosis not present

## 2017-04-16 DIAGNOSIS — I10 Essential (primary) hypertension: Secondary | ICD-10-CM | POA: Diagnosis not present

## 2017-04-16 LAB — BAYER DCA HB A1C WAIVED: HB A1C (BAYER DCA - WAIVED): 6.1 % (ref <7.0)

## 2017-04-16 MED ORDER — OLMESARTAN MEDOXOMIL-HCTZ 20-12.5 MG PO TABS: 1.0000 | ORAL_TABLET | Freq: Every day | ORAL | 3 refills | Status: DC

## 2017-04-16 NOTE — Progress Notes (Signed)
   HPI  Patient presents today here to follow-up for diabetes and other medical conditions.  Type 2 diabetes Good med compliance Average fasting blood sugar is 100-130. He's been watching his diet very carefully and states that he's lost about 60 pounds over the last year.  Hypertension Patient would like to change from the losartan due to the recall. Recently had cough with ramipril. Tolerated valsartan and/HCTZ well. He's been using one half tablet so we will reduce the dose today.  Long-term NSAID use Patient states that he is unsure why he is taking meloxicam, he never misses a pill. He also takes Protonix  PMH: Smoking status noted ROS: Per HPI  Objective: BP 128/81   Pulse 64   Temp (!) 97.5 F (36.4 C) (Oral)   Ht '5\' 8"'$  (1.727 m)   Wt 237 lb 3.2 oz (107.6 kg)   BMI 36.07 kg/m  Gen: NAD, alert, cooperative with exam HEENT: NCAT, EOMI, PERRL CV: RRR, good S1/S2, no murmur Resp: CTABL, no wheezes, non-labored Abd: SNTND, BS present, no guarding or organomegaly Ext: No edema, warm Neuro: Alert and oriented, No gross deficits  Diabetic Foot Exam - Simple   Simple Foot Form Diabetic Foot exam was performed with the following findings:  Yes 04/16/2017  5:21 PM  Visual Inspection No deformities, no ulcerations, no other skin breakdown bilaterally:  Yes Sensation Testing Intact to touch and monofilament testing bilaterally:  Yes Pulse Check Posterior Tibialis and Dorsalis pulse intact bilaterally:  Yes Comments Toenail abnormalities with slightly darkened toenail on the left second toe     Assessment and plan:  # T2DM Well-controlled No changes Graduated on weight loss  # Hypertension Changing from valsartan to olmesartan due to the recall. Well-controlled  # Long-term NSAID use Patient does not know what pain he is treating him to continue PPI Trial off of meloxicam Reduce in severity to 1 Aleve twice a day if possible, or even when necessary  use   Orders Placed This Encounter  Procedures  . Bayer DCA Hb A1c Waived  . CMP14+EGFR    Meds ordered this encounter  Medications  . olmesartan-hydrochlorothiazide (BENICAR HCT) 20-12.5 MG tablet    Sig: Take 1 tablet by mouth daily.    Dispense:  90 tablet    Refill:  [place], MD [place] 04/16/2017, 5:40 PM

## 2017-04-16 NOTE — Patient Instructions (Signed)
Great to meet you!  Come back in 4 months unless you need [place] sooner. For a physical exam.   Great job with your diabetes, keep up the good work!

## 2017-04-17 LAB — CMP14+EGFR
ALBUMIN: 4.3 g/dL (ref 3.5–5.5)
ALK PHOS: 33 IU/L — AB (ref 39–117)
ALT: 20 IU/L (ref 0–44)
AST: 23 IU/L (ref 0–40)
Albumin/Globulin Ratio: 1.5 (ref 1.2–2.2)
BUN / CREAT RATIO: 15 (ref 9–20)
BUN: 20 mg/dL (ref 6–24)
Bilirubin Total: 0.4 mg/dL (ref 0.0–1.2)
CHLORIDE: 101 mmol/L (ref 96–106)
CO2: 25 mmol/L (ref 20–29)
Calcium: 10.1 mg/dL (ref 8.7–10.2)
Creatinine, Ser: 1.35 mg/dL — ABNORMAL HIGH (ref 0.76–1.27)
GFR calc Af Amer: 68 mL/min/{1.73_m2} (ref >59)
GFR calc non Af Amer: 59 mL/min/{1.73_m2} — ABNORMAL LOW (ref >59)
GLOBULIN, TOTAL: 2.8 g/dL (ref 1.5–4.5)
Glucose: 98 mg/dL (ref 65–99)
POTASSIUM: 3.9 mmol/L (ref 3.5–5.2)
SODIUM: 140 mmol/L (ref 134–144)
Total Protein: 7.1 g/dL (ref 6.0–8.5)

## 2017-05-14 ENCOUNTER — Other Ambulatory Visit: Payer: Self-pay | Admitting: Family Medicine

## 2017-06-05 DIAGNOSIS — Z23 Encounter for immunization: Secondary | ICD-10-CM | POA: Diagnosis not present

## 2017-06-13 ENCOUNTER — Other Ambulatory Visit: Payer: Self-pay | Admitting: Family Medicine

## 2017-06-13 NOTE — Telephone Encounter (Signed)
Last Vit D 07/26/15  24.9

## 2017-06-14 ENCOUNTER — Other Ambulatory Visit: Payer: Self-pay | Admitting: Family Medicine

## 2017-07-13 ENCOUNTER — Other Ambulatory Visit: Payer: Self-pay | Admitting: Family Medicine

## 2017-08-12 ENCOUNTER — Other Ambulatory Visit: Payer: Self-pay | Admitting: Family Medicine

## 2017-08-12 ENCOUNTER — Other Ambulatory Visit: Payer: Self-pay | Admitting: Physician Assistant

## 2017-08-14 ENCOUNTER — Other Ambulatory Visit: Payer: Self-pay | Admitting: Family Medicine

## 2017-08-14 NOTE — Telephone Encounter (Signed)
Last seen 04/16/17  Dr Wendi Snipes

## 2017-09-11 ENCOUNTER — Other Ambulatory Visit: Payer: Self-pay | Admitting: Physician Assistant

## 2017-09-11 ENCOUNTER — Other Ambulatory Visit: Payer: Self-pay | Admitting: Family Medicine

## 2017-09-11 NOTE — Telephone Encounter (Signed)
Last seen 04/16/17  Dr Wendi Snipes  Last Vit D 07/26/15  24.9

## 2017-09-11 NOTE — Telephone Encounter (Signed)
Last seen 04/16/17  Dr Wendi Snipes last lipid 08/28/16

## 2017-10-01 ENCOUNTER — Encounter: Payer: Self-pay | Admitting: Family Medicine

## 2017-10-01 ENCOUNTER — Ambulatory Visit (INDEPENDENT_AMBULATORY_CARE_PROVIDER_SITE_OTHER): Payer: BLUE CROSS/BLUE SHIELD | Admitting: Family Medicine

## 2017-10-01 VITALS — BP 116/77 | HR 68 | Temp 97.5°F | Ht 68.0 in | Wt 241.0 lb

## 2017-10-01 DIAGNOSIS — E119 Type 2 diabetes mellitus without complications: Secondary | ICD-10-CM

## 2017-10-01 DIAGNOSIS — L570 Actinic keratosis: Secondary | ICD-10-CM | POA: Diagnosis not present

## 2017-10-01 DIAGNOSIS — Z0000 Encounter for general adult medical examination without abnormal findings: Secondary | ICD-10-CM | POA: Diagnosis not present

## 2017-10-01 LAB — BAYER DCA HB A1C WAIVED: HB A1C (BAYER DCA - WAIVED): 5.9 % (ref <7.0)

## 2017-10-01 NOTE — Patient Instructions (Signed)
Great to see you!   Health Maintenance, Male A healthy lifestyle and preventive care is important for your health and wellness. Ask your health care provider about what schedule of regular examinations is right for you. What should I know about weight and diet? Eat a Healthy Diet  Eat plenty of vegetables, fruits, whole grains, low-fat dairy products, and lean protein.  Do not eat a lot of foods high in solid fats, added sugars, or salt.  Maintain a Healthy Weight Regular exercise can help you achieve or maintain a healthy weight. You should:  Do at least 150 minutes of exercise each week. The exercise should increase your heart rate and make you sweat (moderate-intensity exercise).  Do strength-training exercises at least twice a week.  Watch Your Levels of Cholesterol and Blood Lipids  Have your blood tested for lipids and cholesterol every 5 years starting at 55 years of age. If you are at high risk for heart disease, you should start having your blood tested when you are 55 years old. You may need to have your cholesterol levels checked more often if: ? Your lipid or cholesterol levels are high. ? You are older than 55 years of age. ? You are at high risk for heart disease.  What should I know about cancer screening? Many types of cancers can be detected early and may often be prevented. Lung Cancer  You should be screened every year for lung cancer if: ? You are a current smoker who has smoked for at least 30 years. ? You are a former smoker who has quit within the past 15 years.  Talk to your health care provider about your screening options, when you should start screening, and how often you should be screened.  Colorectal Cancer  Routine colorectal cancer screening usually begins at 55 years of age and should be repeated every 5-10 years until you are 55 years old. You may need to be screened more often if early forms of precancerous polyps or small growths are found.  Your health care provider may recommend screening at an earlier age if you have risk factors for colon cancer.  Your health care provider may recommend using home test kits to check for hidden blood in the stool.  A small camera at the end of a tube can be used to examine your colon (sigmoidoscopy or colonoscopy). This checks for the earliest forms of colorectal cancer.  Prostate and Testicular Cancer  Depending on your age and overall health, your health care provider may do certain tests to screen for prostate and testicular cancer.  Talk to your health care provider about any symptoms or concerns you have about testicular or prostate cancer.  Skin Cancer  Check your skin from head to toe regularly.  Tell your health care provider about any new moles or changes in moles, especially if: ? There is a change in a mole's size, shape, or color. ? You have a mole that is larger than a pencil eraser.  Always use sunscreen. Apply sunscreen liberally and repeat throughout the day.  Protect yourself by wearing long sleeves, pants, a wide-brimmed hat, and sunglasses when outside.  What should I know about heart disease, diabetes, and high blood pressure?  If you are 18-39 years of age, have your blood pressure checked every 3-5 years. If you are 40 years of age or older, have your blood pressure checked every year. You should have your blood pressure measured twice-once when you are at a   hospital or clinic, and once when you are not at a hospital or clinic. Record the average of the two measurements. To check your blood pressure when you are not at a hospital or clinic, you can use: ? An automated blood pressure machine at a pharmacy. ? A home blood pressure monitor.  Talk to your health care provider about your target blood pressure.  If you are between 45-79 years old, ask your health care provider if you should take aspirin to prevent heart disease.  Have regular diabetes screenings by  checking your fasting blood sugar level. ? If you are at a normal weight and have a low risk for diabetes, have this test once every three years after the age of 45. ? If you are overweight and have a high risk for diabetes, consider being tested at a younger age or more often.  A one-time screening for abdominal aortic aneurysm (AAA) by ultrasound is recommended for men aged 65-75 years who are current or former smokers. What should I know about preventing infection? Hepatitis B If you have a higher risk for hepatitis B, you should be screened for this virus. Talk with your health care provider to find out if you are at risk for hepatitis B infection. Hepatitis C Blood testing is recommended for:  Everyone born from 1945 through 1965.  Anyone with known risk factors for hepatitis C.  Sexually Transmitted Diseases (STDs)  You should be screened each year for STDs including gonorrhea and chlamydia if: ? You are sexually active and are younger than 55 years of age. ? You are older than 55 years of age and your health care provider tells you that you are at risk for this type of infection. ? Your sexual activity has changed since you were last screened and you are at an increased risk for chlamydia or gonorrhea. Ask your health care provider if you are at risk.  Talk with your health care provider about whether you are at high risk of being infected with HIV. Your health care provider may recommend a prescription medicine to help prevent HIV infection.  What else can I do?  Schedule regular health, dental, and eye exams.  Stay current with your vaccines (immunizations).  Do not use any tobacco products, such as cigarettes, chewing tobacco, and e-cigarettes. If you need help quitting, ask your health care provider.  Limit alcohol intake to no more than 2 drinks per day. One drink equals 12 ounces of beer, 5 ounces of wine, or 1 ounces of hard liquor.  Do not use street drugs.  Do not  share needles.  Ask your health care provider for help if you need support or information about quitting drugs.  Tell your health care provider if you often feel depressed.  Tell your health care provider if you have ever been abused or do not feel safe at home. This information is not intended to replace advice given to you by your health care provider. Make sure you discuss any questions you have with your health care provider. Document Released: 02/02/2008 Document Revised: 04/04/2016 Document Reviewed: 05/10/2015 Elsevier Interactive Patient Education  2018 [employer]  

## 2017-10-01 NOTE — Progress Notes (Signed)
   HPI  Patient presents today for annual physical exam and follow-up chronic medical conditions.  Type 2 diabetes Good medication compliance Average fasting blood sugar is 120-140. No hypoglycemia.  Skin lesion Right ear and right arm both present for about a year, asymptomatic.  Patient is physically active at work, however no formal exercise routine. He watches his diet well.  He did put on some weight at the end of the year due to dietary indiscretion  PMH: Smoking status noted ROS: Per HPI  Objective: BP 116/77   Pulse 68   Temp (!) 97.5 F (36.4 C) (Oral)   Ht 5\' 8"  (1.727 m)   Wt 241 lb (109.3 kg)   BMI 36.64 kg/m  Gen: NAD, alert, cooperative with exam HEENT: NCAT, oropharynx moist and clear, nares clear, PERRLA, EOMI CV: RRR, good S1/S2, no murmur Resp: CTABL, no wheezes, non-labored Abd: SNTND, BS present, no guarding or organomegaly Ext: No edema, warm Neuro: Alert and oriented, No gross deficits Skin Right ear with 2 mm x 4 mm hyperkeratotic lesion, right arm with 4 mm in diameter hyperkeratotic lesion  Assessment and plan:  #Physical exam Normal exam except for weight Patient is making lifestyle changes Discussed colonoscopy, he will consider returning for another colonoscopy  #Diabetes Well-controlled No changes Continue current regimen  #Actinic keratosis Cryotherapy x2 on the right ear and the right forearm. Treated both with 3 freeze thaw cycles     Orders Placed This Encounter  Procedures  . Bayer DCA Hb A1c Waived    No orders of the defined types were placed in this encounter.   Laroy Apple, MD [facility] 10/01/2017, 2:41 PM

## 2017-10-02 LAB — CBC WITH DIFFERENTIAL/PLATELET
BASOS: 1 %
Basophils Absolute: 0.1 10*3/uL (ref 0.0–0.2)
EOS (ABSOLUTE): 0.3 10*3/uL (ref 0.0–0.4)
Eos: 4 %
HEMOGLOBIN: 15.7 g/dL (ref 13.0–17.7)
Hematocrit: 46.5 % (ref 37.5–51.0)
IMMATURE GRANULOCYTES: 0 %
Immature Grans (Abs): 0 10*3/uL (ref 0.0–0.1)
LYMPHS: 30 %
Lymphocytes Absolute: 2.3 10*3/uL (ref 0.7–3.1)
MCH: 30.3 pg (ref 26.6–33.0)
MCHC: 33.8 g/dL (ref 31.5–35.7)
MCV: 90 fL (ref 79–97)
MONOCYTES: 7 %
Monocytes Absolute: 0.5 10*3/uL (ref 0.1–0.9)
NEUTROS PCT: 58 %
Neutrophils Absolute: 4.5 10*3/uL (ref 1.4–7.0)
PLATELETS: 212 10*3/uL (ref 150–379)
RBC: 5.18 x10E6/uL (ref 4.14–5.80)
RDW: 13.8 % (ref 12.3–15.4)
WBC: 7.7 10*3/uL (ref 3.4–10.8)

## 2017-10-02 LAB — CMP14+EGFR
ALT: 21 IU/L (ref 0–44)
AST: 18 IU/L (ref 0–40)
Albumin/Globulin Ratio: 1.5 (ref 1.2–2.2)
Albumin: 4.6 g/dL (ref 3.5–5.5)
Alkaline Phosphatase: 38 IU/L — ABNORMAL LOW (ref 39–117)
BUN/Creatinine Ratio: 17 (ref 9–20)
BUN: 20 mg/dL (ref 6–24)
Bilirubin Total: 0.5 mg/dL (ref 0.0–1.2)
CALCIUM: 10.3 mg/dL — AB (ref 8.7–10.2)
CO2: 24 mmol/L (ref 20–29)
CREATININE: 1.2 mg/dL (ref 0.76–1.27)
Chloride: 102 mmol/L (ref 96–106)
GFR, EST AFRICAN AMERICAN: 79 mL/min/{1.73_m2} (ref >59)
GFR, EST NON AFRICAN AMERICAN: 68 mL/min/{1.73_m2} (ref >59)
GLUCOSE: 78 mg/dL (ref 65–99)
Globulin, Total: 3 g/dL (ref 1.5–4.5)
POTASSIUM: 4 mmol/L (ref 3.5–5.2)
Sodium: 144 mmol/L (ref 134–144)
TOTAL PROTEIN: 7.6 g/dL (ref 6.0–8.5)

## 2017-10-02 LAB — LIPID PANEL
CHOL/HDL RATIO: 5 ratio (ref 0.0–5.0)
Cholesterol, Total: 165 mg/dL (ref 100–199)
HDL: 33 mg/dL — ABNORMAL LOW (ref >39)
LDL CALC: 100 mg/dL — AB (ref 0–99)
Triglycerides: 161 mg/dL — ABNORMAL HIGH (ref 0–149)
VLDL Cholesterol Cal: 32 mg/dL (ref 5–40)

## 2017-10-02 LAB — TSH: TSH: 1.32 u[IU]/mL (ref 0.450–4.500)

## 2017-10-02 LAB — PSA: Prostate Specific Ag, Serum: 1 ng/mL (ref 0.0–4.0)

## 2017-10-05 ENCOUNTER — Other Ambulatory Visit: Payer: Self-pay | Admitting: Physician Assistant

## 2017-10-07 ENCOUNTER — Other Ambulatory Visit: Payer: Self-pay | Admitting: Family Medicine

## 2017-11-12 ENCOUNTER — Other Ambulatory Visit: Payer: Self-pay | Admitting: Family Medicine

## 2017-12-09 ENCOUNTER — Other Ambulatory Visit: Payer: Self-pay | Admitting: Family Medicine

## 2017-12-17 ENCOUNTER — Other Ambulatory Visit: Payer: Self-pay | Admitting: Family Medicine

## 2018-01-14 ENCOUNTER — Telehealth: Payer: Self-pay | Admitting: Family Medicine

## 2018-01-14 ENCOUNTER — Other Ambulatory Visit: Payer: Self-pay | Admitting: Family Medicine

## 2018-01-14 NOTE — Telephone Encounter (Signed)
Spoke with patient's wife and he is drinking Gatorade and eating crackers. Will keep appt in the morning.

## 2018-01-14 NOTE — Telephone Encounter (Signed)
Last seen 10/01/17 

## 2018-01-15 ENCOUNTER — Ambulatory Visit: Payer: BLUE CROSS/BLUE SHIELD | Admitting: Family Medicine

## 2018-01-15 ENCOUNTER — Encounter: Payer: Self-pay | Admitting: Family Medicine

## 2018-01-15 VITALS — BP 96/64 | HR 68 | Temp 97.7°F | Ht 68.0 in | Wt 235.0 lb

## 2018-01-15 DIAGNOSIS — R110 Nausea: Secondary | ICD-10-CM | POA: Diagnosis not present

## 2018-01-15 DIAGNOSIS — R197 Diarrhea, unspecified: Secondary | ICD-10-CM | POA: Diagnosis not present

## 2018-01-15 NOTE — Progress Notes (Signed)
BP 96/64   Pulse 68   Temp 97.7 F (36.5 C) (Oral)   Ht 5' 8"  (1.727 m)   Wt 235 lb (106.6 kg)   BMI 35.73 kg/m    Subjective:    Patient ID: David Black, male    DOB: 1960-10-07, 57 y.o.   MRN: [id number]  HPI: Patient is a 57yrold male who presents to the clinic today with 7-day history of diarrhea, nausea and stomach cramping. Patient ate a fried shrimp platter at [employer] Wednesday afternoon. Shortly after lunch, he began to feel nauseated, but denies throwing up. He experienced stomach cramping and flatulence overnight Wednesday and he reports the first episode of diarrhea occurred early Thursday morning. Since then he has had on average 6-7 episodes of diarrhea per day along with persistent stomach cramping generally across the middle of his abdomen. He improved over this past weekend and went out to eat with his wife on Monday evening to [employer] where he ate a cheeseburger and ice cream. However, the diarrhea worsened early Tuesday morning and he has had nothing but Gatorade and crackers since. In addition to a bland diet, patient began taking Imodium yesterday, which he reports has helped some. Patient reports that his symptoms are worse after eating. He denies hematochezia, fever and vomiting.   Patient mentioned having 3 tick bites within the last 4 weeks, with the most recent one almost 2 weeks ago. However, he denies redness, tenderness or change in appearance of the affected sites.  Relevant past medical, surgical, family history reviewed and updated as indicated. Interim medical history since our last visit reviewed. Allergies and medications reviewed and updated.  Review of Systems  Constitutional: Positive for unexpected weight change.  HENT: Negative.   Eyes: Negative.   Respiratory: Negative.   Cardiovascular: Negative.   Gastrointestinal: Positive for diarrhea and nausea. Negative for blood in stool and vomiting.  Endocrine:  Negative.   Genitourinary: Negative.   Musculoskeletal: Negative.   Skin: Negative.   Allergic/Immunologic: Negative.   Neurological: Negative.   Hematological: Negative.   Psychiatric/Behavioral: Negative.     Per HPI unless specifically indicated above   Allergies as of 01/15/2018      Reactions   Advicor [niacin-lovastatin Er] Other (See Comments)   unknown   Altace [ramipril] Other (See Comments)   cough   Bextra [valdecoxib] Swelling   Bupropion Hcl Other (See Comments)   Unknown   Janumet [sitagliptin-metformin Hcl] Other (See Comments)   unknown   Niacin Other (See Comments)   unknown   Rofecoxib Swelling   Sulfamethoxazole-trimethoprim Other (See Comments)   unknown   Sulfonamide Derivatives Other (See Comments)   unknown      Medication List        Accurate as of 01/15/18  9:37 AM. Always use your most recent med list.          aspirin 81 MG tablet Take 81 mg by mouth daily.   BAYER CONTOUR NEXT TEST test strip Generic drug:  glucose blood Use as instructed   cyclobenzaprine 10 MG tablet Commonly known as:  FLEXERIL Take 1 tablet (10 mg total) by mouth 3 (three) times daily as needed for muscle spasms.   fenofibrate 54 MG tablet TAKE 1 TABLET ONCE DAILY   gabapentin 300 MG capsule Commonly known as:  NEURONTIN TAKE (1) CAPSULE THREE TIMES DAILY.   INVOKANA 300 MG Tabs tablet Generic drug:  canagliflozin Take 1 tablet (300 mg total) by  mouth daily before breakfast.   meloxicam 15 MG tablet Commonly known as:  MOBIC TAKE 1 TABLET DAILY   olmesartan-hydrochlorothiazide 20-12.5 MG tablet Commonly known as:  BENICAR HCT Take 1 tablet by mouth daily.   ONETOUCH DELICA LANCETS 83E Misc CHECK BLOOD SUGAR 2 TIMES A DAY   OXYGEN Inhale 2 L/min into the lungs Nightly.   pantoprazole 40 MG tablet Commonly known as:  PROTONIX TAKE 1 TABLET DAILY   Vitamin D (Ergocalciferol) 50000 units Caps capsule Commonly known as:  DRISDOL TAKE 1 CAPSULE  ONCE A WEEK          Objective:    BP 96/64   Pulse 68   Temp 97.7 F (36.5 C) (Oral)   Ht 5' 8"  (1.727 m)   Wt 235 lb (106.6 kg)   BMI 35.73 kg/m   Wt Readings from Last 3 Encounters:  01/15/18 235 lb (106.6 kg)  10/01/17 241 lb (109.3 kg)  04/16/17 237 lb 3.2 oz (107.6 kg)    Physical Exam  Constitutional: He is oriented to person, place, and time. He appears well-developed and well-nourished. No distress.  HENT:  Head: Normocephalic.  Right Ear: External ear normal.  Left Ear: External ear normal.  Eyes: Pupils are equal, round, and reactive to light. Conjunctivae are normal.  Cardiovascular: Normal rate, regular rhythm and normal heart sounds. Exam reveals no gallop and no friction rub.  No murmur heard. Pulmonary/Chest: Effort normal and breath sounds normal.  Abdominal: Soft. He exhibits no distension. There is no tenderness. There is no guarding.  Musculoskeletal: He exhibits no edema.  Lymphadenopathy:    He has no cervical adenopathy.  Neurological: He is alert and oriented to person, place, and time.  Skin: Skin is warm and dry. Capillary refill takes less than 2 seconds.  Psychiatric: He has a normal mood and affect.         Assessment & Plan:   Problem List Items Addressed This Visit    None    Visit Diagnoses    Diarrhea of presumed infectious origin    -  Primary   Relevant Orders   Stool culture   Cdiff NAA+O+P+Stool Culture   CBC with Differential/Platelet   CMP14+EGFR   Nausea          Patient presents with a 7-day history of diarrhea (6-7x per day) with 10lb weight loss, nausea and abdominal cramping that worsens after eating. The duration of symptoms and history are consistent with some form of infectious diarrhea which will require further work-up to determine definitive diagnosis and treatment.    Follow up plan: I have ordered a CBC and instructed the patient to collect a stool sample and return it to the office for further evaluation.  Until the results of the stool sample are back, I have encouraged him to continue taking Imodium as needed, eat a bland diet and maintain good hydration with water and electrolyte beverages. Return if symptoms worsen or fail to improve.  Darla Lesches, PA-S  Counseling provided for all of the vaccine components Orders Placed This Encounter  Procedures  . Stool culture  . Cdiff NAA+O+P+Stool Culture  . CBC with Differential/Platelet  . CMP14+EGFR   Patient seen and examined with Vita Barley, PA student, agree with assessment and plan above.  Will await stool samples. Caryl Pina, MD [facility] 01/15/2018, 9:37 AM

## 2018-01-16 LAB — CMP14+EGFR
ALK PHOS: 45 IU/L (ref 39–117)
ALT: 44 IU/L (ref 0–44)
AST: 21 IU/L (ref 0–40)
Albumin/Globulin Ratio: 1.6 (ref 1.2–2.2)
Albumin: 4.4 g/dL (ref 3.5–5.5)
BUN/Creatinine Ratio: 12 (ref 9–20)
BUN: 16 mg/dL (ref 6–24)
Bilirubin Total: 0.5 mg/dL (ref 0.0–1.2)
CALCIUM: 9.9 mg/dL (ref 8.7–10.2)
CO2: 22 mmol/L (ref 20–29)
CREATININE: 1.35 mg/dL — AB (ref 0.76–1.27)
Chloride: 103 mmol/L (ref 96–106)
GFR calc Af Amer: 68 mL/min/{1.73_m2} (ref >59)
GFR, EST NON AFRICAN AMERICAN: 59 mL/min/{1.73_m2} — AB (ref >59)
GLUCOSE: 111 mg/dL — AB (ref 65–99)
Globulin, Total: 2.7 g/dL (ref 1.5–4.5)
Potassium: 4.7 mmol/L (ref 3.5–5.2)
Sodium: 143 mmol/L (ref 134–144)
Total Protein: 7.1 g/dL (ref 6.0–8.5)

## 2018-01-16 LAB — CBC WITH DIFFERENTIAL/PLATELET
BASOS ABS: 0.1 10*3/uL (ref 0.0–0.2)
Basos: 1 %
EOS (ABSOLUTE): 0.2 10*3/uL (ref 0.0–0.4)
EOS: 3 %
HEMOGLOBIN: 15.8 g/dL (ref 13.0–17.7)
Hematocrit: 46.5 % (ref 37.5–51.0)
IMMATURE GRANULOCYTES: 0 %
Immature Grans (Abs): 0 10*3/uL (ref 0.0–0.1)
Lymphocytes Absolute: 1.5 10*3/uL (ref 0.7–3.1)
Lymphs: 24 %
MCH: 30.9 pg (ref 26.6–33.0)
MCHC: 34 g/dL (ref 31.5–35.7)
MCV: 91 fL (ref 79–97)
MONOCYTES: 9 %
Monocytes Absolute: 0.6 10*3/uL (ref 0.1–0.9)
NEUTROS PCT: 63 %
Neutrophils Absolute: 3.9 10*3/uL (ref 1.4–7.0)
Platelets: 248 10*3/uL (ref 150–450)
RBC: 5.12 x10E6/uL (ref 4.14–5.80)
RDW: 13.7 % (ref 12.3–15.4)
WBC: 6.2 10*3/uL (ref 3.4–10.8)

## 2018-02-13 ENCOUNTER — Other Ambulatory Visit: Payer: Self-pay | Admitting: Family Medicine

## 2018-02-21 ENCOUNTER — Other Ambulatory Visit: Payer: Self-pay | Admitting: Family Medicine

## 2018-02-21 DIAGNOSIS — M545 Low back pain, unspecified: Secondary | ICD-10-CM

## 2018-03-13 ENCOUNTER — Other Ambulatory Visit: Payer: Self-pay | Admitting: Family Medicine

## 2018-04-15 ENCOUNTER — Other Ambulatory Visit: Payer: Self-pay | Admitting: *Deleted

## 2018-04-15 MED ORDER — OLMESARTAN MEDOXOMIL-HCTZ 20-12.5 MG PO TABS: 1.0000 | ORAL_TABLET | Freq: Every day | ORAL | 0 refills | Status: DC

## 2018-05-09 ENCOUNTER — Ambulatory Visit: Payer: BLUE CROSS/BLUE SHIELD | Admitting: Pediatrics

## 2018-05-09 ENCOUNTER — Encounter: Payer: Self-pay | Admitting: Pediatrics

## 2018-05-09 VITALS — BP 128/83 | HR 71 | Temp 97.8°F | Ht 68.0 in | Wt 251.0 lb

## 2018-05-09 DIAGNOSIS — M545 Low back pain, unspecified: Secondary | ICD-10-CM

## 2018-05-09 DIAGNOSIS — L03031 Cellulitis of right toe: Secondary | ICD-10-CM | POA: Diagnosis not present

## 2018-05-09 MED ORDER — CYCLOBENZAPRINE HCL 10 MG PO TABS: 10.0000 mg | ORAL_TABLET | Freq: Three times a day (TID) | ORAL | 3 refills | Status: DC | PRN

## 2018-05-09 MED ORDER — CEPHALEXIN 500 MG PO CAPS: 500.0000 mg | ORAL_CAPSULE | Freq: Three times a day (TID) | ORAL | 0 refills | Status: DC

## 2018-05-09 NOTE — Progress Notes (Signed)
  Subjective:   Patient ID: David Black, male    DOB: 21-Aug-1960, 57 y.o.   MRN: [id number] CC: Ingrown Toenail (Right Big Toe)  HPI: David Black is a 57 y.o. male   Symptoms started about 2 weeks ago.  He has been trying to soak it off and on.  Has had some discharge from the nail.  Nail is sore.  Right great toenail, lateral edge affected.  He has had an ingrown toenail in the same nail in the same site in the past, had half the toenail removed.  Toenail then grew back.   No fevers, appetite is fine.  He has low back pain off and on Flexeril helps when the pain starts.  Most recently started having pain a couple weeks ago.  Realized his prescription is out of date.  Flexeril does not make him sleepy, he does not have any side effects from it.  He does have some with the pain.  Relevant past medical, surgical, family and social history reviewed. Allergies and medications reviewed and updated. Social History   Tobacco Use  Smoking Status Former Smoker  . Packs/day: 1.50  . Years: 20.00  . Pack years: 30.00  . Types: Cigarettes  . Last attempt to quit: 08/20/2002  . Years since quitting: 15.7  Smokeless Tobacco Never Used   ROS: Per HPI   Objective:    BP 128/83   Pulse 71   Temp 97.8 F (36.6 C) (Oral)   Ht 5\' 8"  (9.924 m)   Wt 251 lb (113.9 kg)   BMI 38.16 kg/m   Wt Readings from Last 3 Encounters:  05/09/18 251 lb (113.9 kg)  01/15/18 235 lb (106.6 kg)  10/01/17 241 lb (109.3 kg)    Gen: NAD, alert, cooperative with exam, NCAT EYES: EOMI, no conjunctival injection, or no icterus ENT:   OP without erythema LYMPH: no cervical LAD CV:  distal pulses 2+ b/l Resp: normal WOB Ext: No edema, warm Neuro: Alert and oriented Skin: Right great toe with redness lateral side and nailbed.  Slightly swollen.  Tender to palpation.  Clear discharge present at lateral side of nail.  Assessment & Plan:  Arville was seen today for ingrown toenail.  Diagnoses and all orders for  this visit:  Cellulitis of toe of right foot If not improving may need to see podiatry for repeat toenail extraction.  Return precautions discussed.  Warm water Epson salt soaks 2-3 times a day -     cephALEXin (KEFLEX) 500 MG capsule; Take 1 capsule (500 mg total) by mouth 3 (three) times daily.  Left-sided low back pain without sciatica Use below as needed.  Any worsening let us know. -     cyclobenzaprine (FLEXERIL) 10 MG tablet; Take 1 tablet (10 mg total) by mouth 3 (three) times daily as needed for muscle spasms.   Follow up plan: Return in about 4 weeks (around 06/06/2018). Assunta Found, MD [place]

## 2018-05-12 ENCOUNTER — Other Ambulatory Visit: Payer: Self-pay | Admitting: Family Medicine

## 2018-05-13 MED ORDER — MELOXICAM 15 MG PO TABS: 15.0000 mg | ORAL_TABLET | Freq: Every day | ORAL | 0 refills | Status: DC

## 2018-05-13 NOTE — Telephone Encounter (Signed)
[id number] 9/24

## 2018-05-28 ENCOUNTER — Ambulatory Visit: Payer: BLUE CROSS/BLUE SHIELD | Admitting: Pediatrics

## 2018-05-30 ENCOUNTER — Ambulatory Visit: Payer: Managed Care, Other (non HMO) | Admitting: Family Medicine

## 2018-05-30 ENCOUNTER — Encounter: Payer: Self-pay | Admitting: Family Medicine

## 2018-05-30 VITALS — BP 121/77 | HR 73 | Temp 97.6°F | Ht 68.0 in | Wt 250.0 lb

## 2018-05-30 DIAGNOSIS — G720 Drug-induced myopathy: Secondary | ICD-10-CM | POA: Insufficient documentation

## 2018-05-30 DIAGNOSIS — E119 Type 2 diabetes mellitus without complications: Secondary | ICD-10-CM

## 2018-05-30 DIAGNOSIS — M1990 Unspecified osteoarthritis, unspecified site: Secondary | ICD-10-CM | POA: Diagnosis not present

## 2018-05-30 DIAGNOSIS — Z23 Encounter for immunization: Secondary | ICD-10-CM

## 2018-05-30 DIAGNOSIS — E1159 Type 2 diabetes mellitus with other circulatory complications: Secondary | ICD-10-CM

## 2018-05-30 DIAGNOSIS — Z791 Long term (current) use of non-steroidal anti-inflammatories (NSAID): Secondary | ICD-10-CM

## 2018-05-30 DIAGNOSIS — I152 Hypertension secondary to endocrine disorders: Secondary | ICD-10-CM

## 2018-05-30 DIAGNOSIS — Z789 Other specified health status: Secondary | ICD-10-CM

## 2018-05-30 DIAGNOSIS — Z1211 Encounter for screening for malignant neoplasm of colon: Secondary | ICD-10-CM

## 2018-05-30 DIAGNOSIS — K219 Gastro-esophageal reflux disease without esophagitis: Secondary | ICD-10-CM

## 2018-05-30 DIAGNOSIS — I10 Essential (primary) hypertension: Secondary | ICD-10-CM

## 2018-05-30 LAB — BAYER DCA HB A1C WAIVED: HB A1C: 6.2 % (ref <7.0)

## 2018-05-30 MED ORDER — PANTOPRAZOLE SODIUM 40 MG PO TBEC: 40.0000 mg | DELAYED_RELEASE_TABLET | Freq: Every day | ORAL | 3 refills | Status: DC

## 2018-05-30 MED ORDER — FENOFIBRATE 54 MG PO TABS: 54.0000 mg | ORAL_TABLET | Freq: Every day | ORAL | 3 refills | Status: DC

## 2018-05-30 MED ORDER — OLMESARTAN MEDOXOMIL-HCTZ 20-12.5 MG PO TABS: 1.0000 | ORAL_TABLET | Freq: Every day | ORAL | 3 refills | Status: DC

## 2018-05-30 MED ORDER — CANAGLIFLOZIN 300 MG PO TABS: 300.0000 mg | ORAL_TABLET | Freq: Every day | ORAL | 3 refills | Status: DC

## 2018-05-30 MED ORDER — GABAPENTIN 300 MG PO CAPS: ORAL_CAPSULE | ORAL | 3 refills | Status: DC

## 2018-05-30 MED ORDER — MELOXICAM 15 MG PO TABS: 15.0000 mg | ORAL_TABLET | Freq: Every day | ORAL | 3 refills | Status: DC

## 2018-05-30 NOTE — Progress Notes (Addendum)
Subjective: CC: DM PCP: Janora Norlander, DO UXL:[id number] M Bewley is a 57 y.o. male presenting to clinic today for:  1. Type 2 Diabetes w/ HTN and HLD  Patient reports he checks fasting glucose once per week.  High at home: 150; Low at home: 120, Taking medication(s): Invokana 300 qam, Fenofibrate and Benicar 20/12.5, Side effects: None.  He reports that he was "a [place] pig" previously and had many intolerances to several medications, including statins.  He has gained a little weight, which he thinks has impacted his blood sugar some.  He is working on this.  Last eye exam: Due.  He has not had a diabetic eye exam in greater than 1 year. Last foot exam: Due Last A1c: 5.9 (09/2017) Nephropathy screen indicated?: on ARB Last flu, zoster and/or pneumovax: Due PNA , Flu  ROS: denies dizziness, LOC, polyuria, polydipsia, unintended weight loss/gain, foot ulcerations, numbness or tingling in extremities or chest pain.  He does have some ingrown toenails, which she manages by himself.  Denies any history of foot ulcerations.  2.  Arthritis/GERD Patient reports various aches and pains that seem to be well-controlled with meloxicam.  He uses this fairly regularly.  He is on Protonix for acid reflux control and reports good control.  Denies any abdominal pain, nausea, vomiting, hematochezia or melena.  No history of stomach ulcers.  He is due for colon cancer screening.  He was supposed to have this done a few years ago but had a fistula and therefore this was canceled.  ROS: Per HPI  Allergies  Allergen Reactions  . Advicor [Niacin-Lovastatin Er] Other (See Comments)    unknown  . Altace [Ramipril] Other (See Comments)    cough  . Bextra [Valdecoxib] Swelling  . Bupropion Hcl Other (See Comments)    Unknown  . Janumet [Sitagliptin-Metformin Hcl] Other (See Comments)    unknown  . Niacin Other (See Comments)    unknown  . Rofecoxib Swelling  . Sulfamethoxazole-Trimethoprim Other (See  Comments)    unknown  . Sulfonamide Derivatives Other (See Comments)    unknown   Past Medical History:  Diagnosis Date  . CAD (coronary artery disease)    Minimal plaque on catheterization Nov 2011; EF normal by history  . Chest discomfort 07/18/2010   With exertion  . Depression with anxiety   . Diabetes mellitus without complication ([place])   . Dyslipidemia    x years  . GERD (gastroesophageal reflux disease)   . Hyperlipidemia   . Hypertension    x 10 years  . Obesity, unspecified   . Smoker    x 20 years, 1 1/2 ppd, quit 7 years ago  . SOB (shortness of breath) 07/18/2010    Current Outpatient Medications:  .  aspirin 81 MG tablet, Take 81 mg by mouth daily.  , Disp: , Rfl:  .  BAYER CONTOUR NEXT TEST test strip, Use as instructed, Disp: 100 each, Rfl: 2 .  cephALEXin (KEFLEX) 500 MG capsule, Take 1 capsule (500 mg total) by mouth 3 (three) times daily., Disp: 21 capsule, Rfl: 0 .  cyclobenzaprine (FLEXERIL) 10 MG tablet, Take 1 tablet (10 mg total) by mouth 3 (three) times daily as needed for muscle spasms., Disp: 30 tablet, Rfl: 3 .  fenofibrate 54 MG tablet, TAKE 1 TABLET ONCE DAILY, Disp: 90 tablet, Rfl: 0 .  gabapentin (NEURONTIN) 300 MG capsule, TAKE (1) CAPSULE THREE TIMES DAILY., Disp: 270 capsule, Rfl: 0 .  INVOKANA 300 MG TABS  tablet, Take 1 tablet (300 mg total) by mouth daily before breakfast., Disp: 90 tablet, Rfl: 0 .  meloxicam (MOBIC) 15 MG tablet, Take 1 tablet (15 mg total) by mouth daily. Needs to be seen for more refills, Disp: 30 tablet, Rfl: 0 .  olmesartan-hydrochlorothiazide (BENICAR HCT) 20-12.5 MG tablet, Take 1 tablet by mouth daily. Needs to be seen for more refills, Disp: 30 tablet, Rfl: 0 .  ONETOUCH DELICA LANCETS 93Y MISC, CHECK BLOOD SUGAR 2 TIMES A DAY, Disp: 100 each, Rfl: 1 .  OXYGEN, Inhale 2 L/min into the lungs Nightly., Disp: , Rfl:  .  pantoprazole (PROTONIX) 40 MG tablet, TAKE 1 TABLET DAILY, Disp: 90 tablet, Rfl: 0 Social History    Socioeconomic History  . Marital status: Married    Spouse name: Not on file  . Number of children: Not on file  . Years of education: Not on file  . Highest education level: Not on file  Occupational History  . Not on file  Social Needs  . Financial resource strain: Not on file  . Food insecurity:    Worry: Not on file    Inability: Not on file  . Transportation needs:    Medical: Not on file    Non-medical: Not on file  Tobacco Use  . Smoking status: Former Smoker    Packs/day: 1.50    Years: 20.00    Pack years: 30.00    Types: Cigarettes    Last attempt to quit: 08/20/2002    Years since quitting: 15.7  . Smokeless tobacco: Never Used  Substance and Sexual Activity  . Alcohol use: No    Alcohol/week: 0.0 standard drinks  . Drug use: No  . Sexual activity: Not on file  Lifestyle  . Physical activity:    Days per week: Not on file    Minutes per session: Not on file  . Stress: Not on file  Relationships  . Social connections:    Talks on phone: Not on file    Gets together: Not on file    Attends religious service: Not on file    Active member of club or organization: Not on file    Attends meetings of clubs or organizations: Not on file    Relationship status: Not on file  . Intimate partner violence:    Fear of current or ex partner: Not on file    Emotionally abused: Not on file    Physically abused: Not on file    Forced sexual activity: Not on file  Other Topics Concern  . Not on file  Social History Narrative   Married with 1 daughter   Family History  Problem Relation Age of Onset  . Heart disease Father   . Hyperlipidemia Father   . Hypertension Father   . Kidney disease Father   . Testicular cancer Paternal Uncle   . Heart disease Paternal Uncle   . Diabetes Mother   . Hyperlipidemia Mother   . Hypertension Mother   . Breast cancer Paternal Aunt   . Coronary artery disease Other     Objective: Office vital signs reviewed. BP 121/77    Pulse 73   Temp 97.6 F (36.4 C) (Oral)   Ht 5\' 8"  (1.727 m)   Wt 250 lb (113.4 kg)   BMI 38.01 kg/m   Physical Examination:  General: Awake, alert, obese, No acute distress HEENT: Normal    Eyes: extraocular membranes intact, sclera white Cardio: regular rate and rhythm, S1S2  heard, no murmurs appreciated Pulm: clear to auscultation bilaterally, no wheezes, rhonchi or rales; normal work of breathing on room air Extremities: warm, well perfused, No edema, cyanosis or clubbing; +2 pulses bilaterally MSK: normal gait and station  Assessment/ Plan: 57 y.o. male   1. Diabetes mellitus without complication (HCC) C1Y is controlled at 6.2.  Continue current diabetes regimen.  I did encourage him to schedule his diabetic eye exam.  We will plan to perform diabetic foot exam at next visit.  I did offer pneumococcal vaccination today given history of diabetes but patient declined this, as his deductible with insurance is high and he does not wish to pay for this at this time.  Influenza vaccine was administered. - Bayer DCA Hb A1c Waived  2. Hypertension associated with diabetes ([place]) Blood pressure at goal with current regimen.  No changes.  Refill sent to pharmacy.  Will check BMP given fluctuating rise in creatinine.  I question as to whether or not patient has underlying CKD, as this dates back many years.  May be related to use of oral NSAID. - Basic Metabolic Panel  3. Morbid obesity (HCC) Diet modification, physical activity  4. Arthritis Using meloxicam for this, which seems to be controlling well.  May be impacting renal function however, will need to monitor this closely.  5. NSAID long-term use As above.  No GI bleed or abdominal symptoms at this time.  Using PPI.  6. Gastroesophageal reflux disease without esophagitis Protonix refilled.  Refer to gastroenterology for colon cancer screening.  7. Screen for colon cancer - Ambulatory referral to Gastroenterology  8. Need for  immunization against influenza - Flu Vaccine QUAD 36+ mos IM  9. Statin intolerance Per patient report of statin intolerance and therefore is only on a fibrate.  Refilled.   Orders Placed This Encounter  Procedures  . Flu Vaccine QUAD 36+ mos IM  . Bayer DCA Hb A1c Waived  . Basic Metabolic Panel  . Ambulatory referral to Gastroenterology    Referral Priority:   Routine    Referral Type:   Consultation    Referral Reason:   Specialty Services Required    Number of Visits Requested:   1   Meds ordered this encounter  Medications  . gabapentin (NEURONTIN) 300 MG capsule    Sig: TAKE (1) CAPSULE THREE TIMES DAILY.    Dispense:  270 capsule    Refill:  3  . canagliflozin (INVOKANA) 300 MG TABS tablet    Sig: Take 1 tablet (300 mg total) by mouth daily before breakfast.    Dispense:  90 tablet    Refill:  3  . meloxicam (MOBIC) 15 MG tablet    Sig: Take 1 tablet (15 mg total) by mouth daily. (if needed for joint pain)    Dispense:  90 tablet    Refill:  3  . olmesartan-hydrochlorothiazide (BENICAR HCT) 20-12.5 MG tablet    Sig: Take 1 tablet by mouth daily.    Dispense:  90 tablet    Refill:  3    $  . pantoprazole (PROTONIX) 40 MG tablet    Sig: Take 1 tablet (40 mg total) by mouth daily.    Dispense:  90 tablet    Refill:  3  . fenofibrate 54 MG tablet    Sig: Take 1 tablet (54 mg total) by mouth daily.    Dispense:  90 tablet    Refill:  3    Follow up in 6 months for  interval DM, HTN, HLD check.  Plan for fasting labs/ lipid panel at next visit. Additionally, per patient's report, no longer requiring supplemental oxygen.   Janora Norlander, DO [place] [phone]

## 2018-05-30 NOTE — Patient Instructions (Signed)
Your A1c 6.2 today  You were given your pneumonia vaccine and flu vaccine today. You will be contacted with results of your blood work once this is available, likely in the next 3 days. Refills of all of your medicines have been sent.  Please follow-up in 6 months for recheck.  Schedule an appointment with Dr. Marin Comment at [facility] for your eyes.  Have her send me a copy of your results.   Diabetes Mellitus and Exercise Exercising regularly is important for your overall health, especially when you have diabetes (diabetes mellitus). Exercising is not only about losing weight. It has many health benefits, such as increasing muscle strength and bone density and reducing body fat and stress. This leads to improved fitness, flexibility, and endurance, all of which result in better overall health. Exercise has additional benefits for people with diabetes, including:  Reducing appetite.  Helping to lower and control blood glucose.  Lowering blood pressure.  Helping to control amounts of fatty substances (lipids) in the blood, such as cholesterol and triglycerides.  Helping the body to respond better to insulin (improving insulin sensitivity).  Reducing how much insulin the body needs.  Decreasing the risk for heart disease by: ? Lowering cholesterol and triglyceride levels. ? Increasing the levels of good cholesterol. ? Lowering blood glucose levels.  What is my activity plan? Your health care provider or certified diabetes educator can help you make a plan for the type and frequency of exercise (activity plan) that works for you. Make sure that you:  Do at least 150 minutes of moderate-intensity or vigorous-intensity exercise each week. This could be brisk walking, biking, or water aerobics. ? Do stretching and strength exercises, such as yoga or weightlifting, at least 2 times a week. ? Spread out your activity over at least 3 days of the week.  Get some form of physical activity every  day. ? Do not go more than 2 days in a row without some kind of physical activity. ? Avoid being inactive for more than 90 minutes at a time. Take frequent breaks to walk or stretch.  Choose a type of exercise or activity that you enjoy, and set realistic goals.  Start slowly, and gradually increase the intensity of your exercise over time.  What do I need to know about managing my diabetes?  Check your blood glucose before and after exercising. ? If your blood glucose is higher than 240 mg/dL (13.3 mmol/L) before you exercise, check your urine for ketones. If you have ketones in your urine, do not exercise until your blood glucose returns to normal.  Know the symptoms of low blood glucose (hypoglycemia) and how to treat it. Your risk for hypoglycemia increases during and after exercise. Common symptoms of hypoglycemia can include: ? Hunger. ? Anxiety. ? Sweating and feeling clammy. ? Confusion. ? Dizziness or feeling light-headed. ? Increased heart rate or palpitations. ? Blurry vision. ? Tingling or numbness around the mouth, lips, or tongue. ? Tremors or shakes. ? Irritability.  Keep a rapid-acting carbohydrate snack available before, during, and after exercise to help prevent or treat hypoglycemia.  Avoid injecting insulin into areas of the body that are going to be exercised. For example, avoid injecting insulin into: ? The arms, when playing tennis. ? The legs, when jogging.  Keep records of your exercise habits. Doing this can help you and your health care provider adjust your diabetes management plan as needed. Write down: ? Food that you eat before and after you exercise. ?  Blood glucose levels before and after you exercise. ? The type and amount of exercise you have done. ? When your insulin is expected to peak, if you use insulin. Avoid exercising at times when your insulin is peaking.  When you start a new exercise or activity, work with your health care provider to  make sure the activity is safe for you, and to adjust your insulin, medicines, or food intake as needed.  Drink plenty of water while you exercise to prevent dehydration or heat stroke. Drink enough fluid to keep your urine clear or pale yellow. This information is not intended to replace advice given to you by your health care provider. Make sure you discuss any questions you have with your health care provider. Document Released: 10/27/2003 Document Revised: 02/24/2016 Document Reviewed: 01/16/2016 Elsevier Interactive Patient Education  2018 [employer].

## 2018-05-31 LAB — BASIC METABOLIC PANEL WITH GFR
BUN/Creatinine Ratio: 13 (ref 9–20)
BUN: 14 mg/dL (ref 6–24)
CO2: 22 mmol/L (ref 20–29)
Calcium: 10.3 mg/dL — ABNORMAL HIGH (ref 8.7–10.2)
Chloride: 101 mmol/L (ref 96–106)
Creatinine, Ser: 1.12 mg/dL (ref 0.76–1.27)
GFR calc Af Amer: 85 mL/min/1.73 (ref >59)
GFR calc non Af Amer: 74 mL/min/1.73 (ref >59)
Glucose: 105 mg/dL — ABNORMAL HIGH (ref 65–99)
Potassium: 4.3 mmol/L (ref 3.5–5.2)
Sodium: 142 mmol/L (ref 134–144)

## 2018-06-02 ENCOUNTER — Telehealth: Payer: Self-pay | Admitting: Pediatrics

## 2018-06-02 NOTE — Telephone Encounter (Signed)
DR Evette Doffing seen for this on 9/20- will forward

## 2018-06-02 NOTE — Telephone Encounter (Signed)
Aware. 

## 2018-06-02 NOTE — Telephone Encounter (Signed)
Will send to Dr. Lajuana Ripple to advise.

## 2018-06-02 NOTE — Telephone Encounter (Signed)
I was unaware that he was on an antibiotic for his toenail.  He mentioned possible ingrown nails at diabetic visit but did not mention infection.  Can you clarify?

## 2018-06-02 NOTE — Telephone Encounter (Signed)
Needs to be seen. Discussed if not healing may need procedure done or to see podiatry.

## 2018-06-03 ENCOUNTER — Encounter: Payer: Self-pay | Admitting: Family Medicine

## 2018-06-03 ENCOUNTER — Ambulatory Visit: Payer: Managed Care, Other (non HMO) | Admitting: Family Medicine

## 2018-06-03 VITALS — BP 131/79 | HR 79 | Temp 98.2°F | Ht 68.0 in | Wt 252.4 lb

## 2018-06-03 DIAGNOSIS — L03031 Cellulitis of right toe: Secondary | ICD-10-CM

## 2018-06-03 MED ORDER — AMOXICILLIN-POT CLAVULANATE 875-125 MG PO TABS: 1.0000 | ORAL_TABLET | Freq: Two times a day (BID) | ORAL | 0 refills | Status: DC

## 2018-06-03 MED ORDER — ACETAMINOPHEN-CODEINE #3 300-30 MG PO TABS: 1.0000 | ORAL_TABLET | ORAL | 0 refills | Status: AC | PRN

## 2018-06-03 NOTE — Progress Notes (Signed)
S:Painful swelling at site, building since treated here 2 weeks ago. Tried to remove with dental floss last night withou success.   O: Right firt toe swollen, red, macerated. Deformed by surgical intervention and infection. Moderate edema.  1. Cellulitis of toe of right foot     Meds ordered this encounter  Medications  . amoxicillin-clavulanate (AUGMENTIN) 875-125 MG tablet    Sig: Take 1 tablet by mouth 2 (two) times daily. Take all of this medication    Dispense:  20 tablet    Refill:  0  . acetaminophen-codeine (TYLENOL #3) 300-30 MG tablet    Sig: Take 1-2 tablets by mouth every 4 (four) hours as needed for up to 5 days for moderate pain.    Dispense:  24 tablet    Refill:  0    No orders of the defined types were placed in this encounter.   Follow up as needed.  Claretta Fraise, MD

## 2018-06-10 ENCOUNTER — Ambulatory Visit: Payer: Managed Care, Other (non HMO) | Admitting: Family Medicine

## 2018-06-10 ENCOUNTER — Ambulatory Visit (AMBULATORY_SURGERY_CENTER): Payer: Self-pay

## 2018-06-10 VITALS — Ht 69.0 in | Wt 259.0 lb

## 2018-06-10 DIAGNOSIS — Z1211 Encounter for screening for malignant neoplasm of colon: Secondary | ICD-10-CM

## 2018-06-10 MED ORDER — NA SULFATE-K SULFATE-MG SULF 17.5-3.13-1.6 GM/177ML PO SOLN: 1.0000 | Freq: Once | ORAL | 0 refills | Status: AC

## 2018-06-10 NOTE — Progress Notes (Signed)
Denies allergies to eggs or soy products. Denies complication of anesthesia or sedation. Denies use of weight loss medication. Denies use of O2.   Emmi instructions declined.   A 15.00 coupon for Suprep was given to the patient.  

## 2018-06-13 ENCOUNTER — Other Ambulatory Visit: Payer: Self-pay | Admitting: Family Medicine

## 2018-06-13 ENCOUNTER — Telehealth: Payer: Self-pay | Admitting: Family Medicine

## 2018-06-13 DIAGNOSIS — L600 Ingrowing nail: Secondary | ICD-10-CM

## 2018-06-13 MED ORDER — CEPHALEXIN 500 MG PO CAPS: 500.0000 mg | ORAL_CAPSULE | Freq: Four times a day (QID) | ORAL | 0 refills | Status: AC

## 2018-06-13 NOTE — Telephone Encounter (Signed)
Patient seen on 06/03/2018 and was given Augmentin by Dr. Livia Snellen.  Patient states that he still has infection in toe nail that is no better or worse and would like to have Keflex sent to pharmacy

## 2018-06-13 NOTE — Telephone Encounter (Signed)
Patient aware.

## 2018-06-13 NOTE — Telephone Encounter (Signed)
Sent to pharmacy.  Referral to podiatry has also been placed for toenail removal given ongoing issue with nail.

## 2018-06-17 ENCOUNTER — Telehealth: Payer: Self-pay

## 2018-06-17 ENCOUNTER — Other Ambulatory Visit: Payer: Self-pay | Admitting: Physician Assistant

## 2018-06-17 MED ORDER — EMPAGLIFLOZIN 25 MG PO TABS: 25.0000 mg | ORAL_TABLET | Freq: Every day | ORAL | 5 refills | Status: DC

## 2018-06-17 NOTE — Telephone Encounter (Signed)
Jardiance 25 mg 1 daily has been sent to the pharmacy.

## 2018-06-17 NOTE — Telephone Encounter (Signed)
Dr Lajuana Ripple patient  Been on Invokana for years with good results but insurance changed so prior auth denied  Needs to try [place] and Jardiance    Please change to one most like Invokana

## 2018-06-20 ENCOUNTER — Encounter: Payer: Self-pay | Admitting: Gastroenterology

## 2018-06-25 ENCOUNTER — Encounter: Payer: Self-pay | Admitting: Physician Assistant

## 2018-06-25 ENCOUNTER — Ambulatory Visit: Payer: Managed Care, Other (non HMO) | Admitting: Physician Assistant

## 2018-06-25 ENCOUNTER — Other Ambulatory Visit: Payer: Self-pay

## 2018-06-25 VITALS — BP 123/87 | HR 76 | Temp 97.8°F | Ht 69.0 in | Wt 254.2 lb

## 2018-06-25 DIAGNOSIS — R079 Chest pain, unspecified: Secondary | ICD-10-CM

## 2018-06-25 DIAGNOSIS — I25110 Atherosclerotic heart disease of native coronary artery with unstable angina pectoris: Secondary | ICD-10-CM | POA: Diagnosis not present

## 2018-06-25 DIAGNOSIS — I209 Angina pectoris, unspecified: Secondary | ICD-10-CM

## 2018-06-25 MED ORDER — NITROGLYCERIN 0.4 MG SL SUBL: 0.4000 mg | SUBLINGUAL_TABLET | SUBLINGUAL | 2 refills | Status: AC | PRN

## 2018-06-26 NOTE — Progress Notes (Signed)
BP 123/87   Pulse 76   Temp 97.8 F (36.6 C) (Oral)   Ht 5\' 9"  (1.753 m)   Wt 254 lb 3.2 oz (115.3 kg)   BMI 37.54 kg/m    Subjective:    Patient ID: David Black, male    DOB: 07/04/1961, 57 y.o.   MRN: [id number]  HPI: David Black is a 57 y.o. male presenting on 06/25/2018 for chest pain and left arm pain David Black is accompanied by his wife today.  He had an episode last night during the night where he had chest pain and left arm pain.  He notes that he was very diaphoretic.  He did not get up and move around.  He did pass after few minutes.  He did lay back down and go to sleep.  He notes that he has not had one this bad in the past.  He has had milder versions of this.  There is a very strong history of cardiac disease and early MI in his father.  This patient is a diabetic.  He would be very appropriate to have him referred for cardiovascular evaluation.  It is of note that the patient has tried many cholesterol medications.  He had severe muscle aches related to statins.  We have discussed that if this occurs again and he has to take a nitroglycerin tablet which was prescribed today he needs to call 911 and get to the emergency room.   Past Medical History:  Diagnosis Date  . CAD (coronary artery disease)    Minimal plaque on catheterization Nov 2011; EF normal by history  . Chest discomfort 07/18/2010   With exertion  . Depression with anxiety   . Diabetes mellitus without complication ([place])   . Dyslipidemia    x years  . GERD (gastroesophageal reflux disease)   . Heart murmur   . Hyperlipidemia   . Hypertension    x 10 years  . Nocturnal hypoxia 09/14/2013  . Obesity, unspecified   . Smoker    x 20 years, 1 1/2 ppd, quit 7 years ago  . SOB (shortness of breath) 07/18/2010   Relevant past medical, surgical, family and social history reviewed and updated as indicated. Interim medical history since our last visit reviewed. Allergies and medications reviewed  and updated. DATA REVIEWED: CHART IN EPIC  Family History reviewed for pertinent findings.  Review of Systems  Constitutional: Negative.  Negative for appetite change and fatigue.  HENT: Negative.   Eyes: Negative.  Negative for pain and visual disturbance.  Respiratory: Negative.  Negative for cough, chest tightness, shortness of breath and wheezing.   Cardiovascular: Positive for chest pain. Negative for palpitations and leg swelling.  Gastrointestinal: Negative.  Negative for abdominal pain, diarrhea, nausea and vomiting.  Endocrine: Negative.   Genitourinary: Negative.   Musculoskeletal: Negative.   Skin: Negative.  Negative for color change and rash.  Neurological: Negative.  Negative for weakness, numbness and headaches.  Psychiatric/Behavioral: Negative.     Allergies as of 06/25/2018      Reactions   Advicor [niacin-lovastatin Er] Other (See Comments)   unknown   Altace [ramipril] Other (See Comments)   cough   Bextra [valdecoxib] Swelling   Bupropion Hcl Other (See Comments)   Unknown   Janumet [sitagliptin-metformin Hcl] Other (See Comments)   unknown   Niacin Other (See Comments)   unknown   Rofecoxib Swelling   Sulfamethoxazole-trimethoprim Other (See Comments)   unknown   Sulfonamide  Derivatives Other (See Comments)   unknown      Medication List        Accurate as of 06/25/18 11:59 PM. Always use your most recent med list.          aspirin 81 MG tablet Take 81 mg by mouth daily.   BAYER CONTOUR NEXT TEST test strip Generic drug:  glucose blood Use as instructed   cyclobenzaprine 10 MG tablet Commonly known as:  FLEXERIL Take 1 tablet (10 mg total) by mouth 3 (three) times daily as needed for muscle spasms.   empagliflozin 25 MG Tabs tablet Commonly known as:  JARDIANCE Take 25 mg by mouth daily.   fenofibrate 54 MG tablet Take 1 tablet (54 mg total) by mouth daily.   gabapentin 300 MG capsule Commonly known as:  NEURONTIN TAKE (1)  CAPSULE THREE TIMES DAILY.   meloxicam 15 MG tablet Commonly known as:  MOBIC Take 1 tablet (15 mg total) by mouth daily. (if needed for joint pain)   nitroGLYCERIN 0.4 MG SL tablet Commonly known as:  NITROSTAT Place 1 tablet (0.4 mg total) under the tongue every 5 (five) minutes as needed for chest pain.   olmesartan-hydrochlorothiazide 20-12.5 MG tablet Commonly known as:  BENICAR HCT Take 1 tablet by mouth daily.   ONETOUCH DELICA LANCETS 29N Misc CHECK BLOOD SUGAR 2 TIMES A DAY   pantoprazole 40 MG tablet Commonly known as:  PROTONIX Take 1 tablet (40 mg total) by mouth daily.          Objective:    BP 123/87   Pulse 76   Temp 97.8 F (36.6 C) (Oral)   Ht 5\' 9"  (1.753 m)   Wt 254 lb 3.2 oz (115.3 kg)   BMI 37.54 kg/m   Allergies  Allergen Reactions  . Advicor [Niacin-Lovastatin Er] Other (See Comments)    unknown  . Altace [Ramipril] Other (See Comments)    cough  . Bextra [Valdecoxib] Swelling  . Bupropion Hcl Other (See Comments)    Unknown  . Janumet [Sitagliptin-Metformin Hcl] Other (See Comments)    unknown  . Niacin Other (See Comments)    unknown  . Rofecoxib Swelling  . Sulfamethoxazole-Trimethoprim Other (See Comments)    unknown  . Sulfonamide Derivatives Other (See Comments)    unknown    Wt Readings from Last 3 Encounters:  06/25/18 254 lb 3.2 oz (115.3 kg)  06/10/18 259 lb (117.5 kg)  06/03/18 252 lb 6.4 oz (114.5 kg)    Physical Exam  Constitutional: He appears well-developed and well-nourished.  HENT:  Head: Normocephalic and atraumatic.  Eyes: Pupils are equal, round, and reactive to light. Conjunctivae and EOM are normal.  Neck: Normal range of motion. Neck supple.  Cardiovascular: Normal rate, regular rhythm and normal heart sounds.  Pulmonary/Chest: Effort normal and breath sounds normal.  Abdominal: Soft. Bowel sounds are normal.  Musculoskeletal: Normal range of motion.  Skin: Skin is warm and dry.    Results for  orders placed or performed in visit on 05/30/18  Bayer DCA Hb A1c Waived  Result Value Ref Range   HB A1C (BAYER DCA - WAIVED) 6.2 <9.8 %  Basic Metabolic Panel  Result Value Ref Range   Glucose 105 (H) 65 - 99 mg/dL   BUN 14 6 - 24 mg/dL   Creatinine, Ser 1.12 0.76 - 1.27 mg/dL   GFR calc non Af Amer 74 >59 mL/min/1.73   GFR calc Af Amer 85 >59 mL/min/1.73   BUN/Creatinine Ratio  13 9 - 20   Sodium 142 134 - 144 mmol/L   Potassium 4.3 3.5 - 5.2 mmol/L   Chloride 101 96 - 106 mmol/L   CO2 22 20 - 29 mmol/L   Calcium 10.3 (H) 8.7 - 10.2 mg/dL      Assessment & Plan:   1. Chest pain, unspecified type EKG was stable from the previous one in the past year Referral to cardiology - EKG 12-Lead  2. Coronary artery disease involving native heart with unstable angina pectoris, unspecified vessel or lesion type [facility]) Cardiology referral Nitroglycerin sublingual prescription given   Continue all other maintenance medications as listed above.  Follow up plan: No follow-ups on file.  Educational handout given for [place] PA-C [facility] [address]  [place], [place] [address] [phone]   06/26/2018, 10:22 AM

## 2018-07-04 ENCOUNTER — Encounter: Payer: Managed Care, Other (non HMO) | Admitting: Gastroenterology

## 2018-07-04 ENCOUNTER — Ambulatory Visit: Payer: Managed Care, Other (non HMO) | Admitting: Cardiology

## 2018-07-04 ENCOUNTER — Encounter: Payer: Self-pay | Admitting: Cardiology

## 2018-07-04 ENCOUNTER — Ambulatory Visit (HOSPITAL_BASED_OUTPATIENT_CLINIC_OR_DEPARTMENT_OTHER)
Admission: RE | Admit: 2018-07-04 | Discharge: 2018-07-04 | Disposition: A | Discharge status: Home / Self Care | Payer: Managed Care, Other (non HMO) | Source: Ambulatory Visit | Attending: Cardiology | Admitting: Cardiology

## 2018-07-04 VITALS — BP 122/78 | HR 72 | Ht 69.0 in | Wt 256.0 lb

## 2018-07-04 DIAGNOSIS — E782 Mixed hyperlipidemia: Secondary | ICD-10-CM

## 2018-07-04 DIAGNOSIS — I10 Essential (primary) hypertension: Secondary | ICD-10-CM | POA: Diagnosis not present

## 2018-07-04 DIAGNOSIS — Z01818 Encounter for other preprocedural examination: Secondary | ICD-10-CM

## 2018-07-04 DIAGNOSIS — I209 Angina pectoris, unspecified: Secondary | ICD-10-CM | POA: Diagnosis not present

## 2018-07-04 DIAGNOSIS — E088 Diabetes mellitus due to underlying condition with unspecified complications: Secondary | ICD-10-CM | POA: Diagnosis not present

## 2018-07-04 NOTE — Addendum Note (Signed)
Addended by: Mattie Marlin on: 07/04/2018 12:12 PM   Modules accepted: Orders

## 2018-07-04 NOTE — Progress Notes (Addendum)
Cardiology Office Note:    Date:  07/04/2018   ID:  David Black, DOB Jun 22, 1961, MRN [id number]  PCP:  Janora Norlander, DO  Cardiologist:  Jenean Lindau, MD   Referring MD: Janora Norlander, DO    ASSESSMENT:    1. Angina pectoris ([place])   2. Mixed hyperlipidemia   3. Essential hypertension   4. Diabetes mellitus due to underlying condition with unspecified complications ([place])    PLAN:    In order of problems listed above:  1. Primary prevention stressed with the patient.  Importance of compliance with diet and medication stressed and he vocalized understanding.  Diet was discussed for dyslipidemia and diabetes mellitus and weight reduction was stressed.  Risks of obesity explained.  I will be evaluating for statin therapy once he comes back after the below mentioned evaluation. 2. His symptoms appear concerning.  Sublingual nitroglycerin prescription was sent, its protocol and 911 protocol explained and the patient vocalized understanding questions were answered to the patient's satisfaction 3. In view of the patient's symptoms, I discussed with the patient options for evaluation. Invasive and noninvasive options were given to the patient. I discussed stress testing and coronary angiography and left heart catheterization at length. Benefits, pros and cons of each approach were discussed at length. Patient had multiple questions which were answered to the patient's satisfaction. Patient opted for invasive evaluation and we will set up for coronary angiography and left heart catheterization. Further recommendations will be made based on the findings with coronary angiography. In the interim if the patient has any significant symptoms in hospital to the nearest emergency room. 4. During the course of my discussion I also mentioned to the patient and his wife the option of coronary CT angiography as another alternative for testing.  At the time of this dictation the patient and his  wife called back to mention that they would prefer the coronary CT angiography option.  I have placed the orders for this test to be performed and will do the needful to facilitate it.    Medication Adjustments/Labs and Tests Ordered: Current medicines are reviewed at length with the patient today.  Concerns regarding medicines are outlined above.  No orders of the defined types were placed in this encounter.  No orders of the defined types were placed in this encounter.    History of Present Illness:    David Black is a 57 y.o. male who is being seen today for the evaluation of chest tightness suggesting of angina at the request of Ronnie Doss M, DO.  Patient is a pleasant 57 year old male.  He is accompanied by his wife today for this visit.  His history of essential hypertension, long-standing diabetes mellitus, obesity, dyslipidemia intolerant to statins.  He leads a sedentary lifestyle.  He tells me that he has been having several episodes of chest tightness which radiated to the left arm.  It woke him up the other day.  Subsequently he seen his primary care doctor and he was recommended nitroglycerin which helped the symptoms after he took 2 tablets at 5-minute intervals.  This has been of significant concern for him.  Again he leads a sedentary lifestyle.  He is an ex-smoker and a strong family history of coronary artery disease.  Therefore he was sent here for evaluation.  At the time of my evaluation, the patient is alert awake oriented and in no distress.  Past Medical History:  Diagnosis Date  . CAD (coronary artery  disease)    Minimal plaque on catheterization Nov 2011; EF normal by history  . Chest discomfort 07/18/2010   With exertion  . Depression with anxiety   . Diabetes mellitus without complication ([place])   . Dyslipidemia    x years  . GERD (gastroesophageal reflux disease)   . Heart murmur   . Hyperlipidemia   . Hypertension    x 10 years  . Nocturnal  hypoxia 09/14/2013  . Obesity, unspecified   . Smoker    x 20 years, 1 1/2 ppd, quit 7 years ago  . SOB (shortness of breath) 07/18/2010    Past Surgical History:  Procedure Laterality Date  . CHOLECYSTECTOMY    . FINGER SURGERY     right hand,ring finger  . HERNIA REPAIR    . VENTRAL HERNIA REPAIR  06/2010   x3    Current Medications: Current Meds  Medication Sig  . aspirin 81 MG tablet Take 81 mg by mouth daily.    Marland Kitchen BAYER CONTOUR NEXT TEST test strip Use as instructed  . cyclobenzaprine (FLEXERIL) 10 MG tablet Take 1 tablet (10 mg total) by mouth 3 (three) times daily as needed for muscle spasms.  . empagliflozin (JARDIANCE) 25 MG TABS tablet Take 25 mg by mouth daily.  . fenofibrate 54 MG tablet Take 1 tablet (54 mg total) by mouth daily.  Marland Kitchen gabapentin (NEURONTIN) 300 MG capsule TAKE (1) CAPSULE THREE TIMES DAILY.  . meloxicam (MOBIC) 15 MG tablet Take 1 tablet (15 mg total) by mouth daily. (if needed for joint pain)  . nitroGLYCERIN (NITROSTAT) 0.4 MG SL tablet Place 1 tablet (0.4 mg total) under the tongue every 5 (five) minutes as needed for chest pain.  Marland Kitchen olmesartan-hydrochlorothiazide (BENICAR HCT) 20-12.5 MG tablet Take 1 tablet by mouth daily.  Glory Rosebush DELICA LANCETS 75Z MISC CHECK BLOOD SUGAR 2 TIMES A DAY  . pantoprazole (PROTONIX) 40 MG tablet Take 1 tablet (40 mg total) by mouth daily.     Allergies:   Advicor [niacin-lovastatin er]; Altace [ramipril]; Bextra [valdecoxib]; Bupropion hcl; Janumet [sitagliptin-metformin hcl]; Niacin; Rofecoxib; Sulfamethoxazole-trimethoprim; and Sulfonamide derivatives   Social History   Socioeconomic History  . Marital status: Married    Spouse name: Not on file  . Number of children: Not on file  . Years of education: Not on file  . Highest education level: Not on file  Occupational History  . Not on file  Social Needs  . Financial resource strain: Not on file  . Food insecurity:    Worry: Not on file    Inability:  Not on file  . Transportation needs:    Medical: Not on file    Non-medical: Not on file  Tobacco Use  . Smoking status: Former Smoker    Packs/day: 1.50    Years: 20.00    Pack years: 30.00    Types: Cigarettes    Last attempt to quit: 08/20/2002    Years since quitting: 15.8  . Smokeless tobacco: Never Used  Substance and Sexual Activity  . Alcohol use: No    Alcohol/week: 0.0 standard drinks  . Drug use: No  . Sexual activity: Not on file  Lifestyle  . Physical activity:    Days per week: Not on file    Minutes per session: Not on file  . Stress: Not on file  Relationships  . Social connections:    Talks on phone: Not on file    Gets together: Not on file  Attends religious service: Not on file    Active member of club or organization: Not on file    Attends meetings of clubs or organizations: Not on file    Relationship status: Not on file  Other Topics Concern  . Not on file  Social History Narrative   Married with 1 daughter     Family History: The patient's family history includes Breast cancer in his paternal aunt; Coronary artery disease in his other; Diabetes in his mother; Heart disease in his father and paternal uncle; Hyperlipidemia in his father and mother; Hypertension in his father and mother; Kidney disease in his father; Testicular cancer in his paternal uncle. There is no history of Colon cancer, Esophageal cancer, Stomach cancer, or Rectal cancer.  ROS:   Please see the history of present illness.    All other systems reviewed and are negative.  EKGs/Labs/Other Studies Reviewed:    The following studies were reviewed today: I discussed my findings with the patient at extensive length.  EKG reveals sinus rhythm and nonspecific ST-T changes.   Recent Labs: 10/01/2017: TSH 1.320 01/15/2018: ALT 44; Hemoglobin 15.8; Platelets 248 05/30/2018: BUN 14; Creatinine, Ser 1.12; Potassium 4.3; Sodium 142  Recent Lipid Panel    Component Value Date/Time    CHOL 165 10/01/2017 1457   TRIG 161 (H) 10/01/2017 1457   TRIG 472 (H) 09/14/2013 0854   HDL 33 (L) 10/01/2017 1457   HDL 35 (L) 09/14/2013 0854   CHOLHDL 5.0 10/01/2017 1457   LDLCALC 100 (H) 10/01/2017 1457   LDLCALC NOTES: 09/14/2013 0854    Physical Exam:    VS:  BP 122/78 (BP Location: Right Arm, Patient Position: Sitting, Cuff Size: Normal)   Pulse 72   Ht 5\' 9"  (1.753 m)   Wt 256 lb (116.1 kg)   SpO2 98%   BMI 37.80 kg/m     Wt Readings from Last 3 Encounters:  07/04/18 256 lb (116.1 kg)  06/25/18 254 lb 3.2 oz (115.3 kg)  06/10/18 259 lb (117.5 kg)     GEN: Patient is in no acute distress HEENT: Normal NECK: No JVD; No carotid bruits LYMPHATICS: No lymphadenopathy CARDIAC: S1 S2 regular, 2/6 systolic murmur at the apex. RESPIRATORY:  Clear to auscultation without rales, wheezing or rhonchi  ABDOMEN: Soft, non-tender, non-distended MUSCULOSKELETAL:  No edema; No deformity  SKIN: Warm and dry NEUROLOGIC:  Alert and oriented x 3 PSYCHIATRIC:  Normal affect    Signed, Jenean Lindau, MD  07/04/2018 11:56 AM    [place]

## 2018-07-04 NOTE — Patient Instructions (Signed)
Medication Instructions:  Your physician recommends that you continue on your current medications as directed. Please refer to the Current Medication list given to you today.  If you need a refill on your cardiac medications before your next appointment, please call your pharmacy.   Lab work: Your physician recommends that you have the following labs drawn: BMP and CBC today.  If you have labs (blood work) drawn today and your tests are completely normal, you will receive your results only by: Marland Kitchen MyChart Message (if you have MyChart) OR . A paper copy in the mail If you have any lab test that is abnormal or we need to change your treatment, we will call you to review the results.  Testing/Procedures: A chest x-ray takes a picture of the organs and structures inside the chest, including the heart, lungs, and blood vessels. This test can show several things, including, whether the heart is enlarges; whether fluid is building up in the lungs; and whether pacemaker / defibrillator leads are still in place.     [facility] [place], [address] [place] [address] Dept: [phone] Loc: [place]  07/04/2018  You are scheduled for a Cardiac Catheterization on Tuesday, September 19 with Dr. Peter Martinique.  1. Please arrive at the [facility]Main Entrance [facility] [address] [place], [place] [address] at 7:00 AM (This time is two hours before your procedure to ensure your preparation). Free valet parking service is available.   Special note: Every effort is made to have your procedure done on time. Please understand that emergencies sometimes delay scheduled procedures.  2. Diet: Do not eat solid foods after midnight.  The patient may have clear liquids until 5am upon the day of the procedure.  3. Labs: to be done today.  4. Medication instructions in  preparation for your procedure:   Contrast Allergy: No  Do not take olmesartan-hctz the morning of the heart cath.  JardianceTuesday, November 19,  On the morning of your procedure, take your Aspirin and any morning medicines NOT listed above.  You may use sips of water.  5. Plan for one night stay--bring personal belongings. 6. Bring a current list of your medications and current insurance cards. 7. You MUST have a responsible person to drive you home. 8. Someone MUST be with you the first 24 hours after you arrive home or your discharge will be delayed. 9. Please wear clothes that are easy to get on and off and wear slip-on shoes.  Thank you for allowing [place] to care for you!   -- [facility]   Follow-Up: At [facility], you and your health needs are our priority.  As part of our continuing mission to provide you with exceptional heart care, we have created designated Provider Care Teams.  These Care Teams include your primary Cardiologist (physician) and Advanced Practice Providers (APPs -  Physician Assistants and Nurse Practitioners) who all work together to provide you with the care you need, when you need it.  You will need a follow up appointment in 6 weeks.  Please call our office 2 months in advance to schedule this appointment.  You may see another member of our [employer] Provider Team in [place]: Jenne Campus, MD . Shirlee More, MD  Any Other Special Instructions Will Be Listed Below (If Applicable).   Coronary Angiogram With Stent  Coronary angiogram with stent placement is a procedure to widen or open a narrow blood vessel of the heart (coronary artery). Arteries may become blocked by cholesterol buildup (plaques) in the lining of the wall. When a coronary artery becomes partially blocked, blood flow to that area decreases. This may lead to chest pain or a heart attack (myocardial infarction). A stent is a small piece of metal  that looks like mesh or a spring. Stent placement may be done as treatment for a heart attack or right after a coronary angiogram in which a blocked artery is found. Let your health care provider know about:  Any allergies you have.  All medicines you are taking, including vitamins, herbs, eye drops, creams, and over-the-counter medicines.  Any problems you or family members have had with anesthetic medicines.  Any blood disorders you have.  Any surgeries you have had.  Any medical conditions you have.  Whether you are pregnant or may be pregnant. What are the risks? Generally, this is a safe procedure. However, problems may occur, including:  Damage to the heart or its blood vessels.  A return of blockage.  Bleeding, infection, or bruising at the insertion site.  A collection of blood under the skin (hematoma) at the insertion site.  A blood clot in another part of the body.  Kidney injury.  Allergic reaction to the dye or contrast that is used.  Bleeding into the abdomen (retroperitoneal bleeding).  What happens before the procedure? Staying hydrated Follow instructions from your health care provider about hydration, which may include:  Up to 2 hours before the procedure - you may continue to drink clear liquids, such as water, clear fruit juice, black coffee, and plain tea.  Eating and drinking restrictions Follow instructions from your health care provider about eating and drinking, which may include:  8 hours before the procedure - stop eating heavy meals or foods such as meat, fried foods, or fatty foods.  6 hours before the procedure - stop eating light meals or foods, such as toast or cereal.  2 hours before the procedure - stop drinking clear liquids.  Ask your health care provider about:  Changing or stopping your regular medicines. This is especially important if you are taking diabetes medicines or blood thinners.  Taking medicines such as ibuprofen.  These medicines can thin your blood. Do not take these medicines before your procedure if your health care provider instructs you not to. Generally, aspirin is recommended before a procedure of passing a small, thin tube (catheter) through a blood vessel and into the heart (cardiac catheterization).  What happens during the procedure?  An IV tube will be inserted into one of your veins.  You will be given one or more of the following: ? A medicine to help you relax (sedative). ? A medicine to numb the area where the catheter will be inserted into an artery (local anesthetic).  To reduce your risk of infection: ? Your health care team will wash or sanitize their hands. ? Your skin will be washed with soap. ? Hair may be removed from the area where the catheter will be inserted.  Using a guide wire, the catheter will be inserted into an artery. The location may be in your groin, in your wrist, or in the fold of your arm (near your elbow).  A type of X-ray (fluoroscopy) will be used to help guide the catheter to the opening of the arteries in the heart.  A dye will  be injected into the catheter, and X-rays will be taken. The dye will help to show where any narrowing or blockages are located in the arteries.  A tiny wire will be guided to the blocked spot, and a balloon will be inflated to make the artery wider.  The stent will be expanded and will crush the plaques into the wall of the vessel. The stent will hold the area open and improve the blood flow. Most stents have a drug coating to reduce the risk of the stent narrowing over time.  The artery may be made wider using a drill, laser, or other tools to remove plaques.  When the blood flow is better, the catheter will be removed. The lining of the artery will grow over the stent, which stays where it was placed. This procedure may vary among health care providers and hospitals. What happens after the procedure?  If the procedure is  done through the leg, you will be kept in bed lying flat for about 6 hours. You will be instructed to not bend and not cross your legs.  The insertion site will be checked frequently.  The pulse in your foot or wrist will be checked frequently.  You may have additional blood tests, X-rays, and a test that records the electrical activity of your heart (electrocardiogram, or ECG). This information is not intended to replace advice given to you by your health care provider. Make sure you discuss any questions you have with your health care provider. Document Released: 02/10/2003 Document Revised: 04/05/2016 Document Reviewed: 03/11/2016 Elsevier Interactive Patient Education  [employer].

## 2018-07-05 LAB — BASIC METABOLIC PANEL
BUN/Creatinine Ratio: 18 (ref 9–20)
BUN: 21 mg/dL (ref 6–24)
CO2: 23 mmol/L (ref 20–29)
CREATININE: 1.19 mg/dL (ref 0.76–1.27)
Calcium: 10.2 mg/dL (ref 8.7–10.2)
Chloride: 104 mmol/L (ref 96–106)
GFR calc Af Amer: 79 mL/min/{1.73_m2} (ref >59)
GFR, EST NON AFRICAN AMERICAN: 68 mL/min/{1.73_m2} (ref >59)
GLUCOSE: 105 mg/dL — AB (ref 65–99)
Potassium: 4.3 mmol/L (ref 3.5–5.2)
Sodium: 143 mmol/L (ref 134–144)

## 2018-07-05 LAB — CBC WITH DIFFERENTIAL/PLATELET
BASOS ABS: 0.1 10*3/uL (ref 0.0–0.2)
Basos: 1 %
EOS (ABSOLUTE): 0.2 10*3/uL (ref 0.0–0.4)
Eos: 3 %
HEMATOCRIT: 47.4 % (ref 37.5–51.0)
HEMOGLOBIN: 16.7 g/dL (ref 13.0–17.7)
IMMATURE GRANS (ABS): 0 10*3/uL (ref 0.0–0.1)
Immature Granulocytes: 1 %
LYMPHS ABS: 1.8 10*3/uL (ref 0.7–3.1)
LYMPHS: 24 %
MCH: 30.8 pg (ref 26.6–33.0)
MCHC: 35.2 g/dL (ref 31.5–35.7)
MCV: 87 fL (ref 79–97)
MONOCYTES: 9 %
Monocytes Absolute: 0.6 10*3/uL (ref 0.1–0.9)
NEUTROS ABS: 4.6 10*3/uL (ref 1.4–7.0)
Neutrophils: 62 %
Platelets: 245 10*3/uL (ref 150–450)
RBC: 5.43 x10E6/uL (ref 4.14–5.80)
RDW: 13 % (ref 12.3–15.4)
WBC: 7.3 10*3/uL (ref 3.4–10.8)

## 2018-07-07 ENCOUNTER — Telehealth: Payer: Self-pay | Admitting: *Deleted

## 2018-07-07 NOTE — Telephone Encounter (Signed)
Pt contacted pre-catheterization scheduled at [facility] for: Tuesday July 08, 2018 9 AM Verified arrival time and place: [facility] Entrance A at: 7 AM  No solid food after midnight prior to cath, clear liquids until 5 AM day of procedure. Contrast allergy: no  Hold: Jardiance-AM of procedure. Olmesartan-HCT-AM of procedure.   Except hold medications AM meds can be  taken pre-cath with sip of water including: ASA 81 mg  Confirmed patient has responsible person to drive home post procedure and for 24 hours after you arrive home: yes

## 2018-07-08 ENCOUNTER — Ambulatory Visit (HOSPITAL_COMMUNITY)
Admission: RE | Admit: 2018-07-08 | Payer: Managed Care, Other (non HMO) | Source: Ambulatory Visit | Admitting: Cardiology

## 2018-07-08 ENCOUNTER — Encounter (HOSPITAL_COMMUNITY): Admission: RE | Payer: Self-pay | Source: Ambulatory Visit

## 2018-07-08 SURGERY — LEFT HEART CATH AND CORONARY ANGIOGRAPHY
Anesthesia: LOCAL

## 2018-07-09 ENCOUNTER — Other Ambulatory Visit: Payer: Self-pay

## 2018-07-09 DIAGNOSIS — I209 Angina pectoris, unspecified: Secondary | ICD-10-CM

## 2018-07-09 NOTE — Progress Notes (Signed)
Order for CTA with FFR was placed. Staff message to pre-cert will be sent today.

## 2018-07-14 ENCOUNTER — Ambulatory Visit: Payer: Managed Care, Other (non HMO) | Admitting: Cardiology

## 2018-07-14 ENCOUNTER — Encounter: Payer: Self-pay | Admitting: Cardiology

## 2018-07-14 VITALS — BP 126/78 | HR 90 | Ht 69.0 in | Wt 258.8 lb

## 2018-07-14 DIAGNOSIS — I209 Angina pectoris, unspecified: Secondary | ICD-10-CM

## 2018-07-14 DIAGNOSIS — E088 Diabetes mellitus due to underlying condition with unspecified complications: Secondary | ICD-10-CM | POA: Diagnosis not present

## 2018-07-14 DIAGNOSIS — I10 Essential (primary) hypertension: Secondary | ICD-10-CM | POA: Diagnosis not present

## 2018-07-14 DIAGNOSIS — E782 Mixed hyperlipidemia: Secondary | ICD-10-CM

## 2018-07-14 MED ORDER — RANOLAZINE ER 500 MG PO TB12: 500.0000 mg | ORAL_TABLET | Freq: Two times a day (BID) | ORAL | 1 refills | Status: DC

## 2018-07-14 NOTE — Patient Instructions (Addendum)
Medication Instructions:  Your physician has recommended you make the following change in your medication:   START: Ranexa 500 mg twice daily.   If you need a refill on your cardiac medications before your next appointment, please call your pharmacy.   Lab work: None.  If you have labs (blood work) drawn today and your tests are completely normal, you will receive your results only by: Marland Kitchen MyChart Message (if you have MyChart) OR . A paper copy in the mail If you have any lab test that is abnormal or we need to change your treatment, we will call you to review the results.  Testing/Procedures:    [facility] [address], [place] [address] Dept: [phone] Loc: [place]  07/14/2018  You are scheduled for a Cardiac Catheterization. We will call with the date and time.   1. Please arrive at the [facility] (Main Entrance A) at [facility]: [address] [place], [place] [address] at (we will call with this time) (This time is two hours before your procedure to ensure your preparation). Free valet parking service is available.   Special note: Every effort is made to have your procedure done on time. Please understand that emergencies sometimes delay scheduled procedures.  2. Diet: Do not eat solid foods after midnight.  The patient may have clear liquids until 5am upon the day of the procedure.  3. Labs: You have already had labs drawn.  4. Medication instructions in preparation for your procedure:   Contrast Allergy: No      Hold Jardiance and Olmesartin-hctz the day of the procedure.   On the morning of your procedure, take your Aspirin and any morning medicines NOT listed above.  You may use sips of water.  5. Plan for one night stay--bring personal belongings. 6. Bring a current list of your medications and current insurance cards. 7. You  MUST have a responsible person to drive you home. 8. Someone MUST be with you the first 24 hours after you arrive home or your discharge will be delayed. 9. Please wear clothes that are easy to get on and off and wear slip-on shoes.  Thank you for allowing [place] to care for you!   -- [facility] ]  Follow-Up: At [facility], you and your health needs are our priority.  As part of our continuing mission to provide you with exceptional heart care, we have created designated Provider Care Teams.  These Care Teams include your primary Cardiologist (physician) and Advanced Practice Providers (APPs -  Physician Assistants and Nurse Practitioners) who all work together to provide you with the care you need, when you need it. You will need a follow up appointment in 1 months.  Please call our office 2 months in advance to schedule this appointment.  You may see No primary care provider on file. or another member of our [employer] Provider Team in [place]: Shirlee More, MD . Jyl Heinz, MD  Any Other Special Instructions Will Be Listed Below (If Applicable).    Ranolazine tablets, extended release What is this medicine? RANOLAZINE (ra NOE la zeen) is a heart medicine. It is used to treat chronic chest pain (angina). This medicine must be taken regularly. It will not relieve an acute episode of chest pain. This medicine may be used for other purposes; ask your health care provider or pharmacist if  you have questions. COMMON BRAND NAME(S): Ranexa What should I tell my health care provider before I take this medicine? They need to know if you have any of these conditions: -heart disease -irregular heartbeat -kidney disease -liver disease -low levels of potassium or magnesium in the blood -an unusual or allergic reaction to ranolazine, other medicines, foods, dyes, or preservatives -pregnant or trying to get pregnant -breast-feeding How should I use this  medicine? Take this medicine by mouth with a glass of water. Follow the directions on the prescription label. Do not cut, crush, or chew this medicine. Take with or without food. Do not take this medication with grapefruit juice. Take your doses at regular intervals. Do not take your medicine more often then directed. Talk to your pediatrician regarding the use of this medicine in children. Special care may be needed. Overdosage: If you think you have taken too much of this medicine contact a poison control center or emergency room at once. NOTE: This medicine is only for you. Do not share this medicine with others. What if I miss a dose? If you miss a dose, take it as soon as you can. If it is almost time for your next dose, take only that dose. Do not take double or extra doses. What may interact with this medicine? Do not take this medicine with any of the following medications: -antivirals for HIV or AIDS -cerivastatin -certain antibiotics like chloramphenicol, clarithromycin, dalfopristin; quinupristin, isoniazid, rifabutin, rifampin, rifapentine -certain medicines used for cancer like imatinib, nilotinib -certain medicines for fungal infections like fluconazole, itraconazole, ketoconazole, posaconazole, voriconazole -certain medicines for irregular heart beat like dofetilide, dronedarone -certain medicines for seizures like carbamazepine, fosphenytoin, oxcarbazepine, phenobarbital, phenytoin -cisapride -conivaptan -cyclosporine -grapefruit or grapefruit juice -lumacaftor; ivacaftor -nefazodone -pimozide -quinacrine -St John's wort -thioridazine -ziprasidone This medicine may also interact with the following medications: -alfuzosin -certain medicines for depression, anxiety, or psychotic disturbances like bupropion, citalopram, fluoxetine, fluphenazine, paroxetine, perphenazine, risperidone, sertraline, trifluoperazine -certain medicines for cholesterol like atorvastatin,  lovastatin, simvastatin -certain medicines for stomach problems like octreotide, palonosetron, prochlorperazine -eplerenone -ergot alkaloids like dihydroergotamine, ergonovine, ergotamine, methylergonovine -metformin -nicardipine -other medicines that prolong the QT interval (cause an abnormal heart rhythm) -sirolimus -tacrolimus This list may not describe all possible interactions. Give your health care provider a list of all the medicines, herbs, non-prescription drugs, or dietary supplements you use. Also tell them if you smoke, drink alcohol, or use illegal drugs. Some items may interact with your medicine. What should I watch for while using this medicine? Visit your doctor for regular check ups. Tell your doctor or healthcare professional if your symptoms do not start to get better or if they get worse. This medicine will not relieve an acute attack of angina or chest pain. This medicine can change your heart rhythm. Your health care provider may check your heart rhythm by ordering an electrocardiogram (ECG) while you are taking this medicine. You may get drowsy or dizzy. Do not drive, use machinery, or do anything that needs mental alertness until you know how this medicine affects you. Do not stand or sit up quickly, especially if you are an older patient. This reduces the risk of dizzy or fainting spells. Alcohol may interfere with the effect of this medicine. Avoid alcoholic drinks. If you are scheduled for any medical or dental procedure, tell your healthcare provider that you are taking this medicine. This medicine can interact with other medicines used during surgery. What side effects may I notice from receiving this  medicine? Side effects that you should report to your doctor or health care professional as soon as possible: -allergic reactions like skin rash, itching or hives, swelling of the face, lips, or tongue -breathing problems -changes in vision -fast, irregular or pounding  heartbeat -feeling faint or lightheaded, falls -low or high blood pressure -numbness or tingling feelings -ringing in the ears -tremor or shakiness -slow heartbeat (fewer than 50 beats per minute) -swelling of the legs or feet Side effects that usually do not require medical attention (report to your doctor or health care professional if they continue or are bothersome): -constipation -drowsy -dry mouth -headache -nausea or vomiting -stomach upset This list may not describe all possible side effects. Call your doctor for medical advice about side effects. You may report side effects to FDA at 1-800-FDA-1088. Where should I keep my medicine? Keep out of the reach of children. Store at room temperature between 15 and 30 degrees C (59 and 86 degrees F). Throw away any unused medicine after the expiration date. NOTE: This sheet is a summary. It may not cover all possible information. If you have questions about this medicine, talk to your doctor, pharmacist, or health care provider.  2018 Elsevier/Gold Standard (2015-09-08 12:24:15)

## 2018-07-14 NOTE — H&P (View-Only) (Signed)
Cardiology Office Note:    Date:  07/14/2018   ID:  David Black, DOB 09/05/1960, MRN [id number]  PCP:  Janora Norlander, DO  Cardiologist:  Jenne Campus, MD    Referring MD: Janora Norlander, DO   Chief Complaint  Patient presents with  . Second opinion  I have chest pain  History of Present Illness:    David Black is a 57 y.o. male with multiple risk factors for coronary artery disease.  He did have a cardiac catheterization November 2011 which showed luminal irregularities.  Now he started experiencing chest discomfort he describes at least 4 episodes of chest pain 3 of those happen at night and rest.  He would wake up in the middle of the night with tightness and sweating in the chest.  He took some Tums with no relief then eventually he did see my partner who gave him nitroglycerin after that he had one episode of chest pain that happened when he was sitting he took nitroglycerin nitroglycerin relieved the pain.  He he walks and climbs stairs he gets short of breath during those but no typical chest pain tightness squeezing pressure burning chest.  He was already scheduled to have a cardiac catheterization but there was some mixup with scheduling he was very upset about this entire scenario therefore I invited him to the office to talk about this.  There was some talks about potentially having CT angiogram apparently insurance company approved cardiac catheterization with no CT Angie.  We had a long discussion about the situation.  He gets atypical chest pain but multiple risk factors for coronary artery disease and history of luminal disease in 2011 I told him that in my opinion the best way to look into it will be to do cardiac catheterization I explained procedure to him including all risk benefits as well as alternatives.  His wife was present during the visit in the office.  In the meantime I will give him prescription for Naprosyn 500 mg twice daily he does have  nitroglycerin knows how to use it.  Past Medical History:  Diagnosis Date  . CAD (coronary artery disease)    Minimal plaque on catheterization Nov 2011; EF normal by history  . Chest discomfort 07/18/2010   With exertion  . Depression with anxiety   . Diabetes mellitus without complication ([place])   . Dyslipidemia    x years  . GERD (gastroesophageal reflux disease)   . Heart murmur   . Hyperlipidemia   . Hypertension    x 10 years  . Nocturnal hypoxia 09/14/2013  . Obesity, unspecified   . Smoker    x 20 years, 1 1/2 ppd, quit 7 years ago  . SOB (shortness of breath) 07/18/2010    Past Surgical History:  Procedure Laterality Date  . CHOLECYSTECTOMY    . FINGER SURGERY     right hand,ring finger  . HERNIA REPAIR    . VENTRAL HERNIA REPAIR  06/2010   x3    Current Medications: Current Meds  Medication Sig  . aspirin 81 MG tablet Take 81 mg by mouth daily.    Marland Kitchen BAYER CONTOUR NEXT TEST test strip Use as instructed  . cyclobenzaprine (FLEXERIL) 10 MG tablet Take 1 tablet (10 mg total) by mouth 3 (three) times daily as needed for muscle spasms. (Patient taking differently: Take 10 mg by mouth as needed for muscle spasms. )  . empagliflozin (JARDIANCE) 25 MG TABS tablet Take 25 mg by  mouth daily.  . fenofibrate 54 MG tablet Take 1 tablet (54 mg total) by mouth daily.  Marland Kitchen gabapentin (NEURONTIN) 300 MG capsule TAKE (1) CAPSULE THREE TIMES DAILY. (Patient taking differently: Take 300 mg by mouth 3 (three) times daily. TAKE (1) CAPSULE THREE TIMES DAILY.)  . meloxicam (MOBIC) 15 MG tablet Take 1 tablet (15 mg total) by mouth daily. (if needed for joint pain) (Patient taking differently: Take 7.5 mg by mouth daily. )  . nitroGLYCERIN (NITROSTAT) 0.4 MG SL tablet Place 1 tablet (0.4 mg total) under the tongue every 5 (five) minutes as needed for chest pain.  Marland Kitchen olmesartan-hydrochlorothiazide (BENICAR HCT) 20-12.5 MG tablet Take 1 tablet by mouth daily.  Glory Rosebush DELICA LANCETS 97C  MISC CHECK BLOOD SUGAR 2 TIMES A DAY  . pantoprazole (PROTONIX) 40 MG tablet Take 1 tablet (40 mg total) by mouth daily.     Allergies:   Advicor [niacin-lovastatin er]; Altace [ramipril]; Bextra [valdecoxib]; Bupropion hcl; Janumet [sitagliptin-metformin hcl]; Niacin; Rofecoxib; Sulfamethoxazole-trimethoprim; and Sulfonamide derivatives   Social History   Socioeconomic History  . Marital status: Married    Spouse name: Not on file  . Number of children: Not on file  . Years of education: Not on file  . Highest education level: Not on file  Occupational History  . Not on file  Social Needs  . Financial resource strain: Not on file  . Food insecurity:    Worry: Not on file    Inability: Not on file  . Transportation needs:    Medical: Not on file    Non-medical: Not on file  Tobacco Use  . Smoking status: Former Smoker    Packs/day: 1.50    Years: 20.00    Pack years: 30.00    Types: Cigarettes    Last attempt to quit: 08/20/2002    Years since quitting: 15.9  . Smokeless tobacco: Never Used  Substance and Sexual Activity  . Alcohol use: No    Alcohol/week: 0.0 standard drinks  . Drug use: No  . Sexual activity: Not on file  Lifestyle  . Physical activity:    Days per week: Not on file    Minutes per session: Not on file  . Stress: Not on file  Relationships  . Social connections:    Talks on phone: Not on file    Gets together: Not on file    Attends religious service: Not on file    Active member of club or organization: Not on file    Attends meetings of clubs or organizations: Not on file    Relationship status: Not on file  Other Topics Concern  . Not on file  Social History Narrative   Married with 1 daughter     Family History: The patient's family history includes Breast cancer in his paternal aunt; Coronary artery disease in his other; Diabetes in his mother; Heart disease in his father and paternal uncle; Hyperlipidemia in his father and mother;  Hypertension in his father and mother; Kidney disease in his father; Testicular cancer in his paternal uncle. There is no history of Colon cancer, Esophageal cancer, Stomach cancer, or Rectal cancer. ROS:   Please see the history of present illness.    All 14 point review of systems negative except as described per history of present illness  EKGs/Labs/Other Studies Reviewed:      Recent Labs: 10/01/2017: TSH 1.320 01/15/2018: ALT 44 07/04/2018: BUN 21; Creatinine, Ser 1.19; Hemoglobin 16.7; Platelets 245; Potassium 4.3; Sodium 143  Recent Lipid Panel    Component Value Date/Time   CHOL 165 10/01/2017 1457   TRIG 161 (H) 10/01/2017 1457   TRIG 472 (H) 09/14/2013 0854   HDL 33 (L) 10/01/2017 1457   HDL 35 (L) 09/14/2013 0854   CHOLHDL 5.0 10/01/2017 1457   LDLCALC 100 (H) 10/01/2017 1457   LDLCALC NOTES: 09/14/2013 0854    Physical Exam:    VS:  BP 126/78   Pulse 90   Ht 5\' 9"  (1.753 m)   Wt 258 lb 12.8 oz (117.4 kg)   SpO2 93%   BMI 38.22 kg/m     Wt Readings from Last 3 Encounters:  07/14/18 258 lb 12.8 oz (117.4 kg)  07/04/18 256 lb (116.1 kg)  06/25/18 254 lb 3.2 oz (115.3 kg)     GEN:  Well nourished, well developed in no acute distress HEENT: Normal NECK: No JVD; No carotid bruits LYMPHATICS: No lymphadenopathy CARDIAC: RRR, no murmurs, no rubs, no gallops RESPIRATORY:  Clear to auscultation without rales, wheezing or rhonchi  ABDOMEN: Soft, non-tender, non-distended MUSCULOSKELETAL:  No edema; No deformity  SKIN: Warm and dry LOWER EXTREMITIES: no swelling NEUROLOGIC:  Alert and oriented x 3 PSYCHIATRIC:  Normal affect   ASSESSMENT:    1. Angina pectoris ([place])   2. Essential hypertension   3. Diabetes mellitus due to underlying condition with unspecified complications ([place])   4. Morbid obesity ([place])   5. Mixed hyperlipidemia    PLAN:    In order of problems listed above:  1. Angina pectoris.  Plan as outlined above he will be scheduled to have a  cardiac catheterization.  Ranolazine 500 nitroglycerin will be given.  He is already on aspirin which I will continue. 2. Essential hypertension blood pressure well controlled continue present management. 3. Diabetes doing well from that point review 4. Morbid obesity.  Will start exercise program after we do cardiac catheterization a better understanding of his coronary artery disease issue. 5. Dyslipidemia with intolerance to multiple statin.  In the future we will consider PCSK9 agent.   Medication Adjustments/Labs and Tests Ordered: Current medicines are reviewed at length with the patient today.  Concerns regarding medicines are outlined above.  No orders of the defined types were placed in this encounter.  Medication changes: No orders of the defined types were placed in this encounter.   Signed, Park Liter, MD, [facility] 07/14/2018 5:06 PM    [facility]

## 2018-07-14 NOTE — Progress Notes (Signed)
Cardiology Office Note:    Date:  07/14/2018   ID:  David Black, DOB 1961/01/21, MRN [id number]  PCP:  Janora Norlander, DO  Cardiologist:  Jenne Campus, MD    Referring MD: Janora Norlander, DO   Chief Complaint  Patient presents with  . Second opinion  I have chest pain  History of Present Illness:    David Black is a 57 y.o. male with multiple risk factors for coronary artery disease.  He did have a cardiac catheterization November 2011 which showed luminal irregularities.  Now he started experiencing chest discomfort he describes at least 4 episodes of chest pain 3 of those happen at night and rest.  He would wake up in the middle of the night with tightness and sweating in the chest.  He took some Tums with no relief then eventually he did see my partner who gave him nitroglycerin after that he had one episode of chest pain that happened when he was sitting he took nitroglycerin nitroglycerin relieved the pain.  He he walks and climbs stairs he gets short of breath during those but no typical chest pain tightness squeezing pressure burning chest.  He was already scheduled to have a cardiac catheterization but there was some mixup with scheduling he was very upset about this entire scenario therefore I invited him to the office to talk about this.  There was some talks about potentially having CT angiogram apparently insurance company approved cardiac catheterization with no CT Angie.  We had a long discussion about the situation.  He gets atypical chest pain but multiple risk factors for coronary artery disease and history of luminal disease in 2011 I told him that in my opinion the best way to look into it will be to do cardiac catheterization I explained procedure to him including all risk benefits as well as alternatives.  His wife was present during the visit in the office.  In the meantime I will give him prescription for Naprosyn 500 mg twice daily he does have  nitroglycerin knows how to use it.  Past Medical History:  Diagnosis Date  . CAD (coronary artery disease)    Minimal plaque on catheterization Nov 2011; EF normal by history  . Chest discomfort 07/18/2010   With exertion  . Depression with anxiety   . Diabetes mellitus without complication ([place])   . Dyslipidemia    x years  . GERD (gastroesophageal reflux disease)   . Heart murmur   . Hyperlipidemia   . Hypertension    x 10 years  . Nocturnal hypoxia 09/14/2013  . Obesity, unspecified   . Smoker    x 20 years, 1 1/2 ppd, quit 7 years ago  . SOB (shortness of breath) 07/18/2010    Past Surgical History:  Procedure Laterality Date  . CHOLECYSTECTOMY    . FINGER SURGERY     right hand,ring finger  . HERNIA REPAIR    . VENTRAL HERNIA REPAIR  06/2010   x3    Current Medications: Current Meds  Medication Sig  . aspirin 81 MG tablet Take 81 mg by mouth daily.    Marland Kitchen BAYER CONTOUR NEXT TEST test strip Use as instructed  . cyclobenzaprine (FLEXERIL) 10 MG tablet Take 1 tablet (10 mg total) by mouth 3 (three) times daily as needed for muscle spasms. (Patient taking differently: Take 10 mg by mouth as needed for muscle spasms. )  . empagliflozin (JARDIANCE) 25 MG TABS tablet Take 25 mg by  mouth daily.  . fenofibrate 54 MG tablet Take 1 tablet (54 mg total) by mouth daily.  Marland Kitchen gabapentin (NEURONTIN) 300 MG capsule TAKE (1) CAPSULE THREE TIMES DAILY. (Patient taking differently: Take 300 mg by mouth 3 (three) times daily. TAKE (1) CAPSULE THREE TIMES DAILY.)  . meloxicam (MOBIC) 15 MG tablet Take 1 tablet (15 mg total) by mouth daily. (if needed for joint pain) (Patient taking differently: Take 7.5 mg by mouth daily. )  . nitroGLYCERIN (NITROSTAT) 0.4 MG SL tablet Place 1 tablet (0.4 mg total) under the tongue every 5 (five) minutes as needed for chest pain.  Marland Kitchen olmesartan-hydrochlorothiazide (BENICAR HCT) 20-12.5 MG tablet Take 1 tablet by mouth daily.  Glory Rosebush DELICA LANCETS 20N  MISC CHECK BLOOD SUGAR 2 TIMES A DAY  . pantoprazole (PROTONIX) 40 MG tablet Take 1 tablet (40 mg total) by mouth daily.     Allergies:   Advicor [niacin-lovastatin er]; Altace [ramipril]; Bextra [valdecoxib]; Bupropion hcl; Janumet [sitagliptin-metformin hcl]; Niacin; Rofecoxib; Sulfamethoxazole-trimethoprim; and Sulfonamide derivatives   Social History   Socioeconomic History  . Marital status: Married    Spouse name: Not on file  . Number of children: Not on file  . Years of education: Not on file  . Highest education level: Not on file  Occupational History  . Not on file  Social Needs  . Financial resource strain: Not on file  . Food insecurity:    Worry: Not on file    Inability: Not on file  . Transportation needs:    Medical: Not on file    Non-medical: Not on file  Tobacco Use  . Smoking status: Former Smoker    Packs/day: 1.50    Years: 20.00    Pack years: 30.00    Types: Cigarettes    Last attempt to quit: 08/20/2002    Years since quitting: 15.9  . Smokeless tobacco: Never Used  Substance and Sexual Activity  . Alcohol use: No    Alcohol/week: 0.0 standard drinks  . Drug use: No  . Sexual activity: Not on file  Lifestyle  . Physical activity:    Days per week: Not on file    Minutes per session: Not on file  . Stress: Not on file  Relationships  . Social connections:    Talks on phone: Not on file    Gets together: Not on file    Attends religious service: Not on file    Active member of club or organization: Not on file    Attends meetings of clubs or organizations: Not on file    Relationship status: Not on file  Other Topics Concern  . Not on file  Social History Narrative   Married with 1 daughter     Family History: The patient's family history includes Breast cancer in his paternal aunt; Coronary artery disease in his other; Diabetes in his mother; Heart disease in his father and paternal uncle; Hyperlipidemia in his father and mother;  Hypertension in his father and mother; Kidney disease in his father; Testicular cancer in his paternal uncle. There is no history of Colon cancer, Esophageal cancer, Stomach cancer, or Rectal cancer. ROS:   Please see the history of present illness.    All 14 point review of systems negative except as described per history of present illness  EKGs/Labs/Other Studies Reviewed:      Recent Labs: 10/01/2017: TSH 1.320 01/15/2018: ALT 44 07/04/2018: BUN 21; Creatinine, Ser 1.19; Hemoglobin 16.7; Platelets 245; Potassium 4.3; Sodium 143  Recent Lipid Panel    Component Value Date/Time   CHOL 165 10/01/2017 1457   TRIG 161 (H) 10/01/2017 1457   TRIG 472 (H) 09/14/2013 0854   HDL 33 (L) 10/01/2017 1457   HDL 35 (L) 09/14/2013 0854   CHOLHDL 5.0 10/01/2017 1457   LDLCALC 100 (H) 10/01/2017 1457   LDLCALC NOTES: 09/14/2013 0854    Physical Exam:    VS:  BP 126/78   Pulse 90   Ht 5\' 9"  (1.753 m)   Wt 258 lb 12.8 oz (117.4 kg)   SpO2 93%   BMI 38.22 kg/m     Wt Readings from Last 3 Encounters:  07/14/18 258 lb 12.8 oz (117.4 kg)  07/04/18 256 lb (116.1 kg)  06/25/18 254 lb 3.2 oz (115.3 kg)     GEN:  Well nourished, well developed in no acute distress HEENT: Normal NECK: No JVD; No carotid bruits LYMPHATICS: No lymphadenopathy CARDIAC: RRR, no murmurs, no rubs, no gallops RESPIRATORY:  Clear to auscultation without rales, wheezing or rhonchi  ABDOMEN: Soft, non-tender, non-distended MUSCULOSKELETAL:  No edema; No deformity  SKIN: Warm and dry LOWER EXTREMITIES: no swelling NEUROLOGIC:  Alert and oriented x 3 PSYCHIATRIC:  Normal affect   ASSESSMENT:    1. Angina pectoris ([place])   2. Essential hypertension   3. Diabetes mellitus due to underlying condition with unspecified complications ([place])   4. Morbid obesity ([place])   5. Mixed hyperlipidemia    PLAN:    In order of problems listed above:  1. Angina pectoris.  Plan as outlined above he will be scheduled to have a  cardiac catheterization.  Ranolazine 500 nitroglycerin will be given.  He is already on aspirin which I will continue. 2. Essential hypertension blood pressure well controlled continue present management. 3. Diabetes doing well from that point review 4. Morbid obesity.  Will start exercise program after we do cardiac catheterization a better understanding of his coronary artery disease issue. 5. Dyslipidemia with intolerance to multiple statin.  In the future we will consider PCSK9 agent.   Medication Adjustments/Labs and Tests Ordered: Current medicines are reviewed at length with the patient today.  Concerns regarding medicines are outlined above.  No orders of the defined types were placed in this encounter.  Medication changes: No orders of the defined types were placed in this encounter.   Signed, Park Liter, MD, [facility] 07/14/2018 5:06 PM    [facility]

## 2018-07-15 ENCOUNTER — Telehealth: Payer: Self-pay | Admitting: Emergency Medicine

## 2018-07-15 NOTE — Telephone Encounter (Signed)
Informed wife of patient's catheterization scheduled for December 3rd. 2019 at [facility]. Patient's wife informed he needs to be there at 7am. Instructions were given yesterday at discharge, advised patient's wife to call back with any questions, she verbally understood.

## 2018-07-16 ENCOUNTER — Telehealth: Payer: Self-pay | Admitting: *Deleted

## 2018-07-16 NOTE — Telephone Encounter (Signed)
Pt contacted pre-catheterization scheduled at [facility] for: Tuesday July 22, 2018 9 AM Verified arrival time and place: [place] Entrance A at: 7 AM  No solid food after midnight prior to cath, clear liquids until 5 AM day of procedure. Contrast allergy: no  Hold: Jardiance-AM of procedure. Olmesartan-HCT-AM of procedure.  Except hold medications AM meds can be  taken pre-cath with sip of water including: ASA 81 mg  Confirmed patient has responsible person to drive home post procedure and for 24 hours after you arrive home: yes

## 2018-07-20 DIAGNOSIS — I200 Unstable angina: Secondary | ICD-10-CM

## 2018-07-20 HISTORY — DX: Unstable angina: I20.0

## 2018-07-21 ENCOUNTER — Encounter (HOSPITAL_COMMUNITY): Payer: Self-pay | Admitting: Cardiology

## 2018-07-22 ENCOUNTER — Encounter (HOSPITAL_COMMUNITY): Payer: Self-pay | Admitting: Cardiology

## 2018-07-22 ENCOUNTER — Encounter (HOSPITAL_COMMUNITY)
Admission: RE | Disposition: A | Discharge status: Home / Self Care | Payer: Self-pay | Source: Ambulatory Visit | Attending: Cardiology

## 2018-07-22 ENCOUNTER — Ambulatory Visit (HOSPITAL_BASED_OUTPATIENT_CLINIC_OR_DEPARTMENT_OTHER): Payer: Managed Care, Other (non HMO)

## 2018-07-22 ENCOUNTER — Other Ambulatory Visit: Payer: Self-pay

## 2018-07-22 ENCOUNTER — Ambulatory Visit (HOSPITAL_COMMUNITY)
Admission: RE | Admit: 2018-07-22 | Discharge: 2018-07-23 | Disposition: A | Discharge status: Home / Self Care | Payer: Managed Care, Other (non HMO) | Source: Ambulatory Visit | Attending: Cardiology | Admitting: Cardiology

## 2018-07-22 DIAGNOSIS — I2510 Atherosclerotic heart disease of native coronary artery without angina pectoris: Secondary | ICD-10-CM | POA: Diagnosis present

## 2018-07-22 DIAGNOSIS — E782 Mixed hyperlipidemia: Secondary | ICD-10-CM

## 2018-07-22 DIAGNOSIS — I110 Hypertensive heart disease with heart failure: Secondary | ICD-10-CM | POA: Insufficient documentation

## 2018-07-22 DIAGNOSIS — Z6838 Body mass index (BMI) 38.0-38.9, adult: Secondary | ICD-10-CM | POA: Insufficient documentation

## 2018-07-22 DIAGNOSIS — Z9889 Other specified postprocedural states: Secondary | ICD-10-CM | POA: Diagnosis not present

## 2018-07-22 DIAGNOSIS — Z882 Allergy status to sulfonamides status: Secondary | ICD-10-CM | POA: Insufficient documentation

## 2018-07-22 DIAGNOSIS — Z791 Long term (current) use of non-steroidal anti-inflammatories (NSAID): Secondary | ICD-10-CM | POA: Diagnosis not present

## 2018-07-22 DIAGNOSIS — I209 Angina pectoris, unspecified: Secondary | ICD-10-CM

## 2018-07-22 DIAGNOSIS — Z87891 Personal history of nicotine dependence: Secondary | ICD-10-CM | POA: Diagnosis not present

## 2018-07-22 DIAGNOSIS — I420 Dilated cardiomyopathy: Secondary | ICD-10-CM | POA: Insufficient documentation

## 2018-07-22 DIAGNOSIS — I25119 Atherosclerotic heart disease of native coronary artery with unspecified angina pectoris: Secondary | ICD-10-CM | POA: Insufficient documentation

## 2018-07-22 DIAGNOSIS — Z9049 Acquired absence of other specified parts of digestive tract: Secondary | ICD-10-CM | POA: Insufficient documentation

## 2018-07-22 DIAGNOSIS — Z9861 Coronary angioplasty status: Secondary | ICD-10-CM | POA: Diagnosis present

## 2018-07-22 DIAGNOSIS — Z881 Allergy status to other antibiotic agents status: Secondary | ICD-10-CM | POA: Diagnosis not present

## 2018-07-22 DIAGNOSIS — Z79899 Other long term (current) drug therapy: Secondary | ICD-10-CM | POA: Insufficient documentation

## 2018-07-22 DIAGNOSIS — E1159 Type 2 diabetes mellitus with other circulatory complications: Secondary | ICD-10-CM

## 2018-07-22 DIAGNOSIS — I2582 Chronic total occlusion of coronary artery: Secondary | ICD-10-CM | POA: Insufficient documentation

## 2018-07-22 DIAGNOSIS — R0602 Shortness of breath: Secondary | ICD-10-CM | POA: Diagnosis not present

## 2018-07-22 DIAGNOSIS — I25110 Atherosclerotic heart disease of native coronary artery with unstable angina pectoris: Secondary | ICD-10-CM

## 2018-07-22 DIAGNOSIS — Z7982 Long term (current) use of aspirin: Secondary | ICD-10-CM | POA: Diagnosis not present

## 2018-07-22 DIAGNOSIS — E118 Type 2 diabetes mellitus with unspecified complications: Secondary | ICD-10-CM | POA: Diagnosis present

## 2018-07-22 DIAGNOSIS — E119 Type 2 diabetes mellitus without complications: Secondary | ICD-10-CM | POA: Insufficient documentation

## 2018-07-22 DIAGNOSIS — I5030 Unspecified diastolic (congestive) heart failure: Secondary | ICD-10-CM | POA: Insufficient documentation

## 2018-07-22 DIAGNOSIS — K219 Gastro-esophageal reflux disease without esophagitis: Secondary | ICD-10-CM | POA: Diagnosis not present

## 2018-07-22 DIAGNOSIS — Z955 Presence of coronary angioplasty implant and graft: Secondary | ICD-10-CM

## 2018-07-22 DIAGNOSIS — E088 Diabetes mellitus due to underlying condition with unspecified complications: Secondary | ICD-10-CM

## 2018-07-22 DIAGNOSIS — Z833 Family history of diabetes mellitus: Secondary | ICD-10-CM | POA: Diagnosis not present

## 2018-07-22 DIAGNOSIS — Z8249 Family history of ischemic heart disease and other diseases of the circulatory system: Secondary | ICD-10-CM | POA: Diagnosis not present

## 2018-07-22 DIAGNOSIS — I10 Essential (primary) hypertension: Secondary | ICD-10-CM

## 2018-07-22 DIAGNOSIS — Z7984 Long term (current) use of oral hypoglycemic drugs: Secondary | ICD-10-CM | POA: Insufficient documentation

## 2018-07-22 DIAGNOSIS — Z888 Allergy status to other drugs, medicaments and biological substances status: Secondary | ICD-10-CM | POA: Insufficient documentation

## 2018-07-22 HISTORY — PX: TRANSTHORACIC ECHOCARDIOGRAM: SHX275

## 2018-07-22 HISTORY — PX: LEFT HEART CATH AND CORONARY ANGIOGRAPHY: CATH118249

## 2018-07-22 HISTORY — PX: CORONARY STENT INTERVENTION: CATH118234

## 2018-07-22 HISTORY — DX: Atherosclerotic heart disease of native coronary artery without angina pectoris: I25.10

## 2018-07-22 HISTORY — DX: Coronary angioplasty status: Z98.61

## 2018-07-22 LAB — ECHOCARDIOGRAM COMPLETE
Height: 69 in
Weight: 4080 oz

## 2018-07-22 LAB — GLUCOSE, CAPILLARY
GLUCOSE-CAPILLARY: 127 mg/dL — AB (ref 70–99)
GLUCOSE-CAPILLARY: 115 mg/dL — AB (ref 70–99)
Glucose-Capillary: 89 mg/dL (ref 70–99)
Glucose-Capillary: 90 mg/dL (ref 70–99)

## 2018-07-22 LAB — POCT ACTIVATED CLOTTING TIME
Activated Clotting Time: 224 seconds
Activated Clotting Time: 257 seconds
Activated Clotting Time: 439 seconds

## 2018-07-22 SURGERY — LEFT HEART CATH AND CORONARY ANGIOGRAPHY
Anesthesia: LOCAL

## 2018-07-22 MED ORDER — IOHEXOL 350 MG/ML SOLN
INTRAVENOUS | Status: DC | PRN
  Administered 2018-07-22: 210 mL via INTRACARDIAC

## 2018-07-22 MED ORDER — CYCLOBENZAPRINE HCL 10 MG PO TABS: 10.0000 mg | ORAL_TABLET | ORAL | Status: DC | PRN

## 2018-07-22 MED ORDER — LABETALOL HCL 5 MG/ML IV SOLN: 10.0000 mg | INTRAVENOUS | Status: AC | PRN

## 2018-07-22 MED ORDER — MIDAZOLAM HCL 2 MG/2ML IJ SOLN
INTRAMUSCULAR | Status: AC
  Filled 2018-07-22: qty 2

## 2018-07-22 MED ORDER — SODIUM CHLORIDE 0.9 % WEIGHT BASED INFUSION: 1.0000 mL/kg/h | INTRAVENOUS | Status: DC

## 2018-07-22 MED ORDER — SODIUM CHLORIDE 0.9 % IV SOLN: INTRAVENOUS | Status: AC

## 2018-07-22 MED ORDER — ASPIRIN EC 81 MG PO TBEC
81.0000 mg | DELAYED_RELEASE_TABLET | Freq: Every day | ORAL | Status: DC
  Administered 2018-07-23: 10:00:00 81 mg via ORAL
  Filled 2018-07-22: qty 1

## 2018-07-22 MED ORDER — LIDOCAINE HCL (PF) 1 % IJ SOLN
INTRAMUSCULAR | Status: DC | PRN
  Administered 2018-07-22: 2 mL

## 2018-07-22 MED ORDER — NITROGLYCERIN 1 MG/10 ML FOR IR/CATH LAB
INTRA_ARTERIAL | Status: AC
  Filled 2018-07-22: qty 10

## 2018-07-22 MED ORDER — MORPHINE SULFATE (PF) 2 MG/ML IV SOLN: 2.0000 mg | INTRAVENOUS | Status: DC | PRN

## 2018-07-22 MED ORDER — FENTANYL CITRATE (PF) 100 MCG/2ML IJ SOLN
INTRAMUSCULAR | Status: DC | PRN
  Administered 2018-07-22: 50 ug via INTRAVENOUS
  Administered 2018-07-22: 25 ug via INTRAVENOUS

## 2018-07-22 MED ORDER — SODIUM CHLORIDE 0.9 % IV SOLN
INTRAVENOUS | Status: AC | PRN
  Administered 2018-07-22: 250 mL via INTRAVENOUS

## 2018-07-22 MED ORDER — TIROFIBAN HCL IV 12.5 MG/250 ML
INTRAVENOUS | Status: AC
  Filled 2018-07-22: qty 250

## 2018-07-22 MED ORDER — NITROGLYCERIN 0.4 MG SL SUBL: 0.4000 mg | SUBLINGUAL_TABLET | SUBLINGUAL | Status: DC | PRN

## 2018-07-22 MED ORDER — MIDAZOLAM HCL 2 MG/2ML IJ SOLN
INTRAMUSCULAR | Status: DC | PRN
  Administered 2018-07-22: 2 mg via INTRAVENOUS

## 2018-07-22 MED ORDER — HEPARIN SODIUM (PORCINE) 1000 UNIT/ML IJ SOLN
INTRAMUSCULAR | Status: DC | PRN
  Administered 2018-07-22: 4000 [IU] via INTRAVENOUS
  Administered 2018-07-22: 2000 [IU] via INTRAVENOUS
  Administered 2018-07-22 (×2): 6000 [IU] via INTRAVENOUS

## 2018-07-22 MED ORDER — ANGIOPLASTY BOOK
Freq: Once | Status: DC
  Filled 2018-07-22: qty 1

## 2018-07-22 MED ORDER — SODIUM CHLORIDE 0.9% FLUSH: 3.0000 mL | INTRAVENOUS | Status: DC | PRN

## 2018-07-22 MED ORDER — CANAGLIFLOZIN 100 MG PO TABS
100.0000 mg | ORAL_TABLET | Freq: Every day | ORAL | Status: DC
  Administered 2018-07-23: 08:00:00 100 mg via ORAL
  Filled 2018-07-22: qty 1

## 2018-07-22 MED ORDER — HYDROCHLOROTHIAZIDE 12.5 MG PO CAPS
12.5000 mg | ORAL_CAPSULE | Freq: Every day | ORAL | Status: DC
  Administered 2018-07-22 – 2018-07-23 (×2): 12.5 mg via ORAL
  Filled 2018-07-22 (×2): qty 1

## 2018-07-22 MED ORDER — IRBESARTAN 75 MG PO TABS
150.0000 mg | ORAL_TABLET | Freq: Every day | ORAL | Status: DC
  Administered 2018-07-22 – 2018-07-23 (×2): 150 mg via ORAL
  Filled 2018-07-22 (×3): qty 2

## 2018-07-22 MED ORDER — VERAPAMIL HCL 2.5 MG/ML IV SOLN
INTRAVENOUS | Status: AC
  Filled 2018-07-22: qty 2

## 2018-07-22 MED ORDER — PRASUGREL HCL 10 MG PO TABS
ORAL_TABLET | ORAL | Status: AC
  Filled 2018-07-22: qty 6

## 2018-07-22 MED ORDER — HEPARIN (PORCINE) IN NACL 1000-0.9 UT/500ML-% IV SOLN
INTRAVENOUS | Status: AC
  Filled 2018-07-22: qty 1000

## 2018-07-22 MED ORDER — TIROFIBAN HCL IV 12.5 MG/250 ML
INTRAVENOUS | Status: AC | PRN
  Administered 2018-07-22: 0.15 ug/kg/min via INTRAVENOUS

## 2018-07-22 MED ORDER — HEPARIN (PORCINE) IN NACL 1000-0.9 UT/500ML-% IV SOLN
INTRAVENOUS | Status: DC | PRN
  Administered 2018-07-22 (×2): 500 mL

## 2018-07-22 MED ORDER — SODIUM CHLORIDE 0.9% FLUSH: 3.0000 mL | Freq: Two times a day (BID) | INTRAVENOUS | Status: DC

## 2018-07-22 MED ORDER — SODIUM CHLORIDE 0.9 % IV SOLN: 250.0000 mL | INTRAVENOUS | Status: DC | PRN

## 2018-07-22 MED ORDER — HEPARIN SODIUM (PORCINE) 1000 UNIT/ML IJ SOLN
INTRAMUSCULAR | Status: AC
  Filled 2018-07-22: qty 1

## 2018-07-22 MED ORDER — LIDOCAINE HCL (PF) 1 % IJ SOLN
INTRAMUSCULAR | Status: AC
  Filled 2018-07-22: qty 30

## 2018-07-22 MED ORDER — ACETAMINOPHEN 325 MG PO TABS: 650.0000 mg | ORAL_TABLET | ORAL | Status: DC | PRN

## 2018-07-22 MED ORDER — SODIUM CHLORIDE 0.9% FLUSH
3.0000 mL | Freq: Two times a day (BID) | INTRAVENOUS | Status: DC
  Administered 2018-07-22: 18:00:00 3 mL via INTRAVENOUS

## 2018-07-22 MED ORDER — METOPROLOL TARTRATE 12.5 MG HALF TABLET
12.5000 mg | ORAL_TABLET | Freq: Two times a day (BID) | ORAL | Status: DC
  Administered 2018-07-22 – 2018-07-23 (×3): 12.5 mg via ORAL
  Filled 2018-07-22 (×3): qty 1

## 2018-07-22 MED ORDER — PANTOPRAZOLE SODIUM 40 MG PO TBEC: 40.0000 mg | DELAYED_RELEASE_TABLET | Freq: Every day | ORAL | Status: DC

## 2018-07-22 MED ORDER — PRASUGREL HCL 10 MG PO TABS
ORAL_TABLET | ORAL | Status: DC | PRN
  Administered 2018-07-22: 60 mg via ORAL

## 2018-07-22 MED ORDER — ASPIRIN 81 MG PO CHEW: 81.0000 mg | CHEWABLE_TABLET | ORAL | Status: DC

## 2018-07-22 MED ORDER — TIROFIBAN (AGGRASTAT) BOLUS VIA INFUSION
INTRAVENOUS | Status: DC | PRN
  Administered 2018-07-22: 2892.5 ug via INTRAVENOUS

## 2018-07-22 MED ORDER — OLMESARTAN MEDOXOMIL-HCTZ 20-12.5 MG PO TABS: 1.0000 | ORAL_TABLET | Freq: Every day | ORAL | Status: DC

## 2018-07-22 MED ORDER — VERAPAMIL HCL 2.5 MG/ML IV SOLN
INTRAVENOUS | Status: DC | PRN
  Administered 2018-07-22: 100 ug via INTRACORONARY

## 2018-07-22 MED ORDER — TIROFIBAN HCL IV 12.5 MG/250 ML
0.1500 ug/kg/min | INTRAVENOUS | Status: AC
  Administered 2018-07-22: 0.15 ug/kg/min via INTRAVENOUS
  Filled 2018-07-22: qty 250

## 2018-07-22 MED ORDER — FENOFIBRATE 54 MG PO TABS
54.0000 mg | ORAL_TABLET | Freq: Every day | ORAL | Status: DC
  Administered 2018-07-23: 54 mg via ORAL
  Filled 2018-07-22: qty 1

## 2018-07-22 MED ORDER — PRASUGREL HCL 10 MG PO TABS
10.0000 mg | ORAL_TABLET | Freq: Every day | ORAL | Status: DC
  Administered 2018-07-23: 10:00:00 10 mg via ORAL
  Filled 2018-07-22: qty 1

## 2018-07-22 MED ORDER — HYDRALAZINE HCL 20 MG/ML IJ SOLN: 5.0000 mg | INTRAMUSCULAR | Status: AC | PRN

## 2018-07-22 MED ORDER — FENTANYL CITRATE (PF) 100 MCG/2ML IJ SOLN
INTRAMUSCULAR | Status: AC
  Filled 2018-07-22: qty 2

## 2018-07-22 MED ORDER — NITROGLYCERIN 1 MG/10 ML FOR IR/CATH LAB
INTRA_ARTERIAL | Status: DC | PRN
  Administered 2018-07-22 (×2): 200 ug via INTRACORONARY

## 2018-07-22 MED ORDER — SODIUM CHLORIDE 0.9 % WEIGHT BASED INFUSION
3.0000 mL/kg/h | INTRAVENOUS | Status: DC
  Administered 2018-07-22: 3 mL/kg/h via INTRAVENOUS

## 2018-07-22 MED ORDER — GABAPENTIN 300 MG PO CAPS
300.0000 mg | ORAL_CAPSULE | Freq: Three times a day (TID) | ORAL | Status: DC
  Administered 2018-07-22 – 2018-07-23 (×3): 300 mg via ORAL
  Filled 2018-07-22 (×3): qty 1

## 2018-07-22 MED ORDER — VERAPAMIL HCL 2.5 MG/ML IV SOLN
INTRAVENOUS | Status: DC | PRN
  Administered 2018-07-22: 10 mL via INTRA_ARTERIAL

## 2018-07-22 MED ORDER — RANOLAZINE ER 500 MG PO TB12
500.0000 mg | ORAL_TABLET | Freq: Two times a day (BID) | ORAL | Status: DC
  Administered 2018-07-22 – 2018-07-23 (×2): 500 mg via ORAL
  Filled 2018-07-22 (×3): qty 1

## 2018-07-22 MED ORDER — ONDANSETRON HCL 4 MG/2ML IJ SOLN: 4.0000 mg | Freq: Four times a day (QID) | INTRAMUSCULAR | Status: DC | PRN

## 2018-07-22 SURGICAL SUPPLY — 24 items
BALLN SAPPHIRE 2.5X12 (BALLOONS) ×2
BALLN SAPPHIRE ~~LOC~~ 3.5X15 (BALLOONS) ×1 IMPLANT
BALLN SAPPHIRE ~~LOC~~ 4.0X18 (BALLOONS) ×1 IMPLANT
BALLOON SAPPHIRE 2.5X12 (BALLOONS) IMPLANT
CATH OPTITORQUE TIG 4.0 5F (CATHETERS) ×1 IMPLANT
CATH VISTA GUIDE 6FR JR4 (CATHETERS) ×1 IMPLANT
CATH VISTA GUIDE 6FR XB3.5 (CATHETERS) ×1 IMPLANT
DEVICE RAD COMP TR BAND LRG (VASCULAR PRODUCTS) ×1 IMPLANT
GLIDESHEATH SLEND SS 6F .021 (SHEATH) ×1 IMPLANT
GUIDEWIRE INQWIRE 1.5J.035X260 (WIRE) IMPLANT
INQWIRE 1.5J .035X260CM (WIRE) ×2
KIT ENCORE 26 ADVANTAGE (KITS) ×1 IMPLANT
KIT HEART LEFT (KITS) ×2 IMPLANT
PACK CARDIAC CATHETERIZATION (CUSTOM PROCEDURE TRAY) ×2 IMPLANT
SHEATH PROBE COVER 6X72 (BAG) ×1 IMPLANT
STENT SIERRA 3.00 X 18 MM (Permanent Stent) ×1 IMPLANT
STENT SIERRA 3.50 X 23 MM (Permanent Stent) ×1 IMPLANT
TRANSDUCER W/STOPCOCK (MISCELLANEOUS) ×2 IMPLANT
TUBING CIL FLEX 10 FLL-RA (TUBING) ×2 IMPLANT
WIRE ASAHI PROWATER 180CM (WIRE) ×1 IMPLANT
WIRE ASAHI SION 190X3X12 .014 (WIRE) ×1 IMPLANT
WIRE MICROINTRODUCER 60CM (WIRE) ×1 IMPLANT
WIRE MINAMO 190 (WIRE) ×1 IMPLANT
WIRE RUNTHROUGH .014X180CM (WIRE) ×1 IMPLANT

## 2018-07-22 NOTE — Interval H&P Note (Signed)
History and Physical Interval Note:  07/22/2018 9:40 AM  David Black  has presented today for surgery, with the diagnosis of ANGINA WITH REST AND EXERTION (class 2-3 the various methods of treatment have been discussed with the patient and family. After consideration of risks, benefits and other options for treatment, the patient has consented to  Procedure(s): LEFT HEART CATH AND CORONARY ANGIOGRAPHY (N/A) with possible PERCUTANEOUS CORONARY INTERVENTION as a surgical intervention .  The patient's history has been reviewed, patient examined, no change in status, stable for surgery.  I have reviewed the patient's chart and labs.  Questions were answered to the patient's satisfaction.    Cath Lab Visit (complete for each Cath Lab visit)  Clinical Evaluation Leading to the Procedure:   ACS: No.  Non-ACS:    Anginal Classification: CCS III  Anti-ischemic medical therapy: No Therapy  Non-Invasive Test Results: No non-invasive testing performed  Prior CABG: No previous CABG   Glenetta Hew

## 2018-07-22 NOTE — Progress Notes (Signed)
  Echocardiogram 2D Echocardiogram has been performed.  David Black 07/22/2018, 4:44 PM

## 2018-07-22 NOTE — Progress Notes (Signed)
Report and Care received from [facility]. Patient resting in bed comfortable at this time. Rt radial site soft with no active bleeding or hematoma noted. Patient instructed to call out if need assistance. Will continue to monitor patient.

## 2018-07-22 NOTE — Care Management (Signed)
#   11.   S/W IRLANDA  @ [facility] SOLUTION RX # [phone]   1. ELLIENT  10 MG DAILY COVER- YES CO-PAY- $ 50.00 TIER- 3 DRUG PRIOR APPROVAL- NO  2. PRASUGREL 10 MG  DAILY COVER- YES CO-PAY- $ 41.00 TIER- 2 DRUG PRIOR APPROVAL- NO  NO DEDUCTIBLE  PREFERRED PHARMACY : YES - [facility]

## 2018-07-22 NOTE — Progress Notes (Signed)
Report taken from [facility] Large

## 2018-07-23 ENCOUNTER — Telehealth: Payer: Self-pay | Admitting: Emergency Medicine

## 2018-07-23 DIAGNOSIS — I25119 Atherosclerotic heart disease of native coronary artery with unspecified angina pectoris: Secondary | ICD-10-CM | POA: Diagnosis not present

## 2018-07-23 DIAGNOSIS — I25110 Atherosclerotic heart disease of native coronary artery with unstable angina pectoris: Secondary | ICD-10-CM | POA: Diagnosis not present

## 2018-07-23 DIAGNOSIS — I209 Angina pectoris, unspecified: Secondary | ICD-10-CM | POA: Diagnosis not present

## 2018-07-23 DIAGNOSIS — I10 Essential (primary) hypertension: Secondary | ICD-10-CM

## 2018-07-23 DIAGNOSIS — E782 Mixed hyperlipidemia: Secondary | ICD-10-CM | POA: Diagnosis not present

## 2018-07-23 LAB — BASIC METABOLIC PANEL
Anion gap: 11 (ref 5–15)
BUN: 16 mg/dL (ref 6–20)
CO2: 25 mmol/L (ref 22–32)
Calcium: 9.6 mg/dL (ref 8.9–10.3)
Chloride: 104 mmol/L (ref 98–111)
Creatinine, Ser: 1.34 mg/dL — ABNORMAL HIGH (ref 0.61–1.24)
GFR calc Af Amer: >60 mL/min (ref >60)
GFR calc non Af Amer: 59 mL/min — ABNORMAL LOW (ref >60)
Glucose, Bld: 121 mg/dL — ABNORMAL HIGH (ref 70–99)
Potassium: 3.9 mmol/L (ref 3.5–5.1)
SODIUM: 140 mmol/L (ref 135–145)

## 2018-07-23 LAB — CBC
HCT: 47.7 % (ref 39.0–52.0)
HEMOGLOBIN: 15.6 g/dL (ref 13.0–17.0)
MCH: 29.8 pg (ref 26.0–34.0)
MCHC: 32.7 g/dL (ref 30.0–36.0)
MCV: 91 fL (ref 80.0–100.0)
Platelets: 227 10*3/uL (ref 150–400)
RBC: 5.24 MIL/uL (ref 4.22–5.81)
RDW: 12.9 % (ref 11.5–15.5)
WBC: 7.7 10*3/uL (ref 4.0–10.5)
nRBC: 0 % (ref 0.0–0.2)

## 2018-07-23 LAB — GLUCOSE, CAPILLARY: Glucose-Capillary: 114 mg/dL — ABNORMAL HIGH (ref 70–99)

## 2018-07-23 MED ORDER — PRASUGREL HCL 10 MG PO TABS: 10.0000 mg | ORAL_TABLET | Freq: Every day | ORAL | 11 refills | Status: DC

## 2018-07-23 MED ORDER — METOPROLOL TARTRATE 25 MG PO TABS: 12.5000 mg | ORAL_TABLET | Freq: Two times a day (BID) | ORAL | 6 refills | Status: DC

## 2018-07-23 MED ORDER — ATORVASTATIN CALCIUM 10 MG PO TABS: 10.0000 mg | ORAL_TABLET | Freq: Every day | ORAL | 3 refills | Status: DC

## 2018-07-23 NOTE — Discharge Summary (Addendum)
Discharge Summary    Patient ID: David Black MRN: [id number]; DOB: [birth year]  Admit date: 07/22/2018 Discharge date: 07/23/2018  Primary Care Provider: Janora Norlander, DO  Primary Cardiologist: Jenne Campus, MD  Discharge Diagnoses    Principal Problem:   Angina pectoris [facility]) Active Problems:   Essential hypertension   Type 2 diabetes mellitus with complication, without long-term current use of insulin ([place])   Coronary artery disease involving native coronary artery of native heart with unstable angina pectoris (H[facility])   HLD   Allergies Allergies  Allergen Reactions  . Advicor [Niacin-Lovastatin Er] Other (See Comments)    unknown  . Altace [Ramipril] Other (See Comments)    cough  . Bextra [Valdecoxib] Swelling  . Bupropion Hcl Other (See Comments)    Unknown  . Janumet [Sitagliptin-Metformin Hcl] Other (See Comments)    unknown  . Niacin Other (See Comments)    unknown  . Rofecoxib Swelling  . Sulfamethoxazole-Trimethoprim Other (See Comments)    unknown  . Sulfonamide Derivatives Other (See Comments)    unknown    Diagnostic Studies/Procedures    CORONARY STENT INTERVENTION  LEFT HEART CATH AND CORONARY ANGIOGRAPHY  Conclusion     Lesion #1: Prox Cx lesion is 75% stenosed. Prox Cx to Mid Cx lesion is 100% stenosed. -Appears to be subacute total occlusion  A drug-eluting stent was successfully placed using a STENT SIERRA 3.50 X 23 MM -postdilated to 4.1 mm  Post intervention, there is a 0% residual stenosis.  Ost 2nd Mrg lesion is 100% stenosed -thromboembolic.  Balloon Dot[place] was performed using a BALLOON SAPPHIRE 2.5X12.  Post intervention, there is a 0% residual stenosis resulting in TIMI-3 flow down to the very apical portion of this vessel which is still on 100% occluded with thrombus embolism.  ----------------------------------------------  Lesion #2: Dist RCA lesion is 95% stenosed.  A drug-eluting stent was successfully  placed using a STENT SIERRA 3.00 X 18 MM -postdilated to 3.6 mm  Post intervention, there is a 0% residual stenosis.  -----------------------------------------  The left ventricular systolic function is normal. The left ventricular ejection fraction is 55-65% by visual estimate.  LV end diastolic pressure is normal.   SUMMARY:  Severe two-vessel disease with 100% subacute occlusion of the proximal circumflex and 95% stenosis of the distal RCA.  Successful DES PCI of the occluded Circumflex with a Xience Sierra DES 3.5 mm x 23 mm (4.1 mm).  Successful DES PCI of distal RCA with Xience Sierra DES 3.0 mm 18 mm (3.6 mm)  Apparently preserved LVEF with normal LVEDP.   RECOMMENDATIONS:  Overnight monitoring post complex PCI.  Anticipate discharge tomorrow to follow-up with Dr. Agustin Cree  Continue Aggrastat for 6 hours post PCI  Initiate aggressive risk factor modification - beta-blocker (have written for low-dose beta-blocker), and consider CV RR consultation for PCSK9 inhibitor given statin intolerance.  Would recommend uninterrupted aspirin plus Effient for 3 months, could stop aspirin at that time and continue Effient for minimum 1 year -preferably longer.     Echo 07/22/18 Study Conclusions  - Left ventricle: The cavity size was normal. Wall thickness was   normal. Systolic function was normal. The estimated ejection   fraction was in the range of 55% to 60%. Wall motion was normal;   there were no regional wall motion abnormalities. Doppler   parameters are consistent with abnormal left ventricular   relaxation (grade 1 diastolic dysfunction). - Right ventricle: The cavity size was mildly dilated.  Impressions:  - Normal  LV systolic function; mild diastolic dysfunction; mild   RVE.  History of Present Illness     David Black is a 57 y.o. male with hx of  Minimal non obstructive CAD by cath in 2011, HLD, DM, HTN, HLD with statin intolerance and prior smoker  presented for outpatient cath.   Recently noted anginal pain which relieved with nitro but not with Tumes. Started on Ranolazine 500mg  BID. Recommended cath.  Hospital Course     Consultants: None   Cath showed evere two-vessel disease with 100% subacute occlusion of the proximal circumflex and 95% stenosis of the distal RCA. Ost 2nd Mrg lesion is 100% stenosed -thromboembolic. S/p successful DES PCI of the occluded Circumflex with a Xience Sierra DES 3.5 mm x 23 mm (4.1 mm) and successful DES PCI of distal RCA with Xience Sierra DES 3.0 mm 18 mm (3.6 mm).  Apparently preserved LVEF with normal LVEDP. Echo showed normal LVEF with mild diastolic dysfunction. uninterrupted aspirin plus Effient for 3 months, could stop aspirin at that time and continue Effient for minimum 1 year -preferably longer. Hx of stain intolerance but he is willing to try Lipitor 10mg  daily. PCSK9 inhibitor as outpatient. Continue Renolazine 500mg  BID for now, long term per primary cardiologist. Started low dose BB.  Ambulated well. No complications.   She has been seen by Dr. Gwenlyn Found today and deemed ready for discharge home. All follow-up appointments have been scheduled. Discharge medications are listed below.   Discharge Vitals Blood pressure 107/74, pulse 64, temperature 98.5 F (36.9 C), temperature source Oral, resp. rate 19, height 5\' 9"  (1.753 m), weight 114.9 kg, SpO2 96 %.  Filed Weights   07/22/18 0710 07/23/18 0432  Weight: 115.7 kg 114.9 kg   Physical Exam  Constitutional: He is oriented to person, place, and time and well-developed, well-nourished, and in no distress.  HENT:  Head: Normocephalic and atraumatic.  Eyes: Pupils are equal, round, and reactive to light. Conjunctivae are normal.  Neck: Normal range of motion. Neck supple.  Cardiovascular: Normal rate and regular rhythm.  Right radial cath site without hematoma   Pulmonary/Chest: Breath sounds normal.  Abdominal: Soft. Bowel sounds are normal.   Musculoskeletal: Normal range of motion.  Neurological: He is alert and oriented to person, place, and time.  Skin: Skin is warm and dry.  Psychiatric: Affect normal.    Labs & Radiologic Studies    CBC Recent Labs    07/23/18 0403  WBC 7.7  HGB 15.6  HCT 47.7  MCV 91.0  PLT 157   Basic Metabolic Panel Recent Labs    07/23/18 0403  NA 140  K 3.9  CL 104  CO2 25  GLUCOSE 121*  BUN 16  CREATININE 1.34*  CALCIUM 9.6  _____________  Dg Chest 2 View  Result Date: 07/04/2018 CLINICAL DATA:  Heart catheterization clearance. Patient with history of angina and arm pain. EXAM: CHEST - 2 VIEW COMPARISON:  CT 03/19/2017.  Chest x-ray 07/25/2016. FINDINGS: Mediastinum hilar structures normal. Lungs are clear. Heart size normal. Degenerative change thoracic spine. Surgical clips right upper quadrant. Degenerative change thoracic spine. IMPRESSION: No acute cardiopulmonary disease. Electronically Signed   By: Marcello Moores  Register   On: 07/04/2018 17:26   Disposition   Pt is being discharged home today in good condition.  Follow-up Plans & Appointments    Follow-up Information    Park Liter, MD. Go on 08/08/2018.   Specialty:  Cardiology Why:  @4 :20pm for cath follow up  Contact information: [place] [phone]          Discharge Instructions    AMB Referral to Cardiac Rehabilitation - Phase II   Complete by:  As directed    Diagnosis:   Stable Angina Coronary Stents     Diet - low sodium heart healthy   Complete by:  As directed    Discharge instructions   Complete by:  As directed    No driving for 48 hours. No lifting over 5 lbs for 1 week. No sexual activity for 1 week. You may return to work on 07/28/18. Keep procedure site clean & dry. If you notice increased pain, swelling, bleeding or pus, call/return!  You may shower, but no soaking baths/hot tubs/pools for 1 week.   Increase activity slowly   Complete by:  As  directed       Discharge Medications   Allergies as of 07/23/2018      Reactions   Advicor [niacin-lovastatin Er] Other (See Comments)   unknown   Altace [ramipril] Other (See Comments)   cough   Bextra [valdecoxib] Swelling   Bupropion Hcl Other (See Comments)   Unknown   Janumet [sitagliptin-metformin Hcl] Other (See Comments)   unknown   Niacin Other (See Comments)   unknown   Rofecoxib Swelling   Sulfamethoxazole-trimethoprim Other (See Comments)   unknown   Sulfonamide Derivatives Other (See Comments)   unknown      Medication List    STOP taking these medications   meloxicam 15 MG tablet Commonly known as:  MOBIC     TAKE these medications   aspirin 81 MG tablet Take 81 mg by mouth daily.   atorvastatin 10 MG tablet Commonly known as:  LIPITOR Take 1 tablet (10 mg total) by mouth daily.   BAYER CONTOUR NEXT TEST test strip Generic drug:  glucose blood Use as instructed   cyclobenzaprine 10 MG tablet Commonly known as:  FLEXERIL Take 1 tablet (10 mg total) by mouth 3 (three) times daily as needed for muscle spasms. What changed:  when to take this   empagliflozin 25 MG Tabs tablet Commonly known as:  JARDIANCE Take 25 mg by mouth daily.   fenofibrate 54 MG tablet Take 1 tablet (54 mg total) by mouth daily.   gabapentin 300 MG capsule Commonly known as:  NEURONTIN TAKE (1) CAPSULE THREE TIMES DAILY. What changed:    how much to take  how to take this  when to take this   metoprolol tartrate 25 MG tablet Commonly known as:  LOPRESSOR Take 0.5 tablets (12.5 mg total) by mouth 2 (two) times daily.   nitroGLYCERIN 0.4 MG SL tablet Commonly known as:  NITROSTAT Place 1 tablet (0.4 mg total) under the tongue every 5 (five) minutes as needed for chest pain.   olmesartan-hydrochlorothiazide 20-12.5 MG tablet Commonly known as:  BENICAR HCT Take 1 tablet by mouth daily.   ONETOUCH DELICA LANCETS 19J Misc CHECK BLOOD SUGAR 2 TIMES A Black     pantoprazole 40 MG tablet Commonly known as:  PROTONIX Take 1 tablet (40 mg total) by mouth daily.   prasugrel 10 MG Tabs tablet Commonly known as:  EFFIENT Take 1 tablet (10 mg total) by mouth daily.   ranolazine 500 MG 12 hr tablet Commonly known as:  RANEXA Take 1 tablet (500 mg total) by mouth 2 (two) times daily.        Acute coronary syndrome (MI, NSTEMI, STEMI, etc)  this admission?: No.    Outstanding Labs/Studies   Lipid panel and LFTs in 6-8 weeks if able to tolerate statins  Duration of Discharge Encounter   Greater than 30 minutes including physician time.  Signed, Leanor Kail, PA 07/23/2018, 8:21 AM  Agree with note by Robbie Lis PA-C  Postop Black #1 complex PCI of an occluded circumflex and high-grade distal dominant RCA stenosis with normal LV function in the setting of accelerated angina.  He had 2 drug-eluting stents placed.  He had an excellent angiographic result.  He is on aspirin and Effient as well as statin drug.  Exam is benign.  Had no recurrent chest pain.  Plan for discharge home today.  He already has an appointment to see Dr. Agustin Cree in the [place] office in follow-up.  Lorretta Harp, M.D., [place], [facility], [place] [address]. [place], [place]  [address]  [phone] 07/23/2018 9:49 AM

## 2018-07-23 NOTE — Progress Notes (Addendum)
CARDIAC REHAB PHASE I   PRE:  Rate/Rhythm: 72 SR  BP:  Sitting: 114/78      SaO2: 97 RA  MODE:  Ambulation: 800 ft   POST:  Rate/Rhythm: 86 SR  BP:  Sitting: 118/81    SaO2: 98 RA   Pt ambulated 851ft in hallway independently with fast steady gait. Pt encouraged to slow down and take it easy. Pt denies CP, does c/o some SOB. Pt and wife educated on importance of Effient, ASA, statin, and NTG. Stent card at bedside. Pt given heart healthy diet. Reviewed restrictions and exercise guidelines. Will refer to CRP II [place].   [id number] Rufina Falco, RN BSN 07/23/2018 8:58 AM

## 2018-07-23 NOTE — Care Management Note (Addendum)
Case Management Note  Patient Details  Name: David Black MRN: [id number] Date of Birth: 1960/08/23  Subjective/Objective: from home with wife, s/p stent intervention, will be on effient, NCM informed patient and wife of co pay amt and preferred pharmacy.  Wife states she is just concerned about the stent being taking care of because [facility] dropped the ball for previous precert and that is why he is here now.  NCM informed her that she should contact MD office regarding this, she states they told her to contact the NCM.  NCM informed her that I will try to find out who to give this information to and have them call her and NCM will call her also to let her know what they said.  THis NCM called Dr. Darcus Pester office and was instructed by voice mail to call [phone].  This NCM called this phone number and spoke with David Black, informed her of what the wife had told me that [employer]ella Scheuermann will not pay if the stent has not been precerted or retro precerted.  David Black states she will see what she can do to get this message out so they can start on it. NCM gave David Black the wife phone to have them contact her regarding this information.   12/5 David Bamberger RN, BSN- NCM spoke with David Black in [place] she states she will call patient wife, David Black and let her know about the authorization for stent procedure, she has an auth now but not sure if it is for cath or the stent.                    Action/Plan: DC home when ready.   Expected Discharge Date:  07/23/18               Expected Discharge Plan:  Home/Self Care  In-House Referral:     Discharge planning Services  CM Consult  Post Acute Care Choice:    Choice offered to:     DME Arranged:    DME Agency:     HH Arranged:    HH Agency:     Status of Service:  Completed, signed off  If discussed at [employer] of Stay Meetings, dates discussed:    Additional Comments:  David Mayo, RN 07/23/2018, 1:08 PM

## 2018-07-23 NOTE — Telephone Encounter (Signed)
Patient's wife, Olivia Mackie called asking to speak with practice manager Heywood Iles, with concerns of patient's recent cardiac catheterization and stent placement. During catheterization blockages were found and stents were placed. The wife is concerned about the stent placement and if this would be covered under the current approved pre authorization. Heywood Iles practice manager informed the patient's wife that she will discuss this with our senior billing manager about adding this to current pre authorization and/or new pre authorization for the stent placement. The wife also included " "They" told me he had a heart attack, and wants to know if this could have been prevented" Heywood Iles then asked "Who told you this and when did they say he had a heart attack?", patient's wife didn't answer who told her this but states " "They"told me they couldn't determine when the heart attack was". Her main concern is the authorization for the stents. Practice manager asked the wife if there were any other concerns at this time and she said no. Heywood Iles will follow up with patient's wife after discussing her concerns with billing manager. Dr. Wendy Poet team will inform Dr. Agustin Cree of concerns upon his return on 07/29/18, and patient will be seen on 08/08/18 with Dr. Agustin Cree.

## 2018-08-01 ENCOUNTER — Encounter (HOSPITAL_COMMUNITY): Payer: Self-pay

## 2018-08-01 ENCOUNTER — Telehealth (HOSPITAL_COMMUNITY): Payer: Self-pay

## 2018-08-01 NOTE — Telephone Encounter (Signed)
Attempted to call patient in regards to Cardiac Rehab - unable to leave voicemail.  Mailed letter

## 2018-08-01 NOTE — Telephone Encounter (Signed)
Pt insurance is active and benefits verified through [place]. Co-pay $40.00, DED $0.00/$0.00 met, out of pocket $6,000.00/$650.60 met, co-insurance 0%. No pre-authorization required. [employer], 08/01/18 @ 249PM, REF# [id number]

## 2018-08-08 ENCOUNTER — Encounter: Payer: Self-pay | Admitting: Cardiology

## 2018-08-08 ENCOUNTER — Ambulatory Visit: Payer: Managed Care, Other (non HMO) | Admitting: Cardiology

## 2018-08-08 VITALS — BP 110/78 | HR 64 | Resp 14 | Ht 69.0 in | Wt 243.0 lb

## 2018-08-08 DIAGNOSIS — I209 Angina pectoris, unspecified: Secondary | ICD-10-CM | POA: Diagnosis not present

## 2018-08-08 DIAGNOSIS — I2510 Atherosclerotic heart disease of native coronary artery without angina pectoris: Secondary | ICD-10-CM | POA: Diagnosis not present

## 2018-08-08 DIAGNOSIS — I10 Essential (primary) hypertension: Secondary | ICD-10-CM | POA: Diagnosis not present

## 2018-08-08 DIAGNOSIS — E088 Diabetes mellitus due to underlying condition with unspecified complications: Secondary | ICD-10-CM | POA: Diagnosis not present

## 2018-08-08 DIAGNOSIS — E782 Mixed hyperlipidemia: Secondary | ICD-10-CM

## 2018-08-08 MED ORDER — ATORVASTATIN CALCIUM 20 MG PO TABS: 20.0000 mg | ORAL_TABLET | Freq: Every day | ORAL | 1 refills | Status: DC

## 2018-08-08 NOTE — Progress Notes (Signed)
Cardiology Office Note:    Date:  08/08/2018   ID:  David Black, DOB 04/06/1961, MRN [id number]  PCP:  Janora Norlander, DO  Cardiologist:  Jenne Campus, MD    Referring MD: Janora Norlander, DO   Chief Complaint  Patient presents with  . Follow-up  Doing well  History of Present Illness:    David Black is a 57 y.o. male who came to me with typical angina pectoris he was sent for cardiac catheterization lesion in the circumflex artery as well as right coronary artery has been fixed since that time he is doing better denies have any chest pain tightness squeezing pressure burning chest he started losing weight to start working out doing well with this no symptoms we spent a great deal of time talking about what was done we talked about medications in need of taking those medications.  He understood the reason for medication we will continue I will double the dose of Lipitor he takes 10 mg we will go to 20 previously had some difficulty tolerating higher dosages of statin therefore I will go very slowly and carefully.  My hope will be to be able to put him on high intensity statin we talked about diet and exercises on the regular basis which he already doing.  His diabetes happy  Past Medical History:  Diagnosis Date  . CAD (coronary artery disease)    Minimal plaque on catheterization Nov 2011; EF normal by history  . Chest discomfort 07/18/2010   With exertion  . Depression with anxiety   . Diabetes mellitus without complication ([place])   . GERD (gastroesophageal reflux disease)   . Heart murmur   . Hyperlipidemia   . Hypertension    x 10 years  . Nocturnal hypoxia 09/14/2013  . Obesity, unspecified   . Smoker    x 20 years, 1 1/2 ppd, quit 7 years ago  . SOB (shortness of breath) 07/18/2010    Past Surgical History:  Procedure Laterality Date  . CHOLECYSTECTOMY    . CORONARY STENT INTERVENTION N/A 07/22/2018   Procedure: CORONARY STENT INTERVENTION;  Surgeon:  Leonie Man, MD;  Location: [facility];  Service: Cardiovascular;  Laterality: N/A;  . FINGER SURGERY     right hand,ring finger  . HERNIA REPAIR    . LEFT HEART CATH AND CORONARY ANGIOGRAPHY N/A 07/22/2018   Procedure: LEFT HEART CATH AND CORONARY ANGIOGRAPHY;  Surgeon: Leonie Man, MD;  Location: [place] [facility];  Service: Cardiovascular;  Laterality: N/A;  . VENTRAL HERNIA REPAIR  06/2010   x3    Current Medications: Current Meds  Medication Sig  . aspirin 81 MG tablet Take 81 mg by mouth daily.    Marland Kitchen atorvastatin (LIPITOR) 10 MG tablet Take 1 tablet (10 mg total) by mouth daily.  Marland Kitchen BAYER CONTOUR NEXT TEST test strip Use as instructed  . cyclobenzaprine (FLEXERIL) 10 MG tablet Take 1 tablet (10 mg total) by mouth 3 (three) times daily as needed for muscle spasms. (Patient taking differently: Take 10 mg by mouth as needed for muscle spasms. )  . empagliflozin (JARDIANCE) 25 MG TABS tablet Take 25 mg by mouth daily.  . fenofibrate 54 MG tablet Take 1 tablet (54 mg total) by mouth daily.  Marland Kitchen gabapentin (NEURONTIN) 300 MG capsule TAKE (1) CAPSULE THREE TIMES DAILY. (Patient taking differently: Take 300 mg by mouth 3 (three) times daily. TAKE (1) CAPSULE THREE TIMES DAILY.)  . meloxicam (MOBIC)  15 MG tablet Take 7.5 mg by mouth daily.  . metoprolol tartrate (LOPRESSOR) 25 MG tablet Take 0.5 tablets (12.5 mg total) by mouth 2 (two) times daily.  . nitroGLYCERIN (NITROSTAT) 0.4 MG SL tablet Place 1 tablet (0.4 mg total) under the tongue every 5 (five) minutes as needed for chest pain.  Marland Kitchen olmesartan-hydrochlorothiazide (BENICAR HCT) 20-12.5 MG tablet Take 1 tablet by mouth daily.  Glory Rosebush DELICA LANCETS 50N MISC CHECK BLOOD SUGAR 2 TIMES A DAY  . pantoprazole (PROTONIX) 40 MG tablet Take 1 tablet (40 mg total) by mouth daily.  . prasugrel (EFFIENT) 10 MG TABS tablet Take 1 tablet (10 mg total) by mouth daily.  . ranolazine (RANEXA) 500 MG 12 hr tablet Take 1 tablet (500 mg  total) by mouth 2 (two) times daily.     Allergies:   Advicor [niacin-lovastatin er]; Altace [ramipril]; Bextra [valdecoxib]; Bupropion hcl; Janumet [sitagliptin-metformin hcl]; Niacin; Rofecoxib; Sulfamethoxazole-trimethoprim; and Sulfonamide derivatives   Social History   Socioeconomic History  . Marital status: Married    Spouse name: Not on file  . Number of children: Not on file  . Years of education: Not on file  . Highest education level: Not on file  Occupational History  . Not on file  Social Needs  . Financial resource strain: Not on file  . Food insecurity:    Worry: Not on file    Inability: Not on file  . Transportation needs:    Medical: Not on file    Non-medical: Not on file  Tobacco Use  . Smoking status: Former Smoker    Packs/day: 1.50    Years: 20.00    Pack years: 30.00    Types: Cigarettes    Last attempt to quit: 08/20/2002    Years since quitting: 15.9  . Smokeless tobacco: Never Used  Substance and Sexual Activity  . Alcohol use: No    Alcohol/week: 0.0 standard drinks  . Drug use: No  . Sexual activity: Not on file  Lifestyle  . Physical activity:    Days per week: Not on file    Minutes per session: Not on file  . Stress: Not on file  Relationships  . Social connections:    Talks on phone: Not on file    Gets together: Not on file    Attends religious service: Not on file    Active member of club or organization: Not on file    Attends meetings of clubs or organizations: Not on file    Relationship status: Not on file  Other Topics Concern  . Not on file  Social History Narrative   Married with 1 daughter     Family History: The patient's family history includes Breast cancer in his paternal aunt; Coronary artery disease in an other family member; Diabetes in his mother; Heart disease in his father and paternal uncle; Hyperlipidemia in his father and mother; Hypertension in his father and mother; Kidney disease in his father;  Testicular cancer in his paternal uncle. There is no history of Colon cancer, Esophageal cancer, Stomach cancer, or Rectal cancer. ROS:   Please see the history of present illness.    All 14 point review of systems negative except as described per history of present illness  EKGs/Labs/Other Studies Reviewed:      Recent Labs: 10/01/2017: TSH 1.320 01/15/2018: ALT 44 07/23/2018: BUN 16; Creatinine, Ser 1.34; Hemoglobin 15.6; Platelets 227; Potassium 3.9; Sodium 140  Recent Lipid Panel    Component Value Date/Time  CHOL 165 10/01/2017 1457   TRIG 161 (H) 10/01/2017 1457   TRIG 472 (H) 09/14/2013 0854   HDL 33 (L) 10/01/2017 1457   HDL 35 (L) 09/14/2013 0854   CHOLHDL 5.0 10/01/2017 1457   LDLCALC 100 (H) 10/01/2017 1457   LDLCALC NOTES: 09/14/2013 0854    Physical Exam:    VS:  BP 110/78   Pulse 64   Resp 14   Ht 5\' 9"  (1.753 m)   Wt 243 lb (110.2 kg)   BMI 35.88 kg/m     Wt Readings from Last 3 Encounters:  08/08/18 243 lb (110.2 kg)  07/23/18 253 lb 4.9 oz (114.9 kg)  07/14/18 258 lb 12.8 oz (117.4 kg)     GEN:  Well nourished, well developed in no acute distress HEENT: Normal NECK: No JVD; No carotid bruits LYMPHATICS: No lymphadenopathy CARDIAC: RRR, no murmurs, no rubs, no gallops RESPIRATORY:  Clear to auscultation without rales, wheezing or rhonchi  ABDOMEN: Soft, non-tender, non-distended MUSCULOSKELETAL:  No edema; No deformity  SKIN: Warm and dry LOWER EXTREMITIES: no swelling NEUROLOGIC:  Alert and oriented x 3 PSYCHIATRIC:  Normal affect   ASSESSMENT:    1. Angina pectoris ([place])   2. Coronary artery disease involving native coronary artery of native heart without angina pectoris   3. Essential hypertension   4. Diabetes mellitus due to underlying condition with unspecified complications ([place])   5. Mixed hyperlipidemia    PLAN:    In order of problems listed above:  1. .  Angina pectoris: Denies having any 2. Coronary artery disease status  post intervention procedure explained medication explained doing well 3. Essential hypertension we will continue present management. 4. Diabetes mellitus stable 5. Mixed dyslipidemia plan as outlined above we will increase dose of statin   Medication Adjustments/Labs and Tests Ordered: Current medicines are reviewed at length with the patient today.  Concerns regarding medicines are outlined above.  No orders of the defined types were placed in this encounter.  Medication changes: No orders of the defined types were placed in this encounter.   Signed, Park Liter, MD, [facility] 08/08/2018 5:02 PM    [place]

## 2018-08-08 NOTE — Patient Instructions (Signed)
Medication Instructions:  Your physician has recommended you make the following change in your medication:   Increase: atorvastatin to 20 mg daily   If you need a refill on your cardiac medications before your next appointment, please call your pharmacy.   Lab work: Your physician recommends that you return for lab work lipids fasting in 6 weeks no appointment needed.   If you have labs (blood work) drawn today and your tests are completely normal, you will receive your results only by: Marland Kitchen MyChart Message (if you have MyChart) OR . A paper copy in the mail If you have any lab test that is abnormal or we need to change your treatment, we will call you to review the results.  Testing/Procedures: None.   Follow-Up: At [facility], you and your health needs are our priority.  As part of our continuing mission to provide you with exceptional heart care, we have created designated Provider Care Teams.  These Care Teams include your primary Cardiologist (physician) and Advanced Practice Providers (APPs -  Physician Assistants and Nurse Practitioners) who all work together to provide you with the care you need, when you need it. You will need a follow up appointment in 2 months.  Please call our office 2 months in advance to schedule this appointment.  You may see Jenne Campus, MD or another member of our [place] Provider Team in [place]: Shirlee More, MD . Jyl Heinz, MD  Any Other Special Instructions Will Be Listed Below (If Applicable).

## 2018-08-11 ENCOUNTER — Ambulatory Visit: Payer: Managed Care, Other (non HMO) | Admitting: Cardiology

## 2018-08-19 ENCOUNTER — Encounter (HOSPITAL_COMMUNITY): Payer: Self-pay

## 2018-08-19 NOTE — Telephone Encounter (Signed)
Attempted to call pt a 2nd time- LM ON VM ° °Mailed letter out °

## 2018-08-22 NOTE — Telephone Encounter (Signed)
Please touch base with Dr Raliegh Ip about this on Monday.    Thank you!

## 2018-08-28 ENCOUNTER — Encounter: Payer: Self-pay | Admitting: Family Medicine

## 2018-08-28 ENCOUNTER — Ambulatory Visit (INDEPENDENT_AMBULATORY_CARE_PROVIDER_SITE_OTHER): Payer: Managed Care, Other (non HMO) | Admitting: Family Medicine

## 2018-08-28 VITALS — BP 112/73 | HR 65 | Temp 98.4°F | Ht 69.0 in | Wt 240.6 lb

## 2018-08-28 DIAGNOSIS — Z566 Other physical and mental strain related to work: Secondary | ICD-10-CM

## 2018-08-28 DIAGNOSIS — R41840 Attention and concentration deficit: Secondary | ICD-10-CM | POA: Diagnosis not present

## 2018-08-28 DIAGNOSIS — G479 Sleep disorder, unspecified: Secondary | ICD-10-CM | POA: Diagnosis not present

## 2018-08-28 DIAGNOSIS — F419 Anxiety disorder, unspecified: Secondary | ICD-10-CM

## 2018-08-28 MED ORDER — CITALOPRAM HYDROBROMIDE 10 MG PO TABS: 10.0000 mg | ORAL_TABLET | Freq: Every day | ORAL | 1 refills | Status: DC

## 2018-08-28 NOTE — Progress Notes (Signed)
Subjective: SW:[id number] PCP: Janora Norlander, DO [id number]:[id number] M Anctil is a 58 y.o. male presenting to clinic today for:  1. Stress Patient reports that he has had several month history of stress, anxiety and some depressive symptoms which she describes as poor concentration.  He notes that there was a recent change in management at his current job and that they are putting much more pressure on him.  He states that he is often called on his days off and in the middle of the night which results in poor sleep and no time to diffuse.  Typically he would play golf or spend time with family to improve mood and relieve stress but he is unable to do this secondary to work pressures.  He has been told multiple times by his employer that this is part of his job description because he is on Salary.  He notes that he had a heart attack this past fall which stress likely contributed to as well.  He has never been treated for anxiety or depression in the past.  No history of mental health disorder or hospitalizations.  No known family history of mental health disorder.  He is a non-smoker.  No alcohol or drug use.  No SI, HI, visual or auditory hallucinations.   ROS: Per HPI  Allergies  Allergen Reactions  . Advicor [Niacin-Lovastatin Er] Other (See Comments)    unknown  . Altace [Ramipril] Other (See Comments)    cough  . Atorvastatin Other (See Comments)    Muscle aches   . Bextra [Valdecoxib] Swelling  . Bupropion Hcl Other (See Comments)    Unknown  . Janumet [Sitagliptin-Metformin Hcl] Other (See Comments)    unknown  . Niacin Other (See Comments)    unknown  . Rofecoxib Swelling  . Sulfamethoxazole-Trimethoprim Other (See Comments)    unknown  . Sulfonamide Derivatives Other (See Comments)    unknown   Past Medical History:  Diagnosis Date  . CAD (coronary artery disease)    Minimal plaque on catheterization Nov 2011; EF normal by history  . Chest discomfort 07/18/2010   With  exertion  . Depression with anxiety   . Diabetes mellitus without complication ([place])   . GERD (gastroesophageal reflux disease)   . Heart murmur   . Hyperlipidemia   . Hypertension    x 10 years  . Nocturnal hypoxia 09/14/2013  . Obesity, unspecified   . Smoker    x 20 years, 1 1/2 ppd, quit 7 years ago  . SOB (shortness of breath) 07/18/2010    Current Outpatient Medications:  .  aspirin 81 MG tablet, Take 81 mg by mouth daily.  , Disp: , Rfl:  .  BAYER CONTOUR NEXT TEST test strip, Use as instructed, Disp: 100 each, Rfl: 2 .  cyclobenzaprine (FLEXERIL) 10 MG tablet, Take 1 tablet (10 mg total) by mouth 3 (three) times daily as needed for muscle spasms. (Patient taking differently: Take 10 mg by mouth as needed for muscle spasms. ), Disp: 30 tablet, Rfl: 3 .  empagliflozin (JARDIANCE) 25 MG TABS tablet, Take 25 mg by mouth daily., Disp: 30 tablet, Rfl: 5 .  fenofibrate 54 MG tablet, Take 1 tablet (54 mg total) by mouth daily., Disp: 90 tablet, Rfl: 3 .  gabapentin (NEURONTIN) 300 MG capsule, TAKE (1) CAPSULE THREE TIMES DAILY. (Patient taking differently: Take 300 mg by mouth 3 (three) times daily. TAKE (1) CAPSULE THREE TIMES DAILY.), Disp: 270 capsule, Rfl: 3 .  meloxicam (MOBIC)  15 MG tablet, Take 7.5 mg by mouth daily., Disp: , Rfl:  .  metoprolol tartrate (LOPRESSOR) 25 MG tablet, Take 0.5 tablets (12.5 mg total) by mouth 2 (two) times daily., Disp: 30 tablet, Rfl: 6 .  olmesartan-hydrochlorothiazide (BENICAR HCT) 20-12.5 MG tablet, Take 1 tablet by mouth daily. (Patient taking differently: Take 0.5 tablets by mouth daily. ), Disp: 90 tablet, Rfl: 3 .  ONETOUCH DELICA LANCETS 16X MISC, CHECK BLOOD SUGAR 2 TIMES A DAY, Disp: 100 each, Rfl: 1 .  pantoprazole (PROTONIX) 40 MG tablet, Take 1 tablet (40 mg total) by mouth daily., Disp: 90 tablet, Rfl: 3 .  prasugrel (EFFIENT) 10 MG TABS tablet, Take 1 tablet (10 mg total) by mouth daily., Disp: 30 tablet, Rfl: 11 .  nitroGLYCERIN  (NITROSTAT) 0.4 MG SL tablet, Place 1 tablet (0.4 mg total) under the tongue every 5 (five) minutes as needed for chest pain. (Patient not taking: Reported on 08/28/2018), Disp: 30 tablet, Rfl: 2 Social History   Socioeconomic History  . Marital status: Married    Spouse name: Not on file  . Number of children: Not on file  . Years of education: Not on file  . Highest education level: Not on file  Occupational History  . Not on file  Social Needs  . Financial resource strain: Not on file  . Food insecurity:    Worry: Not on file    Inability: Not on file  . Transportation needs:    Medical: Not on file    Non-medical: Not on file  Tobacco Use  . Smoking status: Former Smoker    Packs/day: 1.50    Years: 20.00    Pack years: 30.00    Types: Cigarettes    Last attempt to quit: 08/20/2002    Years since quitting: 16.0  . Smokeless tobacco: Never Used  Substance and Sexual Activity  . Alcohol use: No    Alcohol/week: 0.0 standard drinks  . Drug use: No  . Sexual activity: Not on file  Lifestyle  . Physical activity:    Days per week: Not on file    Minutes per session: Not on file  . Stress: Not on file  Relationships  . Social connections:    Talks on phone: Not on file    Gets together: Not on file    Attends religious service: Not on file    Active member of club or organization: Not on file    Attends meetings of clubs or organizations: Not on file    Relationship status: Not on file  . Intimate partner violence:    Fear of current or ex partner: Not on file    Emotionally abused: Not on file    Physically abused: Not on file    Forced sexual activity: Not on file  Other Topics Concern  . Not on file  Social History Narrative   Married with 1 daughter   Family History  Problem Relation Age of Onset  . Heart disease Father   . Hyperlipidemia Father   . Hypertension Father   . Kidney disease Father   . Testicular cancer Paternal Uncle   . Heart disease  Paternal Uncle   . Diabetes Mother   . Hyperlipidemia Mother   . Hypertension Mother   . Breast cancer Paternal Aunt   . Coronary artery disease Other   . Colon cancer Neg Hx   . Esophageal cancer Neg Hx   . Stomach cancer Neg Hx   . Rectal  cancer Neg Hx     Objective: Office vital signs reviewed. BP 112/73   Pulse 65   Temp 98.4 F (36.9 C) (Oral)   Ht 5\' 9"  (1.753 m)   Wt 240 lb 9.6 oz (109.1 kg)   BMI 35.53 kg/m   Physical Examination:  General: Awake, alert, well nourished, No acute distress HEENT: Normal, sclera white, MMM Cardio: regular rate and rhythm, S1S2 heard, no murmurs appreciated Pulm: clear to auscultation bilaterally, no wheezes, rhonchi or rales; normal work of breathing on room air Neuro: no tremor Psych: Mood stable, speech normal, affect appropriate, pleasant and interactive.  He does not appear to be responding to internal stimuli.  Eye contact is fair. Depression screen [facility] 2/9 08/28/2018 06/03/2018 05/30/2018  Decreased Interest 0 0 0  Down, Depressed, Hopeless 0 0 0  PHQ - 2 Score 0 0 0  Altered sleeping 3 - -  Tired, decreased energy 1 - -  Change in appetite 1 - -  Feeling bad or failure about yourself  0 - -  Trouble concentrating 0 - -  Moving slowly or fidgety/restless 0 - -  Suicidal thoughts 0 - -  PHQ-9 Score 5 - -   GAD 7 : Generalized Anxiety Score 08/28/2018  Nervous, Anxious, on Edge 2  Control/stop worrying 2  Worry too much - different things 2  Trouble relaxing 1  Restless 1  Easily annoyed or irritable 1  Afraid - awful might happen 0  Total GAD 7 Score 9    Assessment/ Plan: 58 y.o. male   1. Stress at work I certainly think that his anxiety symptoms and mood disturbance is related to work.  However, it does not appear from our conversation that this is a situation is likely to change at any time.  We did discuss establishing clear boundaries, particularly when it comes to sleep and nighttime calls.  We discussed the  importance of good sleep for mood and overall health.  I have agreed to start an SSRI for this patient.  I reviewed his last EKG and his QTc interval was within normal limits.  We did discuss the possible interaction with meloxicam for GI bleed and discussed the signs and symptoms of this.  He voiced good understanding and wished to proceed with medication.  Additionally, he has counseling services at work but has not pursued this yet.  I did encourage him to consider this.  He will follow-up in 6 weeks or sooner if needed.  2. Sleep disturbance  3. Anxiety  4. Poor concentration As above.   No orders of the defined types were placed in this encounter.  Meds ordered this encounter  Medications  . citalopram (CELEXA) 10 MG tablet    Sig: Take 1 tablet (10 mg total) by mouth daily.    Dispense:  30 tablet    Refill:  1   Total time spent with patient 25 minutes.  Greater than 50% of encounter spent in coordination of care/counseling.    Janora Norlander, DO [place] [phone]

## 2018-08-28 NOTE — Patient Instructions (Signed)
We are starting Celexa 10 mg daily.  Again, this may take about a week before you start noticing any significant improvement in symptoms.  We discussed coping mechanisms and consideration for counseling as well.  We also discussed setting clear boundaries set up allow yourself to have time to do stress at home and sleep.  Sleep is essential and management of stress and mood.  We also discussed the possible interaction of Celexa and meloxicam, which can increase risk for GI bleed.  We discussed the signs and symptoms of this.  See me in 6 weeks for recheck  Taking the medicine as directed and not missing any doses is one of the best things you can do to treat your symptoms.  Here are some things to keep in mind:  1) Side effects (stomach upset, some increased anxiety) may happen before you notice a benefit.  These side effects typically go away over time. 2) Changes to your dose of medicine or a change in medication all together is sometimes necessary 3) Most people need to be on medication at least 12 months 4) Many people will notice an improvement within two weeks but the full effect of the medication can take up to 4-6 weeks 5) Stopping the medication when you start feeling better often results in a return of symptoms 6) Never discontinue your medication without contacting a health care professional first.  Some medications require gradual discontinuation/ taper and can make you sick if you stop them abruptly.  If your symptoms worsen or you have thoughts of suicide/homicide, PLEASE SEEK IMMEDIATE MEDICAL ATTENTION.  You may always call:  National Suicide Hotline: (479)677-2738 [place]: (863)352-7107 Crisis Recovery in [place]: [phone]   These are available 24 hours a day, 7 days a week.

## 2018-09-02 ENCOUNTER — Ambulatory Visit: Payer: Managed Care, Other (non HMO) | Admitting: Family Medicine

## 2018-09-02 ENCOUNTER — Telehealth: Payer: Self-pay | Admitting: Emergency Medicine

## 2018-09-02 MED ORDER — EZETIMIBE 10 MG PO TABS: 10.0000 mg | ORAL_TABLET | Freq: Every day | ORAL | 1 refills | Status: DC

## 2018-09-02 NOTE — Telephone Encounter (Signed)
Left message for patient to return call regarding mychart message.

## 2018-09-02 NOTE — Addendum Note (Signed)
Addended by: Linna Hoff R on: 09/02/2018 04:00 PM   Modules accepted: Orders

## 2018-09-02 NOTE — Telephone Encounter (Signed)
Spoke to patient's wife. She reports they stopped his lipitor 2 weeks ago due to muscle aches and They cut his benicar in half to prevent dizziness and low blood pressure. His wife wants more recommendation on the injectable cholesterol medication and insight on what they should do now. Left message for patient's wife to return call. Will inform her that Dr. Agustin Cree has advised to start zetia 10 mg daily and recheck labs at his next appointment.    Wife called back informed to start zetia 10 mg daily and we will recheck labs at next  appointment. She verbally understands. Advised her to call with any other questions

## 2018-09-08 ENCOUNTER — Telehealth (HOSPITAL_COMMUNITY): Payer: Self-pay

## 2018-09-08 NOTE — Telephone Encounter (Signed)
3rd attempt to call patient in regards to Cardiac Rehab - lm on vm

## 2018-09-09 ENCOUNTER — Ambulatory Visit (INDEPENDENT_AMBULATORY_CARE_PROVIDER_SITE_OTHER): Payer: Managed Care, Other (non HMO) | Admitting: Family Medicine

## 2018-09-09 ENCOUNTER — Encounter: Payer: Self-pay | Admitting: Family Medicine

## 2018-09-09 VITALS — BP 109/75 | HR 66 | Temp 97.3°F | Ht 69.0 in | Wt 236.0 lb

## 2018-09-09 DIAGNOSIS — Z789 Other specified health status: Secondary | ICD-10-CM

## 2018-09-09 DIAGNOSIS — Z1211 Encounter for screening for malignant neoplasm of colon: Secondary | ICD-10-CM

## 2018-09-09 DIAGNOSIS — R6889 Other general symptoms and signs: Secondary | ICD-10-CM

## 2018-09-09 LAB — VERITOR FLU A/B WAIVED
Influenza A: NEGATIVE
Influenza B: NEGATIVE

## 2018-09-09 NOTE — Patient Instructions (Signed)
Your flu test was negative.    Your symptoms may be related to the medication but may just be a result of a viral illness.  Stay off the Zetia for a few days until symptoms resolve and you can try again after.  If symptoms return, I recommend discontinuing the medication and talking to your cardiologist.  We discussed Livalo which may be an option versus proceeding with Repatha, which is the injectable.  I did not have any samples of the Livalo but I have given you a coupon card.  Talk to your cardiologist about this as a potential alternative.  It is a statin but tends to have less side effects.

## 2018-09-09 NOTE — Progress Notes (Signed)
Subjective: CC: myalgia PCP: Janora Norlander, DO QPR:[id number] M David Black is a 58 y.o. male presenting to clinic today for:  1. Flu like symptoms Patient reports onset of flulike symptoms Sunday evening.  They started with chills and he had an episode of diarrhea yesterday.  He has had headaches, body aches and decreased appetite.  Denies any associated fevers or other URI symptoms.  No known sick contacts.  He is feeling slightly better today and attributes this to having skipped his Zetia, which was started last week.  He wonders if the symptoms are related to the Zetia.  He has had history of statin intolerance in the past and therefore was placed on this medication as an alternative.  Medical history significant for coronary artery disease.  2.  Need for colonoscopy Patient has been scheduled for colonoscopy but had an MI and therefore this was canceled.  He wishes to reschedule this now that he has been stable on medications.  There will be plans for holding the Effient prior to the procedure but he has been in close contact with his cardiologist about this who thinks that he would be able to come off of this for the procedure.  He was previously scheduled [facility] and would like to get another appointment with them.   ROS: Per HPI  Allergies  Allergen Reactions  . Advicor [Niacin-Lovastatin Er] Other (See Comments)    unknown  . Altace [Ramipril] Other (See Comments)    cough  . Atorvastatin Other (See Comments)    Muscle aches   . Bextra [Valdecoxib] Swelling  . Bupropion Hcl Other (See Comments)    Unknown  . Janumet [Sitagliptin-Metformin Hcl] Other (See Comments)    unknown  . Niacin Other (See Comments)    unknown  . Rofecoxib Swelling  . Sulfamethoxazole-Trimethoprim Other (See Comments)    unknown  . Sulfonamide Derivatives Other (See Comments)    unknown   Past Medical History:  Diagnosis Date  . CAD (coronary artery disease)    Minimal plaque on catheterization  Nov 2011; EF normal by history  . Chest discomfort 07/18/2010   With exertion  . Depression with anxiety   . Diabetes mellitus without complication ([place])   . GERD (gastroesophageal reflux disease)   . Heart murmur   . Hyperlipidemia   . Hypertension    x 10 years  . Nocturnal hypoxia 09/14/2013  . Obesity, unspecified   . Smoker    x 20 years, 1 1/2 ppd, quit 7 years ago  . SOB (shortness of breath) 07/18/2010    Current Outpatient Medications:  .  aspirin 81 MG tablet, Take 81 mg by mouth daily.  , Disp: , Rfl:  .  BAYER CONTOUR NEXT TEST test strip, Use as instructed, Disp: 100 each, Rfl: 2 .  citalopram (CELEXA) 10 MG tablet, Take 1 tablet (10 mg total) by mouth daily., Disp: 30 tablet, Rfl: 1 .  cyclobenzaprine (FLEXERIL) 10 MG tablet, Take 1 tablet (10 mg total) by mouth 3 (three) times daily as needed for muscle spasms. (Patient taking differently: Take 10 mg by mouth as needed for muscle spasms. ), Disp: 30 tablet, Rfl: 3 .  empagliflozin (JARDIANCE) 25 MG TABS tablet, Take 25 mg by mouth daily., Disp: 30 tablet, Rfl: 5 .  fenofibrate 54 MG tablet, Take 1 tablet (54 mg total) by mouth daily., Disp: 90 tablet, Rfl: 3 .  gabapentin (NEURONTIN) 300 MG capsule, TAKE (1) CAPSULE THREE TIMES DAILY. (Patient taking differently: Take  300 mg by mouth 3 (three) times daily. TAKE (1) CAPSULE THREE TIMES DAILY.), Disp: 270 capsule, Rfl: 3 .  meloxicam (MOBIC) 15 MG tablet, Take 7.5 mg by mouth daily., Disp: , Rfl:  .  metoprolol tartrate (LOPRESSOR) 25 MG tablet, Take 0.5 tablets (12.5 mg total) by mouth 2 (two) times daily., Disp: 30 tablet, Rfl: 6 .  nitroGLYCERIN (NITROSTAT) 0.4 MG SL tablet, Place 1 tablet (0.4 mg total) under the tongue every 5 (five) minutes as needed for chest pain., Disp: 30 tablet, Rfl: 2 .  olmesartan-hydrochlorothiazide (BENICAR HCT) 20-12.5 MG tablet, Take 1 tablet by mouth daily. (Patient taking differently: Take 0.5 tablets by mouth daily. ), Disp: 90 tablet, Rfl:  3 .  ONETOUCH DELICA LANCETS 20U MISC, CHECK BLOOD SUGAR 2 TIMES A DAY, Disp: 100 each, Rfl: 1 .  pantoprazole (PROTONIX) 40 MG tablet, Take 1 tablet (40 mg total) by mouth daily., Disp: 90 tablet, Rfl: 3 .  prasugrel (EFFIENT) 10 MG TABS tablet, Take 1 tablet (10 mg total) by mouth daily., Disp: 30 tablet, Rfl: 11 .  ezetimibe (ZETIA) 10 MG tablet, Take 1 tablet (10 mg total) by mouth daily. (Patient not taking: Reported on 09/09/2018), Disp: 90 tablet, Rfl: 1 Social History   Socioeconomic History  . Marital status: Married    Spouse name: Not on file  . Number of children: Not on file  . Years of education: Not on file  . Highest education level: Not on file  Occupational History  . Not on file  Social Needs  . Financial resource strain: Not on file  . Food insecurity:    Worry: Not on file    Inability: Not on file  . Transportation needs:    Medical: Not on file    Non-medical: Not on file  Tobacco Use  . Smoking status: Former Smoker    Packs/day: 1.50    Years: 20.00    Pack years: 30.00    Types: Cigarettes    Last attempt to quit: 08/20/2002    Years since quitting: 16.0  . Smokeless tobacco: Never Used  Substance and Sexual Activity  . Alcohol use: No    Alcohol/week: 0.0 standard drinks  . Drug use: No  . Sexual activity: Not on file  Lifestyle  . Physical activity:    Days per week: Not on file    Minutes per session: Not on file  . Stress: Not on file  Relationships  . Social connections:    Talks on phone: Not on file    Gets together: Not on file    Attends religious service: Not on file    Active member of club or organization: Not on file    Attends meetings of clubs or organizations: Not on file    Relationship status: Not on file  . Intimate partner violence:    Fear of current or ex partner: Not on file    Emotionally abused: Not on file    Physically abused: Not on file    Forced sexual activity: Not on file  Other Topics Concern  . Not on  file  Social History Narrative   Married with 1 daughter   Family History  Problem Relation Age of Onset  . Heart disease Father   . Hyperlipidemia Father   . Hypertension Father   . Kidney disease Father   . Testicular cancer Paternal Uncle   . Heart disease Paternal Uncle   . Diabetes Mother   . Hyperlipidemia Mother   .  Hypertension Mother   . Breast cancer Paternal Aunt   . Coronary artery disease Other   . Colon cancer Neg Hx   . Esophageal cancer Neg Hx   . Stomach cancer Neg Hx   . Rectal cancer Neg Hx     Objective: Office vital signs reviewed. BP 109/75   Pulse 66   Temp (!) 97.3 F (36.3 C) (Oral)   Ht 5\' 9"  (1.753 m)   Wt 236 lb (107 kg)   BMI 34.85 kg/m   Physical Examination:  General: Awake, alert, well nourished, No acute distress HEENT: Normal    Neck: No masses palpated. No lymphadenopathy    Ears: Tympanic membranes intact, normal light reflex, no erythema, no bulging    Eyes: PERRLA, extraocular membranes intact, sclera white    Nose: nasal turbinates moist, clear nasal discharge    Throat: moist mucus membranes, no erythema, no tonsillar exudate.  Airway is patent Cardio: regular rate and rhythm, S1S2 heard, no murmurs appreciated Pulm: clear to auscultation bilaterally, no wheezes, rhonchi or rales; normal work of breathing on room air  Assessment/ Plan: 58 y.o. male   1. Flu-like symptoms Patient is afebrile nontoxic-appearing.  His rapid flu was negative.  May be viral URI versus side effects from Zetia.  I have advised him to continue holding off on Zetia until symptoms totally resolve then I would like him to try the medicine again.  If symptoms return then he will likely need to discontinue this medicine totally and pursue other options.  We discussed briefly Livalo versus Repatha.  He will continue to follow-up with cardiology for this matter. - Veritor Flu A/B Waived  2. Screen for colon cancer Referral placed. - Ambulatory referral to  Gastroenterology  3. Statin intolerance As above.   Orders Placed This Encounter  Procedures  . Veritor Flu A/B Waived    Order Specific Question:   Source    Answer:   nasal  . Ambulatory referral to Gastroenterology    Referral Priority:   Routine    Referral Type:   Consultation    Referral Reason:   Specialty Services Required    Number of Visits Requested:   1   No orders of the defined types were placed in this encounter.    Janora Norlander, DO [place] [phone]

## 2018-09-24 ENCOUNTER — Telehealth (HOSPITAL_COMMUNITY): Payer: Self-pay

## 2018-09-24 NOTE — Telephone Encounter (Signed)
No response from pt.  Closed referral  

## 2018-10-09 ENCOUNTER — Ambulatory Visit: Payer: Managed Care, Other (non HMO) | Admitting: Cardiology

## 2018-10-10 ENCOUNTER — Ambulatory Visit: Payer: Managed Care, Other (non HMO) | Admitting: Family Medicine

## 2018-10-13 ENCOUNTER — Ambulatory Visit (INDEPENDENT_AMBULATORY_CARE_PROVIDER_SITE_OTHER): Payer: Managed Care, Other (non HMO) | Admitting: Family Medicine

## 2018-10-13 VITALS — BP 125/87 | HR 75 | Temp 97.5°F | Ht 69.0 in | Wt 236.0 lb

## 2018-10-13 DIAGNOSIS — E118 Type 2 diabetes mellitus with unspecified complications: Secondary | ICD-10-CM | POA: Diagnosis not present

## 2018-10-13 DIAGNOSIS — F341 Dysthymic disorder: Secondary | ICD-10-CM

## 2018-10-13 DIAGNOSIS — E1159 Type 2 diabetes mellitus with other circulatory complications: Secondary | ICD-10-CM

## 2018-10-13 DIAGNOSIS — I152 Hypertension secondary to endocrine disorders: Secondary | ICD-10-CM

## 2018-10-13 DIAGNOSIS — L600 Ingrowing nail: Secondary | ICD-10-CM

## 2018-10-13 DIAGNOSIS — Z1159 Encounter for screening for other viral diseases: Secondary | ICD-10-CM

## 2018-10-13 DIAGNOSIS — I25118 Atherosclerotic heart disease of native coronary artery with other forms of angina pectoris: Secondary | ICD-10-CM

## 2018-10-13 DIAGNOSIS — I10 Essential (primary) hypertension: Secondary | ICD-10-CM

## 2018-10-13 DIAGNOSIS — Z114 Encounter for screening for human immunodeficiency virus [HIV]: Secondary | ICD-10-CM

## 2018-10-13 LAB — BAYER DCA HB A1C WAIVED: HB A1C (BAYER DCA - WAIVED): 5.7 % (ref <7.0)

## 2018-10-13 MED ORDER — CEPHALEXIN 500 MG PO CAPS: 500.0000 mg | ORAL_CAPSULE | Freq: Four times a day (QID) | ORAL | 0 refills | Status: AC

## 2018-10-13 MED ORDER — CITALOPRAM HYDROBROMIDE 20 MG PO TABS: 20.0000 mg | ORAL_TABLET | Freq: Every day | ORAL | 0 refills | Status: DC

## 2018-10-13 NOTE — Patient Instructions (Addendum)
We have increased your Celexa dose to 20mg  today.  Again, given intermittent use of the meloxicam, you should avoid taking these medicines at the same time as they can increase your risk for GI bleeding.  Your sugar looks fantastic today and is at goal.  Your blood pressure is also at goal.  I will contact you the results of your labs once available.  See me in 6-8 weeks for recheck of mood.

## 2018-10-13 NOTE — Progress Notes (Signed)
Subjective: CC: DM PCP: Janora Norlander, DO XKG:YJEHUD David Black is a 58 y.o. male presenting to clinic today for:  1. Type 2 Diabetes w/ HTN and HLD  Patient has been doing well from a type 2 diabetes/hypertensive standpoint.  He brings in a blood pressure log which shows controlled blood pressures typically running 120s over 70s to 80s.  He reports compliance with Benicar 20-12.5 (he takes half a tablet of this), Lopressor 25 mg, Jardiance 25 mg.  Last eye exam: Due.  He has not had a diabetic eye exam in greater than 1 year. Last foot exam: UTD Last A1c: 6.2 05/2018. Nephropathy screen indicated?: on ARB Last flu, zoster and/or pneumovax: Due PNA   ROS: denies dizziness, LOC, polyuria, polydipsia, unintended weight loss/gain, foot ulcerations, numbness or tingling in extremities or chest pain.    2. Stress Patient was last seen in January for this issue.  Stress typically seem to surround work issues and lack of boundaries from his boss.  He was started on Celexa 10 mg daily and he reports that his wife does see an improvement in his mood.  He has not felt a significant change internally but wants to continue the medication and in fact increase the dose.  He continues to have quite a bit of sleep disturbance where he wakes up multiple times per night, anticipating a phone call or actually answering a phone call.  He reports some fatigue, particularly around 7 to 8:00 in the evening where he falls asleep often.  3.  Ingrown toenail Patient reports an ingrown toenail on the right foot.  He also has a spot on the left side that is starting to look irritated.  He worries that he has an infection on the right toe and would like me to look at this today.  He denies any purulence but does report increased tenderness and erythema.  He has an appointment with podiatry in the next couple of days to have toenail looked at and possibly removed.  ROS: Per HPI  Allergies  Allergen Reactions  .  Advicor [Niacin-Lovastatin Er] Other (See Comments)    unknown  . Altace [Ramipril] Other (See Comments)    cough  . Atorvastatin Other (See Comments)    Muscle aches   . Bextra [Valdecoxib] Swelling  . Bupropion Hcl Other (See Comments)    Unknown  . Janumet [Sitagliptin-Metformin Hcl] Other (See Comments)    unknown  . Niacin Other (See Comments)    unknown  . Rofecoxib Swelling  . Sulfamethoxazole-Trimethoprim Other (See Comments)    unknown  . Sulfonamide Derivatives Other (See Comments)    unknown   Past Medical History:  Diagnosis Date  . CAD (coronary artery disease)    Minimal plaque on catheterization Nov 2011; EF normal by history  . Chest discomfort 07/18/2010   With exertion  . Depression with anxiety   . Diabetes mellitus without complication ([place])   . GERD (gastroesophageal reflux disease)   . Heart murmur   . Hyperlipidemia   . Hypertension    x 10 years  . Nocturnal hypoxia 09/14/2013  . Obesity, unspecified   . Smoker    x 20 years, 1 1/2 ppd, quit 7 years ago  . SOB (shortness of breath) 07/18/2010    Current Outpatient Medications:  .  aspirin 81 MG tablet, Take 81 mg by mouth daily.  , Disp: , Rfl:  .  BAYER CONTOUR NEXT TEST test strip, Use as instructed, Disp: 100  each, Rfl: 2 .  citalopram (CELEXA) 10 MG tablet, Take 1 tablet (10 mg total) by mouth daily., Disp: 30 tablet, Rfl: 1 .  cyclobenzaprine (FLEXERIL) 10 MG tablet, Take 1 tablet (10 mg total) by mouth 3 (three) times daily as needed for muscle spasms. (Patient taking differently: Take 10 mg by mouth as needed for muscle spasms. ), Disp: 30 tablet, Rfl: 3 .  empagliflozin (JARDIANCE) 25 MG TABS tablet, Take 25 mg by mouth daily., Disp: 30 tablet, Rfl: 5 .  ezetimibe (ZETIA) 10 MG tablet, Take 1 tablet (10 mg total) by mouth daily. (Patient not taking: Reported on 09/09/2018), Disp: 90 tablet, Rfl: 1 .  fenofibrate 54 MG tablet, Take 1 tablet (54 mg total) by mouth daily., Disp: 90 tablet,  Rfl: 3 .  gabapentin (NEURONTIN) 300 MG capsule, TAKE (1) CAPSULE THREE TIMES DAILY. (Patient taking differently: Take 300 mg by mouth 3 (three) times daily. TAKE (1) CAPSULE THREE TIMES DAILY.), Disp: 270 capsule, Rfl: 3 .  meloxicam (MOBIC) 15 MG tablet, Take 7.5 mg by mouth daily., Disp: , Rfl:  .  metoprolol tartrate (LOPRESSOR) 25 MG tablet, Take 0.5 tablets (12.5 mg total) by mouth 2 (two) times daily., Disp: 30 tablet, Rfl: 6 .  nitroGLYCERIN (NITROSTAT) 0.4 MG SL tablet, Place 1 tablet (0.4 mg total) under the tongue every 5 (five) minutes as needed for chest pain., Disp: 30 tablet, Rfl: 2 .  olmesartan-hydrochlorothiazide (BENICAR HCT) 20-12.5 MG tablet, Take 1 tablet by mouth daily. (Patient taking differently: Take 0.5 tablets by mouth daily. ), Disp: 90 tablet, Rfl: 3 .  ONETOUCH DELICA LANCETS 82N MISC, CHECK BLOOD SUGAR 2 TIMES A DAY, Disp: 100 each, Rfl: 1 .  pantoprazole (PROTONIX) 40 MG tablet, Take 1 tablet (40 mg total) by mouth daily., Disp: 90 tablet, Rfl: 3 .  prasugrel (EFFIENT) 10 MG TABS tablet, Take 1 tablet (10 mg total) by mouth daily., Disp: 30 tablet, Rfl: 11 Social History   Socioeconomic History  . Marital status: Married    Spouse name: Not on file  . Number of children: Not on file  . Years of education: Not on file  . Highest education level: Not on file  Occupational History  . Not on file  Social Needs  . Financial resource strain: Not on file  . Food insecurity:    Worry: Not on file    Inability: Not on file  . Transportation needs:    Medical: Not on file    Non-medical: Not on file  Tobacco Use  . Smoking status: Former Smoker    Packs/day: 1.50    Years: 20.00    Pack years: 30.00    Types: Cigarettes    Last attempt to quit: 08/20/2002    Years since quitting: 16.1  . Smokeless tobacco: Never Used  Substance and Sexual Activity  . Alcohol use: No    Alcohol/week: 0.0 standard drinks  . Drug use: No  . Sexual activity: Not on file    Lifestyle  . Physical activity:    Days per week: Not on file    Minutes per session: Not on file  . Stress: Not on file  Relationships  . Social connections:    Talks on phone: Not on file    Gets together: Not on file    Attends religious service: Not on file    Active member of club or organization: Not on file    Attends meetings of clubs or organizations: Not on file  Relationship status: Not on file  . Intimate partner violence:    Fear of current or ex partner: Not on file    Emotionally abused: Not on file    Physically abused: Not on file    Forced sexual activity: Not on file  Other Topics Concern  . Not on file  Social History Narrative   Married with 1 daughter   Family History  Problem Relation Age of Onset  . Heart disease Father   . Hyperlipidemia Father   . Hypertension Father   . Kidney disease Father   . Testicular cancer Paternal Uncle   . Heart disease Paternal Uncle   . Diabetes Mother   . Hyperlipidemia Mother   . Hypertension Mother   . Breast cancer Paternal Aunt   . Coronary artery disease Other   . Colon cancer Neg Hx   . Esophageal cancer Neg Hx   . Stomach cancer Neg Hx   . Rectal cancer Neg Hx     Objective: Office vital signs reviewed. BP 125/87   Pulse 75   Temp (!) 97.5 F (36.4 C) (Oral)   Ht 5\' 9"  (1.753 David)   Wt 236 lb (107 kg)   BMI 34.85 kg/David   Physical Examination:  General: Awake, alert, obese, No acute distress HEENT: Normal    Eyes: extraocular membranes intact, sclera white Cardio: regular rate and rhythm, S1S2 heard, no murmurs appreciated Pulm: clear to auscultation bilaterally, no wheezes, rhonchi or rales; normal work of breathing on room air Extremities: warm, well perfused, No edema, cyanosis or clubbing; +2 pulses bilaterally Skin: Right lateral aspect of the great toe with induration, erythema and tenderness to palpation.  No expressible purulence noted. Psych: Mood stable, speech normal, affect flat.   Pleasant Depression screen [facility] 2/9 10/13/2018 09/09/2018 08/28/2018  Decreased Interest 0 0 0  Down, Depressed, Hopeless 0 0 0  PHQ - 2 Score 0 0 0  Altered sleeping 0 0 3  Tired, decreased energy 0 0 1  Change in appetite 0 0 1  Feeling bad or failure about yourself  0 0 0  Trouble concentrating 0 0 0  Moving slowly or fidgety/restless 0 0 0  Suicidal thoughts 0 0 0  PHQ-9 Score 0 0 5  Difficult doing work/chores - Not difficult at all -   GAD 7 : Generalized Anxiety Score 10/13/2018 08/28/2018  Nervous, Anxious, on Edge 1 2  Control/stop worrying 0 2  Worry too much - different things 1 2  Trouble relaxing 1 1  Restless 0 1  Easily annoyed or irritable 0 1  Afraid - awful might happen 0 0  Total GAD 7 Score 3 9    Assessment/ Plan: 58 y.o. male   1. DEPRESSION/ANXIETY Anxiety seems to be improving objectively; though subjectively he is not noticing much difference.  Increase Celexa to 20 mg daily.  Plan for follow-up in the next 6 to 8 weeks. - citalopram (CELEXA) 20 MG tablet; Take 1 tablet (20 mg total) by mouth daily.  Dispense: 90 tablet; Refill: 0  2. Type 2 diabetes mellitus with complication, without long-term current use of insulin (HCC) Under excellent control.  Lipid panel obtained today.  Continue current regimen - Lipid Panel - Bayer DCA Hb A1c Waived  3. Hypertension associated with diabetes ([place]) Under great control.  Continue current regimen - Lipid Panel  4. Atherosclerosis of coronary artery of native heart with stable angina pectoris, unspecified vessel or lesion type ([place]) As above -  Lipid Panel  5. Encounter for hepatitis C screening test for low risk patient  - Hepatitis C antibody  6. Screening for HIV without presence of risk factors  - HIV antibody (with reflex)  7. Ingrown toenail of right foot with infection Evidence of infection.  Start Keflex 4 times daily.  Keep follow-up with podiatry.  Will likely benefit from removal of toenail. -  cephALEXin (KEFLEX) 500 MG capsule; Take 1 capsule (500 mg total) by mouth 4 (four) times daily for 5 days.  Dispense: 20 capsule; Refill: 0   Orders Placed This Encounter  Procedures  . Lipid Panel  . Hepatitis C antibody  . HIV antibody (with reflex)  . Bayer DCA Hb A1c Waived   Meds ordered this encounter  Medications  . citalopram (CELEXA) 20 MG tablet    Sig: Take 1 tablet (20 mg total) by mouth daily.    Dispense:  90 tablet    Refill:  0  . cephALEXin (KEFLEX) 500 MG capsule    Sig: Take 1 capsule (500 mg total) by mouth 4 (four) times daily for 5 days.    Dispense:  20 capsule    Refill:  [place], DO [place] [phone]

## 2018-10-14 ENCOUNTER — Other Ambulatory Visit: Payer: Self-pay | Admitting: Family Medicine

## 2018-10-14 LAB — LIPID PANEL
Chol/HDL Ratio: 4.9 ratio (ref 0.0–5.0)
Cholesterol, Total: 161 mg/dL (ref 100–199)
HDL: 33 mg/dL — ABNORMAL LOW (ref >39)
LDL Calculated: 101 mg/dL — ABNORMAL HIGH (ref 0–99)
Triglycerides: 137 mg/dL (ref 0–149)
VLDL Cholesterol Cal: 27 mg/dL (ref 5–40)

## 2018-10-14 LAB — HEPATITIS C ANTIBODY

## 2018-10-14 LAB — HIV ANTIBODY (ROUTINE TESTING W REFLEX): HIV SCREEN 4TH GENERATION: NONREACTIVE

## 2018-10-16 ENCOUNTER — Encounter: Payer: Self-pay | Admitting: *Deleted

## 2018-10-28 ENCOUNTER — Ambulatory Visit (INDEPENDENT_AMBULATORY_CARE_PROVIDER_SITE_OTHER): Payer: Managed Care, Other (non HMO) | Admitting: Cardiology

## 2018-10-28 ENCOUNTER — Encounter: Payer: Self-pay | Admitting: Cardiology

## 2018-10-28 VITALS — BP 130/66 | HR 70 | Ht 69.0 in | Wt 233.8 lb

## 2018-10-28 DIAGNOSIS — Z789 Other specified health status: Secondary | ICD-10-CM | POA: Diagnosis not present

## 2018-10-28 DIAGNOSIS — E1159 Type 2 diabetes mellitus with other circulatory complications: Secondary | ICD-10-CM

## 2018-10-28 DIAGNOSIS — I2510 Atherosclerotic heart disease of native coronary artery without angina pectoris: Secondary | ICD-10-CM | POA: Diagnosis not present

## 2018-10-28 DIAGNOSIS — I10 Essential (primary) hypertension: Secondary | ICD-10-CM | POA: Diagnosis not present

## 2018-10-28 DIAGNOSIS — I152 Hypertension secondary to endocrine disorders: Secondary | ICD-10-CM

## 2018-10-28 NOTE — Patient Instructions (Signed)
Medication Instructions:  Your physician recommends that you continue on your current medications as directed. Please refer to the Current Medication list given to you today.  If you need a refill on your cardiac medications before your next appointment, please call your pharmacy.   Lab work: None.  If you have labs (blood work) drawn today and your tests are completely normal, you will receive your results only by: Marland Kitchen MyChart Message (if you have MyChart) OR . A paper copy in the mail If you have any lab test that is abnormal or we need to change your treatment, we will call you to review the results.  Testing/Procedures: None.   Follow-Up: At [facility], you and your health needs are our priority.  As part of our continuing mission to provide you with exceptional heart care, we have created designated Provider Care Teams.  These Care Teams include your primary Cardiologist (physician) and Advanced Practice Providers (APPs -  Physician Assistants and Nurse Practitioners) who all work together to provide you with the care you need, when you need it. You will need a follow up appointment in 4 months.  Please call our office 2 months in advance to schedule this appointment.  You may see Jenne Campus, MD or another member of our [place] Provider Team in [place]: Shirlee More, MD . Jyl Heinz, MD  Any Other Special Instructions Will Be Listed Below (If Applicable).  Dr. Agustin Cree has referred you to the lipid clinic their office should call you within 1 week. Please call our office if you haven't heard from them.

## 2018-10-28 NOTE — Progress Notes (Signed)
Cardiology Office Note:    Date:  10/28/2018   ID:  David Black, DOB May 29, 1961, MRN [id number]  PCP:  David Norlander, DO  Cardiologist:  Jenne Campus, MD    Referring MD: David Norlander, DO   Chief Complaint  Patient presents with  . 2 month follow up  Doing well  History of Present Illness:    ADRION Black is a 58 y.o. male with coronary artery disease status post PTCA and drug-eluting stent to circumflex artery as well as right coronary artery in December 2019.  Denies have any chest pain tightness squeezing pressure burning chest he is intolerant to Lipitor we try to increase the dose and he developed significant side effects of muscle aches and fatigue he did already try Zetia he did try niacin he try also Crestor before all this medication gave him some side effects we initiated conversation about PCSK9 agent.  He is willing to try we will enroll him to our clinic  Past Medical History:  Diagnosis Date  . CAD (coronary artery disease)    Minimal plaque on catheterization Nov 2011; EF normal by history  . Chest discomfort 07/18/2010   With exertion  . Depression with anxiety   . Diabetes mellitus without complication ([place])   . GERD (gastroesophageal reflux disease)   . Heart murmur   . Hyperlipidemia   . Hypertension    x 10 years  . Nocturnal hypoxia 09/14/2013  . Obesity, unspecified   . Smoker    x 20 years, 1 1/2 ppd, quit 7 years ago  . SOB (shortness of breath) 07/18/2010    Past Surgical History:  Procedure Laterality Date  . CHOLECYSTECTOMY    . CORONARY STENT INTERVENTION N/A 07/22/2018   Procedure: CORONARY STENT INTERVENTION;  Surgeon: Leonie Man, MD;  Location: [facility];  Service: Cardiovascular;  Laterality: N/A;  . FINGER SURGERY     right hand,ring finger  . HERNIA REPAIR    . LEFT HEART CATH AND CORONARY ANGIOGRAPHY N/A 07/22/2018   Procedure: LEFT HEART CATH AND CORONARY ANGIOGRAPHY;  Surgeon: Leonie Man, MD;   Location: [facility];  Service: Cardiovascular;  Laterality: N/A;  . VENTRAL HERNIA REPAIR  06/2010   x3    Current Medications: Current Meds  Medication Sig  . aspirin 81 MG tablet Take 81 mg by mouth daily.    Marland Kitchen BAYER CONTOUR NEXT TEST test strip Use as instructed  . citalopram (CELEXA) 20 MG tablet Take 1 tablet (20 mg total) by mouth daily.  . cyclobenzaprine (FLEXERIL) 10 MG tablet Take 1 tablet (10 mg total) by mouth 3 (three) times daily as needed for muscle spasms. (Patient taking differently: Take 10 mg by mouth as needed for muscle spasms. )  . empagliflozin (JARDIANCE) 25 MG TABS tablet Take 25 mg by mouth daily.  . fenofibrate 54 MG tablet Take 1 tablet (54 mg total) by mouth daily.  Marland Kitchen gabapentin (NEURONTIN) 300 MG capsule TAKE (1) CAPSULE THREE TIMES DAILY. (Patient taking differently: Take 300 mg by mouth 3 (three) times daily. TAKE (1) CAPSULE THREE TIMES DAILY.)  . meloxicam (MOBIC) 15 MG tablet Take 7.5 mg by mouth daily. Whole tablet alternating with half tablet  . metoprolol tartrate (LOPRESSOR) 25 MG tablet Take 0.5 tablets (12.5 mg total) by mouth 2 (two) times daily.  . nitroGLYCERIN (NITROSTAT) 0.4 MG SL tablet Place 1 tablet (0.4 mg total) under the tongue every 5 (five) minutes as needed  for chest pain.  Marland Kitchen olmesartan-hydrochlorothiazide (BENICAR HCT) 20-12.5 MG tablet Take 1 tablet by mouth daily. (Patient taking differently: Take 0.5 tablets by mouth daily. )  . ONETOUCH DELICA LANCETS 53G MISC CHECK BLOOD SUGAR 2 TIMES A DAY  . pantoprazole (PROTONIX) 40 MG tablet Take 1 tablet (40 mg total) by mouth daily.  . prasugrel (EFFIENT) 10 MG TABS tablet Take 1 tablet (10 mg total) by mouth daily.     Allergies:   Advicor [niacin-lovastatin er]; Altace [ramipril]; Atorvastatin; Bextra [valdecoxib]; Bupropion hcl; Janumet [sitagliptin-metformin hcl]; Niacin; Rofecoxib; Sulfamethoxazole-trimethoprim; and Sulfonamide derivatives   Social History   Socioeconomic  History  . Marital status: Married    Spouse name: Not on file  . Number of children: Not on file  . Years of education: Not on file  . Highest education level: Not on file  Occupational History  . Not on file  Social Needs  . Financial resource strain: Not on file  . Food insecurity:    Worry: Not on file    Inability: Not on file  . Transportation needs:    Medical: Not on file    Non-medical: Not on file  Tobacco Use  . Smoking status: Former Smoker    Packs/day: 1.50    Years: 20.00    Pack years: 30.00    Types: Cigarettes    Last attempt to quit: 08/20/2002    Years since quitting: 16.2  . Smokeless tobacco: Never Used  Substance and Sexual Activity  . Alcohol use: No    Alcohol/week: 0.0 standard drinks  . Drug use: No  . Sexual activity: Not on file  Lifestyle  . Physical activity:    Days per week: Not on file    Minutes per session: Not on file  . Stress: Not on file  Relationships  . Social connections:    Talks on phone: Not on file    Gets together: Not on file    Attends religious service: Not on file    Active member of club or organization: Not on file    Attends meetings of clubs or organizations: Not on file    Relationship status: Not on file  Other Topics Concern  . Not on file  Social History Narrative   Married with 1 daughter     Family History: The patient's family history includes Breast cancer in his paternal aunt; Coronary artery disease in an other family member; Diabetes in his mother; Heart disease in his father and paternal uncle; Hyperlipidemia in his father and mother; Hypertension in his father and mother; Kidney disease in his father; Testicular cancer in his paternal uncle. There is no history of Colon cancer, Esophageal cancer, Stomach cancer, or Rectal cancer. ROS:   Please see the history of present illness.    All 14 point review of systems negative except as described per history of present illness  EKGs/Labs/Other Studies  Reviewed:      Recent Labs: 01/15/2018: ALT 44 07/23/2018: BUN 16; Creatinine, Ser 1.34; Hemoglobin 15.6; Platelets 227; Potassium 3.9; Sodium 140  Recent Lipid Panel    Component Value Date/Time   CHOL 161 10/13/2018 1649   TRIG 137 10/13/2018 1649   TRIG 472 (H) 09/14/2013 0854   HDL 33 (L) 10/13/2018 1649   HDL 35 (L) 09/14/2013 0854   CHOLHDL 4.9 10/13/2018 1649   LDLCALC 101 (H) 10/13/2018 1649   LDLCALC NOTES: 09/14/2013 0854    Physical Exam:    VS:  BP 130/66  Pulse 70   Ht 5\' 9"  (1.753 m)   Wt 233 lb 12.8 oz (106.1 kg)   SpO2 94%   BMI 34.53 kg/m     Wt Readings from Last 3 Encounters:  10/28/18 233 lb 12.8 oz (106.1 kg)  10/13/18 236 lb (107 kg)  09/09/18 236 lb (107 kg)     GEN:  Well nourished, well developed in no acute distress HEENT: Normal NECK: No JVD; No carotid bruits LYMPHATICS: No lymphadenopathy CARDIAC: RRR, no murmurs, no rubs, no gallops RESPIRATORY:  Clear to auscultation without rales, wheezing or rhonchi  ABDOMEN: Soft, non-tender, non-distended MUSCULOSKELETAL:  No edema; No deformity  SKIN: Warm and dry LOWER EXTREMITIES: no swelling NEUROLOGIC:  Alert and oriented x 3 PSYCHIATRIC:  Normal affect   ASSESSMENT:    1. Coronary artery disease involving native coronary artery of native heart without angina pectoris   2. Hypertension associated with diabetes ([place])   3. Statin intolerance    PLAN:    In order of problems listed above:  1. Coronary disease doing well from that point review asymptomatic and appropriate medication he is planning to do colonoscopy in the summertime if it 6 months after stent implantation will be able to hold Effient for period of 5 days around colonoscopy time if needed. 2. Essential hypertension blood pressure well controlled continue present management. 3. Statin intolerance will schedule our lipid clinic to initiate PCSK9 agent   Medication Adjustments/Labs and Tests Ordered: Current medicines are  reviewed at length with the patient today.  Concerns regarding medicines are outlined above.  No orders of the defined types were placed in this encounter.  Medication changes: No orders of the defined types were placed in this encounter.   Signed, Park Liter, MD, [facility] 10/28/2018 4:41 PM    [facility]

## 2018-11-03 ENCOUNTER — Telehealth: Payer: Self-pay | Admitting: Gastroenterology

## 2018-11-03 NOTE — Telephone Encounter (Signed)
David Black he should have a routine office visit to assess this. I don't think I have seen him since 2017 and things have changed. He is not on effient and if he is also on supplemental oxygen his procedure may need to be done at the hospital. Can book him for a clinic visit understanding that his procedure won't happen until June. Thanks

## 2018-11-03 NOTE — Telephone Encounter (Signed)
Pt wife called in wanting to know if pt has pre visit offc visit to discuss colon because pt is on bt. Can he then wait until June when he is off the bt to hv the colon? She is wanting the pre visit to be in April.Please return call and advise thanks

## 2018-11-03 NOTE — Telephone Encounter (Signed)
FYI Patient was seen by his cardiologist. The cardiologist wants him to stay on the Effient until for a full 6 months before holding it for the colonoscopy. (see the cardiology office visit note) The spouse is trying to make certain the timing is correct and wanted to schedule the office visit with GI now.  Patient cannot have the colonoscopy until June. The schedule does not go past 12/19/18. The consent form is good for 30 days. Spouse will call back mid April to schedule the office visit for the visit. I will put in a staff message as well to try to assist with coordinating this appointment.

## 2018-11-03 NOTE — Telephone Encounter (Signed)
Please see note below thanks.

## 2018-11-03 NOTE — Telephone Encounter (Signed)
Beth is handling [employer] this week, please route accordingly.

## 2018-11-25 NOTE — Telephone Encounter (Signed)
Left message to call back.  The purpose of calling back will be to get an appointment scheduled.

## 2018-11-25 NOTE — Telephone Encounter (Signed)
Is it reasonable to schedule his appointment with you at this time? It would be a May appointment.

## 2018-11-25 NOTE — Telephone Encounter (Signed)
Hi thanks. Can book him a virtual visit to discuss. I should have openings tomorrow if he is interested, or whenever he wishes where I have openings. Thanks

## 2018-11-27 ENCOUNTER — Other Ambulatory Visit: Payer: Self-pay | Admitting: Physician Assistant

## 2018-12-02 ENCOUNTER — Ambulatory Visit: Payer: Managed Care, Other (non HMO) | Admitting: Family Medicine

## 2018-12-24 ENCOUNTER — Ambulatory Visit (INDEPENDENT_AMBULATORY_CARE_PROVIDER_SITE_OTHER): Payer: Managed Care, Other (non HMO) | Admitting: Family Medicine

## 2018-12-24 ENCOUNTER — Other Ambulatory Visit: Payer: Self-pay

## 2018-12-24 DIAGNOSIS — F341 Dysthymic disorder: Secondary | ICD-10-CM | POA: Diagnosis not present

## 2018-12-24 MED ORDER — CITALOPRAM HYDROBROMIDE 40 MG PO TABS: 40.0000 mg | ORAL_TABLET | Freq: Every day | ORAL | 0 refills | Status: DC

## 2018-12-24 NOTE — Progress Notes (Signed)
Telephone visit  Subjective: CC:f/u Depression/ anxiety PCP: Janora Norlander, DO [id number] David Black is a 58 y.o. male calls for telephone consult today. Patient provides verbal consent for consult held via phone.  Location of patient: work Location of provider: Working remotely from home Others present for call: none  1. Depression/ anxiety Patient was last seen for issue in February.  At that time, he was having persistent symptoms though some improvement in symptoms on the Celexa.  His dose was increased to 20 mg daily.  He notes that he continues to have mood swings and cites an event recently where he became frustrated after work and took it out on his wife.  He identifies that this was not appropriate behavior and was apologetic about he wishes to eliminate these types of episodes.  He does think that he needs to go up on the dose of Celexa and is asking to do that today.  He continues to have quite a bit of stress at work.   ROS: Per HPI  Allergies  Allergen Reactions  . Advicor [Niacin-Lovastatin Er] Other (See Comments)    unknown  . Altace [Ramipril] Other (See Comments)    cough  . Atorvastatin Other (See Comments)    Muscle aches   . Bextra [Valdecoxib] Swelling  . Bupropion Hcl Other (See Comments)    Unknown  . Janumet [Sitagliptin-Metformin Hcl] Other (See Comments)    unknown  . Niacin Other (See Comments)    unknown  . Rofecoxib Swelling  . Sulfamethoxazole-Trimethoprim Other (See Comments)    unknown  . Sulfonamide Derivatives Other (See Comments)    unknown   Past Medical History:  Diagnosis Date  . CAD (coronary artery disease)    Minimal plaque on catheterization Nov 2011; EF normal by history  . Chest discomfort 07/18/2010   With exertion  . Depression with anxiety   . Diabetes mellitus without complication ([place])   . GERD (gastroesophageal reflux disease)   . Heart murmur   . Hyperlipidemia   . Hypertension    x 10 years  . Nocturnal  hypoxia 09/14/2013  . Obesity, unspecified   . Smoker    x 20 years, 1 1/2 ppd, quit 7 years ago  . SOB (shortness of breath) 07/18/2010    Current Outpatient Medications:  .  aspirin 81 MG tablet, Take 81 mg by mouth daily.  , Disp: , Rfl:  .  BAYER CONTOUR NEXT TEST test strip, Use as instructed, Disp: 100 each, Rfl: 2 .  citalopram (CELEXA) 20 MG tablet, Take 1 tablet (20 mg total) by mouth daily., Disp: 90 tablet, Rfl: 0 .  cyclobenzaprine (FLEXERIL) 10 MG tablet, Take 1 tablet (10 mg total) by mouth 3 (three) times daily as needed for muscle spasms. (Patient taking differently: Take 10 mg by mouth as needed for muscle spasms. ), Disp: 30 tablet, Rfl: 3 .  fenofibrate 54 MG tablet, Take 1 tablet (54 mg total) by mouth daily., Disp: 90 tablet, Rfl: 3 .  gabapentin (NEURONTIN) 300 MG capsule, TAKE (1) CAPSULE THREE TIMES DAILY. (Patient taking differently: Take 300 mg by mouth 3 (three) times daily. TAKE (1) CAPSULE THREE TIMES DAILY.), Disp: 270 capsule, Rfl: 3 .  JARDIANCE 25 MG TABS tablet, TAKE 1 TABLET DAILY, Disp: 30 tablet, Rfl: 2 .  meloxicam (MOBIC) 15 MG tablet, Take 7.5 mg by mouth daily. Whole tablet alternating with half tablet, Disp: , Rfl:  .  metoprolol tartrate (LOPRESSOR) 25 MG tablet, Take 0.5 tablets (  12.5 mg total) by mouth 2 (two) times daily., Disp: 30 tablet, Rfl: 6 .  nitroGLYCERIN (NITROSTAT) 0.4 MG SL tablet, Place 1 tablet (0.4 mg total) under the tongue every 5 (five) minutes as needed for chest pain., Disp: 30 tablet, Rfl: 2 .  olmesartan-hydrochlorothiazide (BENICAR HCT) 20-12.5 MG tablet, Take 1 tablet by mouth daily. (Patient taking differently: Take 0.5 tablets by mouth daily. ), Disp: 90 tablet, Rfl: 3 .  ONETOUCH DELICA LANCETS 98P MISC, CHECK BLOOD SUGAR 2 TIMES A DAY, Disp: 100 each, Rfl: 1 .  pantoprazole (PROTONIX) 40 MG tablet, Take 1 tablet (40 mg total) by mouth daily., Disp: 90 tablet, Rfl: 3 .  prasugrel (EFFIENT) 10 MG TABS tablet, Take 1 tablet (10  mg total) by mouth daily., Disp: 30 tablet, Rfl: 11  Depression screen [facility] 2/9 12/24/2018 10/13/2018 09/09/2018  Decreased Interest 0 0 0  Down, Depressed, Hopeless 0 0 0  PHQ - 2 Score 0 0 0  Altered sleeping - 0 0  Tired, decreased energy - 0 0  Change in appetite - 0 0  Feeling bad or failure about yourself  - 0 0  Trouble concentrating - 0 0  Moving slowly or fidgety/restless - 0 0  Suicidal thoughts - 0 0  PHQ-9 Score - 0 0  Difficult doing work/chores - - Not difficult at all   GAD 7 : Generalized Anxiety Score 12/24/2018 10/13/2018 08/28/2018  Nervous, Anxious, on Edge 1 1 2   Control/stop worrying 0 0 2  Worry too much - different things 1 1 2   Trouble relaxing 1 1 1   Restless 0 0 1  Easily annoyed or irritable 1 0 1  Afraid - awful might happen 0 0 0  Total GAD 7 Score 4 3 9    Assessment/ Plan: 58 y.o. male   1. DEPRESSION/ANXIETY Continues to have symptoms of moodiness but overall symptoms have improved since initiation of SSRI.  Increase to 40 mg daily.  We discussed that if he has ongoing stress and anxiety remaining consider adding an adjunct of medication like buspirone.  He seemed amenable to this.  I again reiterated to separate SSRI and NSAID if he takes them in the same day to reduce risk of GI bleed.  He will follow-up in the next 2 months, sooner if needed - citalopram (CELEXA) 40 MG tablet; Take 1 tablet (40 mg total) by mouth daily.  Dispense: 90 tablet; Refill: 0   Start time: 7:59am End time: 8:06am  Total time spent on patient care (including telephone call/ virtual visit): 15 minutes  [place], [place] [phone]

## 2019-01-02 ENCOUNTER — Other Ambulatory Visit: Payer: Self-pay | Admitting: Family Medicine

## 2019-01-02 DIAGNOSIS — F341 Dysthymic disorder: Secondary | ICD-10-CM

## 2019-01-13 ENCOUNTER — Telehealth: Payer: Self-pay | Admitting: Gastroenterology

## 2019-01-14 NOTE — Progress Notes (Signed)
Prescreened pt for tomorrow's appts. NOT on oxygen. Is taking Effient- Stents placed in Dec. 2019. Confirmed Doximity

## 2019-01-15 ENCOUNTER — Ambulatory Visit (INDEPENDENT_AMBULATORY_CARE_PROVIDER_SITE_OTHER): Payer: Managed Care, Other (non HMO) | Admitting: Gastroenterology

## 2019-01-15 ENCOUNTER — Encounter: Payer: Self-pay | Admitting: Gastroenterology

## 2019-01-15 ENCOUNTER — Other Ambulatory Visit: Payer: Self-pay

## 2019-01-15 ENCOUNTER — Telehealth: Payer: Self-pay

## 2019-01-15 VITALS — Ht 69.0 in | Wt 233.0 lb

## 2019-01-15 DIAGNOSIS — K219 Gastro-esophageal reflux disease without esophagitis: Secondary | ICD-10-CM | POA: Diagnosis not present

## 2019-01-15 DIAGNOSIS — Z1211 Encounter for screening for malignant neoplasm of colon: Secondary | ICD-10-CM | POA: Diagnosis not present

## 2019-01-15 DIAGNOSIS — Z7902 Long term (current) use of antithrombotics/antiplatelets: Secondary | ICD-10-CM | POA: Diagnosis not present

## 2019-01-15 MED ORDER — SUPREP BOWEL PREP KIT 17.5-3.13-1.6 GM/177ML PO SOLN: ORAL | 0 refills | Status: DC

## 2019-01-15 NOTE — Telephone Encounter (Signed)
 [facility] Pre-operative Risk Assessment     Request for surgical clearance:     Endoscopy Procedure  What type of surgery is being performed?     Endoscopy/colonoscopy  When is this surgery scheduled?     02-17-2019  What type of clearance is required ?   Pharmacy  Are there any medications that need to be held prior to surgery and how long? Effient 5 days  Practice name and name of physician performing surgery?   [facility]  What is your office phone and fax number?      Phone- [phone]  Fax- [phone] Attn: Lemar Lofty, CMA  Anesthesia type (None, local, MAC, general) ?  MAC

## 2019-01-15 NOTE — Progress Notes (Signed)
Virtual Visit via Video Note  I connected with David Black on 01/15/19 at  3:00 PM EDT by a video enabled telemedicine application and verified that I am speaking with the correct person using two identifiers.  I discussed the limitations of evaluation and management by telemedicine and the availability of in person appointments. The patient expressed understanding and agreed to proceed.  THIS ENCOUNTER IS A VIRTUAL VISIT DUE TO COVID-19 - PATIENT WAS NOT SEEN IN THE OFFICE. PATIENT HAS CONSENTED TO VIRTUAL VISIT / TELEMEDICINE VISIT VIA DOXIMITY APP   Location of patient: home Location of provider: office Persons participating: myself, patient  HPI :  58 y/o male here for a visit to discuss CRC screening. I have not seen him since January 2017. At that time we discussed colonoscopy and he wanted to proceed. Unfortunately he was not able to follow through with it due to other medical problems at the time. Wishes to be reassessed for colonoscopy.   He has a history of coronary artery disease status post PTCA and drug-eluting stent to circumflex artery as well as right coronary artery in December 2019. He is now on Effient and baby aspirin for this. Doing well from cardiac perspective, no shortness of breath or chest pains. He previously was on supplemental oxygen 3 years ago, but has not used it since then.    No trouble with bowels. No blood in the stools. No constipation or diarrhea. No abdominal pains. He feels well in this regard, no complaints. No FH of colon cancer.   He takes protonix 40mg  once daily for reflux. Works well to control it, he has been on this for some time. He denies any dysphagia. No esophageal cancer in the family. He had an EGD in his early 25s showing a subtle change at the GEJ per Dr. Deatra Ina, biopsies taken and showed inflammatory changes only at the time.  History of tobacco use, quit 15 years ago.   Echo 07/22/2018 - EF 55-60%  Colonoscopy 4/ 2006 - no  polyps   Past Medical History:  Diagnosis Date  . CAD (coronary artery disease)    Minimal plaque on catheterization Nov 2011; EF normal by history  . Chest discomfort 07/18/2010   With exertion  . Depression with anxiety   . Diabetes mellitus without complication ([place])   . GERD (gastroesophageal reflux disease)   . Heart murmur   . Hyperlipidemia   . Hypertension    x 10 years  . Nocturnal hypoxia 09/14/2013  . Obesity, unspecified   . Smoker    x 20 years, 1 1/2 ppd, quit 7 years ago  . SOB (shortness of breath) 07/18/2010     Past Surgical History:  Procedure Laterality Date  . CHOLECYSTECTOMY    . CORONARY STENT INTERVENTION N/A 07/22/2018   Procedure: CORONARY STENT INTERVENTION;  Surgeon: Leonie Man, MD;  Location: [facility];  Service: Cardiovascular;  Laterality: N/A;  . FINGER SURGERY     right hand,ring finger  . HERNIA REPAIR    . LEFT HEART CATH AND CORONARY ANGIOGRAPHY N/A 07/22/2018   Procedure: LEFT HEART CATH AND CORONARY ANGIOGRAPHY;  Surgeon: Leonie Man, MD;  Location: [facility];  Service: Cardiovascular;  Laterality: N/A;  . VENTRAL HERNIA REPAIR  06/2010   x3   Family History  Problem Relation Age of Onset  . Heart disease Father   . Hyperlipidemia Father   . Hypertension Father   . Kidney disease Father   .  Testicular cancer Paternal Uncle   . Heart disease Paternal Uncle   . Diabetes Mother   . Hyperlipidemia Mother   . Hypertension Mother   . Uterine cancer Mother 26  . Breast cancer Paternal Aunt   . Coronary artery disease Other   . Colon cancer Neg Hx   . Esophageal cancer Neg Hx   . Stomach cancer Neg Hx   . Rectal cancer Neg Hx    Social History   Tobacco Use  . Smoking status: Former Smoker    Packs/day: 1.50    Years: 20.00    Pack years: 30.00    Types: Cigarettes    Last attempt to quit: 08/20/2002    Years since quitting: 16.4  . Smokeless tobacco: Never Used  Substance Use Topics  . Alcohol  use: No    Alcohol/week: 0.0 standard drinks  . Drug use: No   Current Outpatient Medications  Medication Sig Dispense Refill  . aspirin 81 MG tablet Take 81 mg by mouth daily.      Marland Kitchen BAYER CONTOUR NEXT TEST test strip Use as instructed 100 each 2  . citalopram (CELEXA) 40 MG tablet Take 1 tablet (40 mg total) by mouth daily. 90 tablet 0  . cyclobenzaprine (FLEXERIL) 10 MG tablet Take 1 tablet (10 mg total) by mouth 3 (three) times daily as needed for muscle spasms. (Patient taking differently: Take 10 mg by mouth as needed for muscle spasms. ) 30 tablet 3  . fenofibrate 54 MG tablet Take 1 tablet (54 mg total) by mouth daily. 90 tablet 3  . gabapentin (NEURONTIN) 300 MG capsule TAKE (1) CAPSULE THREE TIMES DAILY. (Patient taking differently: Take 300 mg by mouth 3 (three) times daily. TAKE (1) CAPSULE THREE TIMES DAILY.) 270 capsule 3  . JARDIANCE 25 MG TABS tablet TAKE 1 TABLET DAILY 30 tablet 2  . meloxicam (MOBIC) 15 MG tablet Take 7.5 mg by mouth daily. Whole tablet alternating with half tablet    . metoprolol tartrate (LOPRESSOR) 25 MG tablet Take 0.5 tablets (12.5 mg total) by mouth 2 (two) times daily. 30 tablet 6  . nitroGLYCERIN (NITROSTAT) 0.4 MG SL tablet Place 1 tablet (0.4 mg total) under the tongue every 5 (five) minutes as needed for chest pain. 30 tablet 2  . olmesartan-hydrochlorothiazide (BENICAR HCT) 20-12.5 MG tablet Take 1 tablet by mouth daily. (Patient taking differently: Take 0.5 tablets by mouth daily. ) 90 tablet 3  . ONETOUCH DELICA LANCETS 16S MISC CHECK BLOOD SUGAR 2 TIMES A DAY 100 each 1  . pantoprazole (PROTONIX) 40 MG tablet Take 1 tablet (40 mg total) by mouth daily. 90 tablet 3  . prasugrel (EFFIENT) 10 MG TABS tablet Take 1 tablet (10 mg total) by mouth daily. 30 tablet 11   No current facility-administered medications for this visit.    Allergies  Allergen Reactions  . Advicor [Niacin-Lovastatin Er] Other (See Comments)    unknown  . Altace [Ramipril]  Other (See Comments)    cough  . Atorvastatin Other (See Comments)    Muscle aches   . Bextra [Valdecoxib] Swelling  . Bupropion Hcl Other (See Comments)    Unknown  . Janumet [Sitagliptin-Metformin Hcl] Other (See Comments)    unknown  . Niacin Other (See Comments)    unknown  . Rofecoxib Swelling  . Sulfamethoxazole-Trimethoprim Other (See Comments)    unknown  . Sulfonamide Derivatives Other (See Comments)    unknown     Review of Systems: All systems reviewed  and negative except where noted in HPI.   Lab Results  Component Value Date   WBC 7.7 07/23/2018   HGB 15.6 07/23/2018   HCT 47.7 07/23/2018   MCV 91.0 07/23/2018   PLT 227 07/23/2018    Lab Results  Component Value Date   CREATININE 1.34 (H) 07/23/2018   BUN 16 07/23/2018   NA 140 07/23/2018   K 3.9 07/23/2018   CL 104 07/23/2018   CO2 25 07/23/2018    Lab Results  Component Value Date   ALT 44 01/15/2018   AST 21 01/15/2018   ALKPHOS 45 01/15/2018   BILITOT 0.5 01/15/2018     Physical Exam: Ht 5\' 9"  (1.753 m) Comment: pt provided over the phone  Wt 233 lb (105.7 kg) Comment: pt provided over the phone  BMI 34.41 kg/m     ASSESSMENT AND PLAN: 58 y/o male here for reassessment of the following  Colon cancer screening / antiplatelet use - overdue for routine screening, we discussed options. He previously had wanted to proceed with optical colonoscopy but other medical problems delayed his procedure. Had DES placed in December, eligible to hold Effient in June for a procedure. I discussed risks / benefits of colonoscopy and anesthesia. He will need to hold Effient for 5 days prior to colonoscopy but can continue aspirin throughout that time. He wanted to proceed. Will schedule this for June and seek approval from his cardiologist to hold the Effient. He agreed.  GERD - longstanding reflux, well controlled with pantoprazole. He has no significant breakthrough, but given his longstanding symptoms,  age, ethnicity, history of tobacco use, he meets criteria for screening for Barrett's esophagus. I discussed EGD with him, he wishes to have this done at the same time as his colonoscopy. Further recommendations pending these results.   [place] Cellar, MD [facility]

## 2019-01-15 NOTE — Telephone Encounter (Signed)
Dr. Agustin Cree Pt is less than 1 year from PCI. Please comment on holding effient in June for endoscopy.

## 2019-01-15 NOTE — Patient Instructions (Addendum)
If you are age 56 or older, your body mass index should be between 23-30. Your Body mass index is 34.41 kg/m. If this is out of the aforementioned range listed, please consider follow up with your Primary Care Provider.  If you are age 56 or younger, your body mass index should be between 19-25. Your Body mass index is 34.41 kg/m. If this is out of the aformentioned range listed, please consider follow up with your Primary Care Provider.   To help prevent the possible spread of infection to our patients, communities, and staff; we will be implementing the following measures:  As of now we are not allowing any visitors/family members to accompany you to any upcoming appointments with [facility]. If you have any concerns about this please contact our office to discuss prior to the appointment.   You have been scheduled for an endoscopy and colonoscopy on Tuesday, 02-17-2019 at 3:00pm. Please follow the written instructions sent to [facility] today . Please pick up your prep supplies at the pharmacy within the next 1-3 days. If you use inhalers (even only as needed), please bring them with you on the day of your procedure. Your physician has requested that you go to www.startemmi.com and enter the access code given to you at your visit today. This web site gives a general overview about your procedure. However, you should still follow specific instructions given to you by our office regarding your preparation for the procedure.  You will be contacted by our office prior to your procedure for directions on holding your Effient.  If you do not hear from our office 1 week prior to your scheduled procedure, please call [phone] to discuss.   Thank you for entrusting me with your care and for choosing [facility], Dr. Wyndham Cellar

## 2019-01-16 NOTE — Telephone Encounter (Signed)
Thanks for the follow up. The exam is for screening purposes and non-urgent. If you think safe to delay this by a few more months I think that is okay, please let us know when would be best. Thanks much

## 2019-01-16 NOTE — Telephone Encounter (Signed)
It is acceptable to hold Effient for 5 days 6 months after DES ( done in December 2019). There are always some risk, overall low.  How urgent  is colonoscopy?

## 2019-01-19 ENCOUNTER — Telehealth: Payer: Self-pay

## 2019-01-19 NOTE — Telephone Encounter (Signed)
Will route to Dr. Havery Moros to make him aware. 12 months would be after 07/2019. Patient has follow-up in August 2020 with Dr. Agustin Cree at which time the ideal timing of antiplatelet cessation can be revisited.  Will remove this message from the preop APP box. Dr. Havery Moros, please let us know if we can be of any further assistance in the immediate future. Otherwise we will await your repeat clearance request closer to the time of rescheduled procedure. Thank you.  David Aird PA-C

## 2019-01-19 NOTE — Telephone Encounter (Signed)
Left message for patient to call back, reference waiting till 12/20 to do EGD/Colon. Put recall in Epic

## 2019-01-19 NOTE — Telephone Encounter (Signed)
If this is elective colonoscopy it is safer to wait until 12 month after PTCA with DES

## 2019-01-19 NOTE — Telephone Encounter (Signed)
Thanks much for the follow up. If waiting to 07/2019 for this is exam is best from his cardiovascular perspective that is okay with me.  [name] can you please relay to the patient that we have been advised that it would be best to wait until December for his colonoscopy and EGD given these are screening exams and we do not want to interrupt his antiplatelet regimen if possible. Can you please place a recall EGD and colonoscopy for December for this patient and let him know.Thanks

## 2019-01-19 NOTE — Telephone Encounter (Signed)
I am covering pre-op box today. We will await clarification from Dr. Agustin Cree in reference to Dr. Doyne Keel question below. Patient is s/p PCI for [place] in 07/2018. Dr. Agustin Cree, when you reply, please route to Dr. Havery Moros as well as P [facility] DIV PREOP. Thank you.  Brendon Christoffel PA-C

## 2019-01-20 ENCOUNTER — Telehealth: Payer: Self-pay | Admitting: Gastroenterology

## 2019-01-20 ENCOUNTER — Telehealth: Payer: Self-pay | Admitting: Cardiology

## 2019-01-20 NOTE — Telephone Encounter (Signed)
New Message    Pts wife is calling and says a nurse needs to call her today. She said her husband was not cleared for surgery and that Dr Agustin Cree told them in March that he could have the surgery.    Please call

## 2019-01-20 NOTE — Telephone Encounter (Signed)
Mcarthur Rossetti (nurse with Dr. Wendy Poet office. Let her know the order for canceling patient's EGD/Colon came for telephone note yesterday 01/19/19 between Melina Copa PA/Dr. Agustin Cree and Dr. Havery Moros. She now understands and will touch base with Dr. Agustin Cree on what he wants to do since patient's Ins. Resets 05/21/19 and a new deductible would need to be met.

## 2019-01-20 NOTE — Telephone Encounter (Signed)
I tried to call the pt, no answer,  In this case we can compromize.  He shoud be schedule for procedure in September, that will be 9 monts post PTCA and hi will be still within deductable period

## 2019-01-20 NOTE — Telephone Encounter (Signed)
Phoned David Black, informed Dr. Agustin Cree will give clearance for procedure in September. She is upset because they will be on vacation in September and thinks it will be hard to schedule an appt, also because they will need to pay another copay to meet with anesthesia provider again within 60 days of procedure.  Informed that I spoke with Ester at GI clinic who states procedure schedule is generally very open and should be no problem to get in prior to end of September.  David Black would like something in writing from Dr. Agustin Cree now clearing patient for procedure in September.  Please advise, tx

## 2019-01-20 NOTE — Telephone Encounter (Signed)
Called and spoke to patient (and wife called back) reference rescheduling EGD/Colon for 12/20 because of Cardiologist not wanting patient to interrupt his antiplatelet regimen yet. Wife is very upset because said they had gotten the Cardiologist approval to come off of it at patient's last office visit. And patient has met his deductible, which starts over before Dec. (Oct. ?)Wife said she was going to call the Cardiologist and talk to them

## 2019-01-20 NOTE — Telephone Encounter (Signed)
Thanks for letting me know. We can proceed only as cardiology gives clearance. If they allow [place] to hold antiplatelet regimen sooner we can do it sooner, thanks

## 2019-01-20 NOTE — Telephone Encounter (Signed)
Called julia back, she is busy. She will call me back

## 2019-01-20 NOTE — Telephone Encounter (Signed)
Phoned patient's wife Traci who states [facility] Dr. Doyne Keel office cancelled patient's colonoscopy/upper GI procedure in July. States GI office contacted Dr. Wendy Poet office for clearance which was denied due to pt needing  to be on blood thinner for 1 year continuously  post PCTA .      Mrs. Mohamed states at March visit with Dr. Agustin Cree they were told patient could come off blood thinner for 5 days for procedure after 6 months on blood thinner.      Pt has met high out of pocket deductible for the year and need to have procedure before October. GI clinic cancelled procedure and is unable to rebook for several months.   pls advise.

## 2019-01-20 NOTE — Telephone Encounter (Signed)
Gregary Signs from doctor Dr. Caryl Pina called back wanting a call from the nurse.

## 2019-01-20 NOTE — Telephone Encounter (Signed)
Patient wife called said she had a call from the cardiology and decided  that they want him to wait to have the procedure in sept. She would like to hear it from you if he would be to have another office visit for when he comes in September.  Her best call back # is  [phone]

## 2019-01-20 NOTE — Telephone Encounter (Signed)
Pt pcp offc called requesting to speak with the nurse. She advised that pt wife called in very upset because she was informed that the pcp offc did not sign off on the pt been off bt for his procedure. She is wanting  A call back soon as possible to discuss the matter. [place]ord w/Dr.Krasowski offc

## 2019-01-20 NOTE — Telephone Encounter (Signed)
See phone note

## 2019-01-20 NOTE — Telephone Encounter (Signed)
Spoke with patient on phone and notified him that Dr. Agustin Cree thinks it best not to interrupt his antiplatelet threapty

## 2019-01-21 NOTE — Telephone Encounter (Signed)
I spoke to David Black on the phone,  I explain to him why we are postponing colonoscopy. He is ok with it and understand. Please pass the message to GI that he can have his colonoscopy in September and they can schedule him to have it done in September

## 2019-01-22 ENCOUNTER — Telehealth: Payer: Self-pay

## 2019-01-22 NOTE — Telephone Encounter (Signed)
David Black, he should call back in August to schedule a procedure for September. thanks

## 2019-01-22 NOTE — Telephone Encounter (Signed)
Gregary Signs (Nurse from Dr. Wendy Poet office) called and wanted to let us know that he does not want the patient to come off his anticoag. For a procedure until Sept.

## 2019-01-22 NOTE — Telephone Encounter (Signed)
-----   Message from Joelyn Oms sent at 01/21/2019  3:01 PM EDT ----- Gregary Signs from cardiology would like to speak to you about pt. You can reach her @ [phone]

## 2019-01-22 NOTE — Telephone Encounter (Signed)
Informed David Black that Dr. Agustin Cree is giving clearance to come off of anticoagulation in September for GI procedures.  Per facility protocol, Anesthesiology provider will have to reach out to Dr. Agustin Cree a couple weeks before procedure to get clearance closer to procedure date.  Information noted.

## 2019-01-22 NOTE — Telephone Encounter (Signed)
Attempted calling Gregary Signs from cardiology back, on hold for sometime. Will try again later

## 2019-01-27 NOTE — Telephone Encounter (Signed)
On 01/22/19, [name],  RN at [facility] informed that Dr. Agustin Cree OK's pt to have procedure in September. She verbalized understanding and will coordinate scheduling with patient for this time.

## 2019-02-02 ENCOUNTER — Other Ambulatory Visit: Payer: Self-pay | Admitting: Physician Assistant

## 2019-02-17 ENCOUNTER — Encounter: Payer: Managed Care, Other (non HMO) | Admitting: Gastroenterology

## 2019-02-25 ENCOUNTER — Encounter: Payer: Managed Care, Other (non HMO) | Admitting: Gastroenterology

## 2019-03-02 ENCOUNTER — Telehealth: Payer: Self-pay | Admitting: Gastroenterology

## 2019-03-02 NOTE — Telephone Encounter (Signed)
Patient wife called would like to know if the patient needs another office visit to have his procedure and if he does needs a visit she wants to know how far out the procedure will be scheduled. Her CB# [phone].

## 2019-03-03 ENCOUNTER — Telehealth: Payer: Self-pay

## 2019-03-03 ENCOUNTER — Other Ambulatory Visit: Payer: Self-pay | Admitting: Physician Assistant

## 2019-03-03 NOTE — Telephone Encounter (Signed)
Patient's wife is wanting to schedule patient's EGD and Colonoscopy for Sept. Does the patient need a office visit before that, or can I just schedule the procedures for Sept. In the [place] ? Thanks

## 2019-03-03 NOTE — Telephone Encounter (Signed)
Sent message via My Chart with information about patient's EGD/Colonscopy scheduling

## 2019-03-03 NOTE — Telephone Encounter (Signed)
Thanks Customer service manager. He does not need another clinic appointment to proceed with this. We can directly book for these in September at the [facility] once the schedule is released. His cardiologist has already cleared him to hold his antiplatelet therapy at that time, but will need to confirm again once he is booked. Thanks

## 2019-03-03 NOTE — Telephone Encounter (Signed)
I have already discussed this with [name] in another telephone note - see that note for details

## 2019-03-17 ENCOUNTER — Telehealth: Payer: Self-pay | Admitting: Gastroenterology

## 2019-03-23 ENCOUNTER — Encounter: Payer: Self-pay | Admitting: Gastroenterology

## 2019-03-23 ENCOUNTER — Ambulatory Visit: Payer: Managed Care, Other (non HMO) | Admitting: Cardiology

## 2019-03-30 LAB — HM DIABETES EYE EXAM

## 2019-04-02 ENCOUNTER — Other Ambulatory Visit: Payer: Self-pay | Admitting: Family Medicine

## 2019-04-02 ENCOUNTER — Other Ambulatory Visit: Payer: Self-pay | Admitting: Physician Assistant

## 2019-04-02 DIAGNOSIS — F341 Dysthymic disorder: Secondary | ICD-10-CM

## 2019-04-06 ENCOUNTER — Other Ambulatory Visit: Payer: Self-pay

## 2019-04-06 ENCOUNTER — Encounter: Payer: Self-pay | Admitting: Cardiology

## 2019-04-06 ENCOUNTER — Ambulatory Visit (INDEPENDENT_AMBULATORY_CARE_PROVIDER_SITE_OTHER): Payer: Managed Care, Other (non HMO) | Admitting: Cardiology

## 2019-04-06 VITALS — BP 126/70 | HR 72 | Ht 69.0 in | Wt 250.4 lb

## 2019-04-06 DIAGNOSIS — E782 Mixed hyperlipidemia: Secondary | ICD-10-CM

## 2019-04-06 DIAGNOSIS — I10 Essential (primary) hypertension: Secondary | ICD-10-CM

## 2019-04-06 DIAGNOSIS — I2510 Atherosclerotic heart disease of native coronary artery without angina pectoris: Secondary | ICD-10-CM | POA: Diagnosis not present

## 2019-04-06 MED ORDER — PRASUGREL HCL 10 MG PO TABS: 10.0000 mg | ORAL_TABLET | Freq: Every day | ORAL | 1 refills | Status: DC

## 2019-04-06 NOTE — Progress Notes (Signed)
Cardiology Office Note:    Date:  04/06/2019   ID:  David Black, DOB 01-Jan-1961, MRN [id number]  PCP:  Janora Norlander, DO  Cardiologist:  Jenne Campus, MD    Referring MD: Janora Norlander, DO   Chief Complaint  Patient presents with  . Follow-up  Doing very well  History of Present Illness:    David Black is a 58 y.o. male with coronary artery disease, status post PTCA and stenting of proximal circumflex and 95% distal RCA lesion in December 2019.  Hypertension dyslipidemia with intolerance to multiple anticholesterol medication.  He comes today to my office for follow-up overall doing great asymptomatic no chest pain tightness squeezing pressure burning chest.  Past Medical History:  Diagnosis Date  . CAD (coronary artery disease)    Minimal plaque on catheterization Nov 2011; EF normal by history  . Chest discomfort 07/18/2010   With exertion  . Depression with anxiety   . Diabetes mellitus without complication ([place])   . GERD (gastroesophageal reflux disease)   . Heart murmur   . Hyperlipidemia   . Hypertension    x 10 years  . Nocturnal hypoxia 09/14/2013  . Obesity, unspecified   . Smoker    x 20 years, 1 1/2 ppd, quit 7 years ago  . SOB (shortness of breath) 07/18/2010    Past Surgical History:  Procedure Laterality Date  . CHOLECYSTECTOMY    . CORONARY STENT INTERVENTION N/A 07/22/2018   Procedure: CORONARY STENT INTERVENTION;  Surgeon: Leonie Man, MD;  Location: [facility];  Service: Cardiovascular;  Laterality: N/A;  . FINGER SURGERY     right hand,ring finger  . HERNIA REPAIR    . LEFT HEART CATH AND CORONARY ANGIOGRAPHY N/A 07/22/2018   Procedure: LEFT HEART CATH AND CORONARY ANGIOGRAPHY;  Surgeon: Leonie Man, MD;  Location: [facility];  Service: Cardiovascular;  Laterality: N/A;  . VENTRAL HERNIA REPAIR  06/2010   x3    Current Medications: Current Meds  Medication Sig  . aspirin 81 MG tablet Take 81 mg by  mouth daily.    Marland Kitchen BAYER CONTOUR NEXT TEST test strip Use as instructed  . citalopram (CELEXA) 40 MG tablet Take 1 tablet (40 mg total) by mouth daily.  . cyclobenzaprine (FLEXERIL) 10 MG tablet Take 1 tablet (10 mg total) by mouth 3 (three) times daily as needed for muscle spasms. (Patient taking differently: Take 10 mg by mouth as needed for muscle spasms. )  . fenofibrate 54 MG tablet Take 1 tablet (54 mg total) by mouth daily.  Marland Kitchen gabapentin (NEURONTIN) 300 MG capsule TAKE (1) CAPSULE THREE TIMES DAILY. (Patient taking differently: Take 300 mg by mouth 3 (three) times daily. TAKE (1) CAPSULE THREE TIMES DAILY.)  . JARDIANCE 25 MG TABS tablet TAKE 1 TABLET DAILY  . meloxicam (MOBIC) 15 MG tablet Take 7.5 mg by mouth daily. Whole tablet alternating with half tablet  . metoprolol tartrate (LOPRESSOR) 25 MG tablet Take 1/2 tablet (12.5 mg total) by mouth 2 (two) times daily.  . nitroGLYCERIN (NITROSTAT) 0.4 MG SL tablet Place 1 tablet (0.4 mg total) under the tongue every 5 (five) minutes as needed for chest pain.  Marland Kitchen olmesartan-hydrochlorothiazide (BENICAR HCT) 20-12.5 MG tablet Take 1 tablet by mouth daily. (Patient taking differently: Take 0.5 tablets by mouth daily. )  . ONETOUCH DELICA LANCETS 66Q MISC CHECK BLOOD SUGAR 2 TIMES A DAY  . pantoprazole (PROTONIX) 40 MG tablet Take 1 tablet (  40 mg total) by mouth daily.  . prasugrel (EFFIENT) 10 MG TABS tablet Take 1 tablet (10 mg total) by mouth daily.     Allergies:   Advicor [niacin-lovastatin er], Altace [ramipril], Atorvastatin, Bextra [valdecoxib], Bupropion hcl, Janumet [sitagliptin-metformin hcl], Niacin, Rofecoxib, Sulfamethoxazole-trimethoprim, and Sulfonamide derivatives   Social History   Socioeconomic History  . Marital status: Married    Spouse name: Not on file  . Number of children: Not on file  . Years of education: Not on file  . Highest education level: Not on file  Occupational History  . Not on file  Social Needs  .  Financial resource strain: Not on file  . Food insecurity    Worry: Not on file    Inability: Not on file  . Transportation needs    Medical: Not on file    Non-medical: Not on file  Tobacco Use  . Smoking status: Former Smoker    Packs/day: 1.50    Years: 20.00    Pack years: 30.00    Types: Cigarettes    Quit date: 08/20/2002    Years since quitting: 16.6  . Smokeless tobacco: Never Used  Substance and Sexual Activity  . Alcohol use: No    Alcohol/week: 0.0 standard drinks  . Drug use: No  . Sexual activity: Not on file  Lifestyle  . Physical activity    Days per week: Not on file    Minutes per session: Not on file  . Stress: Not on file  Relationships  . Social Herbalist on phone: Not on file    Gets together: Not on file    Attends religious service: Not on file    Active member of club or organization: Not on file    Attends meetings of clubs or organizations: Not on file    Relationship status: Not on file  Other Topics Concern  . Not on file  Social History Narrative   Married with 1 daughter     Family History: The patient's family history includes Breast cancer in his paternal aunt; Coronary artery disease in an other family member; Diabetes in his mother; Heart disease in his father and paternal uncle; Hyperlipidemia in his father and mother; Hypertension in his father and mother; Kidney disease in his father; Testicular cancer in his paternal uncle; Uterine cancer (age of onset: 56) in his mother. There is no history of Colon cancer, Esophageal cancer, Stomach cancer, or Rectal cancer. ROS:   Please see the history of present illness.    All 14 point review of systems negative except as described per history of present illness  EKGs/Labs/Other Studies Reviewed:      Recent Labs: 07/23/2018: BUN 16; Creatinine, Ser 1.34; Hemoglobin 15.6; Platelets 227; Potassium 3.9; Sodium 140  Recent Lipid Panel    Component Value Date/Time   CHOL 161  10/13/2018 1649   TRIG 137 10/13/2018 1649   TRIG 472 (H) 09/14/2013 0854   HDL 33 (L) 10/13/2018 1649   HDL 35 (L) 09/14/2013 0854   CHOLHDL 4.9 10/13/2018 1649   LDLCALC 101 (H) 10/13/2018 1649   LDLCALC NOTES: 09/14/2013 0854    Physical Exam:    VS:  BP 126/70   Pulse 72   Ht 5\' 9"  (1.753 m)   Wt 250 lb 6.4 oz (113.6 kg)   SpO2 98%   BMI 36.98 kg/m     Wt Readings from Last 3 Encounters:  04/06/19 250 lb 6.4 oz (113.6 kg)  01/15/19 233 lb (105.7 kg)  10/28/18 233 lb 12.8 oz (106.1 kg)     GEN:  Well nourished, well developed in no acute distress HEENT: Normal NECK: No JVD; No carotid bruits LYMPHATICS: No lymphadenopathy CARDIAC: RRR, no murmurs, no rubs, no gallops RESPIRATORY:  Clear to auscultation without rales, wheezing or rhonchi  ABDOMEN: Soft, non-tender, non-distended MUSCULOSKELETAL:  No edema; No deformity  SKIN: Warm and dry LOWER EXTREMITIES: no swelling NEUROLOGIC:  Alert and oriented x 3 PSYCHIATRIC:  Normal affect   ASSESSMENT:    1. Coronary artery disease involving native coronary artery of native heart without angina pectoris   2. Mixed hyperlipidemia   3. Essential hypertension    PLAN:    In order of problems listed above:  1. Coronary disease stable from that point of view on appropriate medications which I will continue.  He is scheduled to have colonoscopy in September which is fine from my standpoint of view.  His Effient can be stopped 5 days before procedure and then should be restarted back after it.  He need to continue with Effient until December where this medication will be completed discontinue. 2. Mixed dyslipidemia he was referred to our lipid clinic but because of COVID-19 situation he did not see anybody will reschedule him for it. 3. Essential hypertension blood pressure well controlled 4. We talked about healthy lifestyle good diet and exercise on a regular basis.   Medication Adjustments/Labs and Tests Ordered:  Current medicines are reviewed at length with the patient today.  Concerns regarding medicines are outlined above.  No orders of the defined types were placed in this encounter.  Medication changes: No orders of the defined types were placed in this encounter.   Signed, Park Liter, MD, [facility] 04/06/2019 5:07 PM    [place]

## 2019-04-06 NOTE — Patient Instructions (Addendum)
Medication Instructions:  Your physician recommends that you continue on your current medications as directed. Please refer to the Current Medication list given to you today.  If you need a refill on your cardiac medications before your next appointment, please call your pharmacy.   Lab work: None.  If you have labs (blood work) drawn today and your tests are completely normal, you will receive your results only by: Marland Kitchen MyChart Message (if you have MyChart) OR . A paper copy in the mail If you have any lab test that is abnormal or we need to change your treatment, we will call you to review the results.  Testing/Procedures: None.   Follow-Up: At [facility], you and your health needs are our priority.  As part of our continuing mission to provide you with exceptional heart care, we have created designated Provider Care Teams.  These Care Teams include your primary Cardiologist (physician) and Advanced Practice Providers (APPs -  Physician Assistants and Nurse Practitioners) who all work together to provide you with the care you need, when you need it. You will need a follow up appointment in 6 months.  Please call our office 2 months in advance to schedule this appointment.  You may see Jenne Campus, MD or another member of our [place] Provider Team in [place]: Shirlee More, MD . Jyl Heinz, MD  Any Other Special Instructions Will Be Listed Below (If Applicable).  Dr. Agustin Cree has referred you to the lipid clinic their office should call you within 1 week for an appointment. If not please call our office.

## 2019-04-07 ENCOUNTER — Telehealth: Payer: Self-pay | Admitting: Cardiology

## 2019-04-07 NOTE — Telephone Encounter (Signed)
LVM for patient to call and schedule an appointment with the pharmacist in the lipid clinic.

## 2019-04-13 ENCOUNTER — Other Ambulatory Visit: Payer: Self-pay

## 2019-04-13 ENCOUNTER — Telehealth: Payer: Self-pay | Admitting: Family Medicine

## 2019-04-13 DIAGNOSIS — F341 Dysthymic disorder: Secondary | ICD-10-CM

## 2019-04-13 MED ORDER — CITALOPRAM HYDROBROMIDE 40 MG PO TABS: 40.0000 mg | ORAL_TABLET | Freq: Every day | ORAL | 0 refills | Status: DC

## 2019-04-13 MED ORDER — METOPROLOL TARTRATE 25 MG PO TABS: ORAL_TABLET | ORAL | 0 refills | Status: DC

## 2019-04-13 NOTE — Telephone Encounter (Signed)
Celexa prescription sent to [facility], patient aware.

## 2019-04-14 ENCOUNTER — Encounter: Payer: Managed Care, Other (non HMO) | Admitting: Family Medicine

## 2019-04-22 ENCOUNTER — Ambulatory Visit (AMBULATORY_SURGERY_CENTER): Payer: Self-pay | Admitting: *Deleted

## 2019-04-22 ENCOUNTER — Other Ambulatory Visit: Payer: Self-pay

## 2019-04-22 VITALS — Temp 96.8°F | Ht 69.0 in | Wt 250.0 lb

## 2019-04-22 DIAGNOSIS — Z1211 Encounter for screening for malignant neoplasm of colon: Secondary | ICD-10-CM

## 2019-04-22 DIAGNOSIS — K219 Gastro-esophageal reflux disease without esophagitis: Secondary | ICD-10-CM

## 2019-04-22 MED ORDER — NA SULFATE-K SULFATE-MG SULF 17.5-3.13-1.6 GM/177ML PO SOLN: ORAL | 0 refills | Status: DC

## 2019-04-22 NOTE — Progress Notes (Signed)
Patient is here in-person for PV. Patient denies any allergies to eggs or soy. Patient denies any problems with anesthesia/sedation. Patient denies any oxygen use at home. Patient denies taking any diet/weight loss medications. On blood thinner, see TE. EMMI education assisgned to patient on colonoscopy &EGD, this was explained and instructions given to patient.Pt is aware that care partner will wait in the car during procedure; if they feel like they will be too hot to wait in the car; they may wait in the lobby.  We want them to wear a mask (we do not have any that we can provide them), practice social distancing, and we will check their temperatures when they get here.  I did remind patient that their care partner needs to stay in the parking lot the entire time. Pt will wear mask into building. Suprep coupon given to pt.

## 2019-04-24 ENCOUNTER — Encounter: Payer: Self-pay | Admitting: Gastroenterology

## 2019-05-08 ENCOUNTER — Ambulatory Visit (AMBULATORY_SURGERY_CENTER): Payer: Managed Care, Other (non HMO) | Admitting: Gastroenterology

## 2019-05-08 ENCOUNTER — Other Ambulatory Visit: Payer: Self-pay | Admitting: Gastroenterology

## 2019-05-08 ENCOUNTER — Encounter: Payer: Self-pay | Admitting: Gastroenterology

## 2019-05-08 ENCOUNTER — Other Ambulatory Visit: Payer: Self-pay

## 2019-05-08 VITALS — BP 118/66 | HR 60 | Temp 97.9°F | Resp 18 | Ht 69.0 in | Wt 250.0 lb

## 2019-05-08 DIAGNOSIS — K620 Anal polyp: Secondary | ICD-10-CM

## 2019-05-08 DIAGNOSIS — K219 Gastro-esophageal reflux disease without esophagitis: Secondary | ICD-10-CM | POA: Diagnosis not present

## 2019-05-08 DIAGNOSIS — D123 Benign neoplasm of transverse colon: Secondary | ICD-10-CM | POA: Diagnosis not present

## 2019-05-08 DIAGNOSIS — Z1211 Encounter for screening for malignant neoplasm of colon: Secondary | ICD-10-CM | POA: Diagnosis not present

## 2019-05-08 DIAGNOSIS — K317 Polyp of stomach and duodenum: Secondary | ICD-10-CM

## 2019-05-08 DIAGNOSIS — D128 Benign neoplasm of rectum: Secondary | ICD-10-CM

## 2019-05-08 MED ORDER — SODIUM CHLORIDE 0.9 % IV SOLN: 500.0000 mL | Freq: Once | INTRAVENOUS | Status: AC

## 2019-05-08 NOTE — Progress Notes (Signed)
Temp check by AR/vital check by CW.  No changes to history since pre-visit screening.

## 2019-05-08 NOTE — Op Note (Signed)
[facility] Patient Name: Melody Kircher Procedure Date: 05/08/2019 8:03 AM MRN: [id number] Endoscopist: Remo Lipps P. Havery Moros , MD Age: 58 Referring MD:  Date of Birth: [birth year] Gender: Male Account #: [id number] Procedure:                Colonoscopy Indications:              Screening for colorectal malignant neoplasm Medicines:                Monitored Anesthesia Care Procedure:                Pre-Anesthesia Assessment:                           - Prior to the procedure, a History and Physical                            was performed, and patient medications and                            allergies were reviewed. The patient's tolerance of                            previous anesthesia was also reviewed. The risks                            and benefits of the procedure and the sedation                            options and risks were discussed with the patient.                            All questions were answered, and informed consent                            was obtained. Prior Anticoagulants: The patient has                            taken Effient (prasugrel), last dose was 5 days                            prior to procedure. After reviewing the risks and                            benefits, the patient was deemed in satisfactory                            condition to undergo the procedure.                           After obtaining informed consent, the colonoscope                            was passed under direct vision. Throughout the  procedure, the patient's blood pressure, pulse, and                            oxygen saturations were monitored continuously. The                            Colonoscope was introduced through the anus and                            advanced to the the cecum, identified by                            appendiceal orifice and ileocecal valve. The                            colonoscopy was performed  without difficulty. The                            patient tolerated the procedure well. The quality                            of the bowel preparation was good. The ileocecal                            valve, appendiceal orifice, and rectum were                            photographed. Scope In: [id number] AM Scope Out: [id number] AM Scope Withdrawal Time: 0 hours 14 minutes 17 seconds  Total Procedure Duration: 0 hours 20 minutes 27 seconds  Findings:                 The perianal and digital rectal examinations were                            normal.                           A 3 mm polyp was found in the transverse colon. The                            polyp was sessile. The polyp was removed with a                            cold snare. Resection and retrieval were complete.                           A 3 mm polyp was found in the sigmoid colon. The                            polyp was sessile. The polyp was removed with a                            cold snare. Resection and retrieval were complete.  A 3 to 4 mm polyp was found in the rectum. The                            polyp was sessile. The polyp was removed with a                            cold snare. Resection and retrieval were complete.                           A suspected hypertrophied anal papilla(e) was noted                            on retroflexion with a broad base. Biopsies were                            taken from the pedunculated area and base with a                            cold forceps for histology to rule out AIN.                           There was a very small AVM noted in the cecum. The                            exam was otherwise without abnormality. Complications:            No immediate complications. Estimated blood loss:                            Minimal. Estimated Blood Loss:     Estimated blood loss was minimal. Impression:               - One 3 mm polyp in the transverse  colon, removed                            with a cold snare. Resected and retrieved.                           - One 3 mm polyp in the sigmoid colon, removed with                            a cold snare. Resected and retrieved.                           - One 3 to 4 mm polyp in the rectum, removed with a                            cold snare. Resected and retrieved.                           - A suspected hypertrophied benign anal papillae.  Biopsied.                           - Small cecal AVM                           - The examination was otherwise normal. Recommendation:           - Patient has a contact number available for                            emergencies. The signs and symptoms of potential                            delayed complications were discussed with the                            patient. Return to normal activities tomorrow.                            Written discharge instructions were provided to the                            patient.                           - Resume previous diet.                           - Continue present medications.                           - Resume Effient tomorrow                           - Await pathology results. Remo Lipps P. Havery Moros, MD 05/08/2019 8:42:22 AM This report has been signed electronically.

## 2019-05-08 NOTE — Progress Notes (Signed)
Pt. Doing well in recovery. Called patient's wife X3 to ask her to get the car and give her a report, No answer. Went out to lobby and spoke with pt's wife, gave her discharge instructions. Pt's wife asks what diagnosis code was used for for pt. State she also had asked admitting, but was concerned the insurance company would have a problem with the code. I asked patient to call Dr. Doyne Keel office on the 3rd floor if she had any insurance questions/concerns. States she will do so. Several minutes later, patient has returned to front desk and asks what meds patient has received. I am currently recovering another patient and unable to leave recovery so I wrote this down and gave to CMA to give to her. Wife now asks exactly how much Propofol pt. Has received. Nurse supervisor made aware and followed up with her for resolution.

## 2019-05-08 NOTE — Op Note (Signed)
[facility] Patient Name: David Black Procedure Date: 05/08/2019 8:03 AM MRN: [id number] Endoscopist: Remo Lipps P. Havery Moros , MD Age: 58 Referring MD:  Date of Birth: [birth year] Gender: Male Account #: [id number] Procedure:                Upper GI endoscopy Indications:              Screening for Barrett's esophagus, history of                            reflux, controlled on protonix 40mg  once daily Medicines:                Monitored Anesthesia Care Procedure:                Pre-Anesthesia Assessment:                           - Prior to the procedure, a History and Physical                            was performed, and patient medications and                            allergies were reviewed. The patient's tolerance of                            previous anesthesia was also reviewed. The risks                            and benefits of the procedure and the sedation                            options and risks were discussed with the patient.                            All questions were answered, and informed consent                            was obtained. Prior Anticoagulants: The patient has                            taken Effient (prasugrel), last dose was 5 days                            prior to procedure. After reviewing the risks and                            benefits, the patient was deemed in satisfactory                            condition to undergo the procedure.                           After obtaining informed consent, the endoscope was  passed under direct vision. Throughout the                            procedure, the patient's blood pressure, pulse, and                            oxygen saturations were monitored continuously. The                            Endoscope was introduced through the mouth, and                            advanced to the second part of duodenum. The upper                            GI endoscopy  was accomplished without difficulty.                            The patient tolerated the procedure well. Scope In: Scope Out: Findings:                 Esophagogastric landmarks were identified: the                            Z-line was found at 40 cm, the gastroesophageal                            junction was found at 40 cm and the upper extent of                            the gastric folds was found at 40 cm from the                            incisors.                           The exam of the esophagus was otherwise normal. No                            evidence of Barrett's esophagus                           A few small sessile polyps were found in the                            gastric body. Biopsies were taken with a cold                            forceps for histology.                           The exam of the stomach was otherwise normal.  The duodenal bulb and second portion of the                            duodenum were normal. Complications:            No immediate complications. Estimated blood loss:                            Minimal. Estimated Blood Loss:     Estimated blood loss was minimal. Impression:               - Esophagogastric landmarks identified.                           - Normal esophagus - no Barrett's                           - A few benign appearing gastric polyps. Biopsied.                           - Normal stomach otherwise                           - Normal duodenal bulb and second portion of the                            duodenum. Recommendation:           - Patient has a contact number available for                            emergencies. The signs and symptoms of potential                            delayed complications were discussed with the                            patient. Return to normal activities tomorrow.                            Written discharge instructions were provided to the                             patient.                           - Resume previous diet.                           - Continue present medications.                           - Resume Effient tomorrow (based on colonoscopy                            polypectomies)                           -  Await pathology results. Remo Lipps P. [name], MD 05/08/2019 8:46:00 AM This report has been signed electronically.

## 2019-05-08 NOTE — Progress Notes (Signed)
A and O x3. Report to RN. Tolerated MAC anesthesia well.Teeth unchanged after procedure.

## 2019-05-08 NOTE — Progress Notes (Signed)
Called to room to assist during endoscopic procedure.  Patient ID and intended procedure confirmed with present staff. Received instructions for my participation in the procedure from the performing physician.  

## 2019-05-08 NOTE — Patient Instructions (Addendum)
Propofol 400mg  given IV Diagnosis codes:  Screening Z12.11, GERD K21.9   YOU HAD AN ENDOSCOPIC PROCEDURE TODAY AT [facility]:   Refer to the procedure report that was given to you for any specific questions about what was found during the examination.  If the procedure report does not answer your questions, please call your gastroenterologist to clarify.  If you requested that your care partner not be given the details of your procedure findings, then the procedure report has been included in a sealed envelope for you to review at your convenience later.  YOU SHOULD EXPECT: Some feelings of bloating in the abdomen. Passage of more gas than usual.  Walking can help get rid of the air that was put into your GI tract during the procedure and reduce the bloating. If you had a lower endoscopy (such as a colonoscopy or flexible sigmoidoscopy) you may notice spotting of blood in your stool or on the toilet paper. If you underwent a bowel prep for your procedure, you may not have a normal bowel movement for a few days.  Please Note:  You might notice some irritation and congestion in your nose or some drainage.  This is from the oxygen used during your procedure.  There is no need for concern and it should clear up in a day or so.  SYMPTOMS TO REPORT IMMEDIATELY:   Following lower endoscopy (colonoscopy or flexible sigmoidoscopy):  Excessive amounts of blood in the stool  Significant tenderness or worsening of abdominal pains  Swelling of the abdomen that is new, acute  Fever of 100F or higher   Following upper endoscopy (EGD)  Vomiting of blood or coffee ground material  New chest pain or pain under the shoulder blades  Painful or persistently difficult swallowing  New shortness of breath  Fever of 100F or higher  Black, tarry-looking stools  For urgent or emergent issues, a gastroenterologist can be reached at any hour by calling [phone].   DIET:  We do recommend a  small meal at first, but then you may proceed to your regular diet.  Drink plenty of fluids but you should avoid alcoholic beverages for 24 hours.  MEDICATIONS: Continue present medications. Resume Effient tomorrow.  Please see handouts given to you by your recovery nurse.  ACTIVITY:  You should plan to take it easy for the rest of today and you should NOT DRIVE or use heavy machinery until tomorrow (because of the sedation medicines used during the test).    FOLLOW UP: Our staff will call the number listed on your records 48-72 hours following your procedure to check on you and address any questions or concerns that you may have regarding the information given to you following your procedure. If we do not reach you, we will leave a message.  We will attempt to reach you two times.  During this call, we will ask if you have developed any symptoms of COVID 19. If you develop any symptoms (ie: fever, flu-like symptoms, shortness of breath, cough etc.) before then, please call [phone].  If you test positive for Covid 19 in the 2 weeks post procedure, please call and report this information to [place].    If any biopsies were taken you will be contacted by phone or by letter within the next 1-3 weeks.  Please call us at [phone] if you have not heard about the biopsies in 3 weeks.   Thank you for allowing [place] to provide for your  healthcare needs today.   SIGNATURES/CONFIDENTIALITY: You and/or your care partner have signed paperwork which will be entered into your electronic medical record.  These signatures attest to the fact that that the information above on your After Visit Summary has been reviewed and is understood.  Full responsibility of the confidentiality of this discharge information lies with you and/or your care-partner.

## 2019-05-12 ENCOUNTER — Other Ambulatory Visit: Payer: Self-pay

## 2019-05-12 ENCOUNTER — Telehealth: Payer: Self-pay | Admitting: *Deleted

## 2019-05-12 NOTE — Telephone Encounter (Signed)
  Follow up Call-  Call back number 05/08/2019  Post procedure Call Back phone  # [phone]  Permission to leave phone message Yes  Some recent data might be hidden     Patient questions:  Do you have a fever, pain , or abdominal swelling? No. Pain Score  0 *  Have you tolerated food without any problems? Yes.    Have you been able to return to your normal activities? Yes.    Do you have any questions about your discharge instructions: Diet   No. Medications  No. Follow up visit  No.  Do you have questions or concerns about your Care? No.  Actions: * If pain score is 4 or above: No action needed, pain <4. 1. Have you developed a fever since your procedure? no  2.   Have you had an respiratory symptoms (SOB or cough) since your procedure? no  3.   Have you tested positive for COVID 19 since your procedure no  4.   Have you had any family members/close contacts diagnosed with the COVID 19 since your procedure?  no   If yes to any of these questions please route to Joylene John, RN and Alphonsa Gin, Therapist, sports.

## 2019-05-13 ENCOUNTER — Encounter: Payer: Self-pay | Admitting: Family Medicine

## 2019-05-13 ENCOUNTER — Ambulatory Visit (INDEPENDENT_AMBULATORY_CARE_PROVIDER_SITE_OTHER): Payer: Managed Care, Other (non HMO) | Admitting: Family Medicine

## 2019-05-13 VITALS — BP 138/87 | HR 60 | Temp 98.9°F | Ht 69.0 in | Wt 255.0 lb

## 2019-05-13 DIAGNOSIS — E1159 Type 2 diabetes mellitus with other circulatory complications: Secondary | ICD-10-CM | POA: Diagnosis not present

## 2019-05-13 DIAGNOSIS — E118 Type 2 diabetes mellitus with unspecified complications: Secondary | ICD-10-CM | POA: Diagnosis not present

## 2019-05-13 DIAGNOSIS — M545 Low back pain, unspecified: Secondary | ICD-10-CM

## 2019-05-13 DIAGNOSIS — Z23 Encounter for immunization: Secondary | ICD-10-CM | POA: Diagnosis not present

## 2019-05-13 DIAGNOSIS — F341 Dysthymic disorder: Secondary | ICD-10-CM

## 2019-05-13 DIAGNOSIS — E1169 Type 2 diabetes mellitus with other specified complication: Secondary | ICD-10-CM | POA: Diagnosis not present

## 2019-05-13 DIAGNOSIS — E785 Hyperlipidemia, unspecified: Secondary | ICD-10-CM

## 2019-05-13 DIAGNOSIS — I152 Hypertension secondary to endocrine disorders: Secondary | ICD-10-CM

## 2019-05-13 DIAGNOSIS — F419 Anxiety disorder, unspecified: Secondary | ICD-10-CM

## 2019-05-13 DIAGNOSIS — I10 Essential (primary) hypertension: Secondary | ICD-10-CM

## 2019-05-13 LAB — BAYER DCA HB A1C WAIVED: HB A1C (BAYER DCA - WAIVED): 6.2 % (ref <7.0)

## 2019-05-13 MED ORDER — JARDIANCE 25 MG PO TABS: 25.0000 mg | ORAL_TABLET | Freq: Every day | ORAL | 12 refills | Status: AC

## 2019-05-13 MED ORDER — GABAPENTIN 300 MG PO CAPS: ORAL_CAPSULE | ORAL | 3 refills | Status: AC

## 2019-05-13 MED ORDER — LANCET DEVICE MISC: 12 refills | Status: AC

## 2019-05-13 MED ORDER — MELOXICAM 15 MG PO TABS: 7.5000 mg | ORAL_TABLET | Freq: Every day | ORAL | 1 refills | Status: DC

## 2019-05-13 MED ORDER — CYCLOBENZAPRINE HCL 10 MG PO TABS: 10.0000 mg | ORAL_TABLET | Freq: Three times a day (TID) | ORAL | 3 refills | Status: AC | PRN

## 2019-05-13 MED ORDER — CITALOPRAM HYDROBROMIDE 40 MG PO TABS: 40.0000 mg | ORAL_TABLET | Freq: Every day | ORAL | 3 refills | Status: AC

## 2019-05-13 MED ORDER — GLUCOSE BLOOD VI STRP: ORAL_STRIP | 12 refills | Status: AC

## 2019-05-13 MED ORDER — PANTOPRAZOLE SODIUM 40 MG PO TBEC: 40.0000 mg | DELAYED_RELEASE_TABLET | Freq: Every day | ORAL | 3 refills | Status: AC

## 2019-05-13 MED ORDER — METOPROLOL TARTRATE 25 MG PO TABS: ORAL_TABLET | ORAL | 3 refills | Status: AC

## 2019-05-13 MED ORDER — FENOFIBRATE 54 MG PO TABS: 54.0000 mg | ORAL_TABLET | Freq: Every day | ORAL | 3 refills | Status: AC

## 2019-05-13 MED ORDER — OLMESARTAN MEDOXOMIL-HCTZ 20-12.5 MG PO TABS: 0.5000 | ORAL_TABLET | Freq: Every day | ORAL | 3 refills | Status: AC

## 2019-05-13 NOTE — Patient Instructions (Signed)
CAUTION taking the meloxicam given your heart history.  Nonsteroidal anti-inflammatory medications can increase risk of heart attack.  I would take the meloxicam very sparingly and only if needed.  Also please be mindful the meloxicam can interact with your Celexa.  These should be taken totally separately as they together increased risk for stomach bleeds.   Diabetes Mellitus and Standards of Medical Care Managing diabetes (diabetes mellitus) can be complicated. Your diabetes treatment may be managed by a team of health care providers, including:  A physician who specializes in diabetes (endocrinologist).  A nurse practitioner or physician assistant.  Nurses.  A diet and nutrition specialist (registered dietitian).  A certified diabetes educator (CDE).  An exercise specialist.  A pharmacist.  An eye doctor.  A foot specialist (podiatrist).  A dentist.  A primary care provider.  A mental health provider. Your health care providers follow guidelines to help you get the best quality of care. The following schedule is a general guideline for your diabetes management plan. Your health care providers may give you more specific instructions. Physical exams Upon being diagnosed with diabetes mellitus, and each year after that, your health care provider will ask about your medical and family history. He or she will also do a physical exam. Your exam may include:  Measuring your height, weight, and body mass index (BMI).  Checking your blood pressure. This will be done at every routine medical visit. Your target blood pressure may vary depending on your medical conditions, your age, and other factors.  Thyroid gland exam.  Skin exam.  Screening for damage to your nerves (peripheral neuropathy). This may include checking the pulse in your legs and feet and checking the level of sensation in your hands and feet.  A complete foot exam to inspect the structure and skin of your feet,  including checking for cuts, bruises, redness, blisters, sores, or other problems.  Screening for blood vessel (vascular) problems, which may include checking the pulse in your legs and feet and checking your temperature. Blood tests Depending on your treatment plan and your personal needs, you may have the following tests done:  HbA1c (hemoglobin A1c). This test provides information about blood sugar (glucose) control over the previous 2-3 months. It is used to adjust your treatment plan, if needed. This test will be done: ? At least 2 times a year, if you are meeting your treatment goals. ? 4 times a year, if you are not meeting your treatment goals or if treatment goals have changed.  Lipid testing, including total, LDL, and HDL cholesterol and triglyceride levels. ? The goal for LDL is less than 100 mg/dL (5.5 mmol/L). If you are at high risk for complications, the goal is less than 70 mg/dL (3.9 mmol/L). ? The goal for HDL is 40 mg/dL (2.2 mmol/L) or higher for men and 50 mg/dL (2.8 mmol/L) or higher for women. An HDL cholesterol of 60 mg/dL (3.3 mmol/L) or higher gives some protection against heart disease. ? The goal for triglycerides is less than 150 mg/dL (8.3 mmol/L).  Liver function tests.  Kidney function tests.  Thyroid function tests. Dental and eye exams  Visit your dentist two times a year.  If you have type 1 diabetes, your health care provider may recommend an eye exam 3-5 years after you are diagnosed, and then once a year after your first exam. ? For children with type 1 diabetes, a health care provider may recommend an eye exam when your child is age  10 or older and has had diabetes for 3-5 years. After the first exam, your child should get an eye exam once a year.  If you have type 2 diabetes, your health care provider may recommend an eye exam as soon as you are diagnosed, and then once a year after your first exam. Immunizations   The yearly flu (influenza)  vaccine is recommended for everyone 6 months or older who has diabetes.  The pneumonia (pneumococcal) vaccine is recommended for everyone 2 years or older who has diabetes. If you are 4 or older, you may get the pneumonia vaccine as a series of two separate shots.  The hepatitis B vaccine is recommended for adults shortly after being diagnosed with diabetes.  Adults and children with diabetes should receive all other vaccines according to age-specific recommendations from the Centers for Disease Control and Prevention (CDC). Mental and emotional health Screening for symptoms of eating disorders, anxiety, and depression is recommended at the time of diagnosis and afterward as needed. If your screening shows that you have symptoms (positive screening result), you may need more evaluation and you may work with a mental health care provider. Treatment plan Your treatment plan will be reviewed at every medical visit. You and your health care provider will discuss:  How you are taking your medicines, including insulin.  Any side effects you are experiencing.  Your blood glucose target goals.  The frequency of your blood glucose monitoring.  Lifestyle habits, such as activity level as well as tobacco, alcohol, and substance use. Diabetes self-management education Your health care provider will assess how well you are monitoring your blood glucose levels and whether you are taking your insulin correctly. He or she may refer you to:  A certified diabetes educator to manage your diabetes throughout your life, starting at diagnosis.  A registered dietitian who can create or review your personal nutrition plan.  An exercise specialist who can discuss your activity level and exercise plan. Summary  Managing diabetes (diabetes mellitus) can be complicated. Your diabetes treatment may be managed by a team of health care providers.  Your health care providers follow guidelines in order to help you  get the best quality of care.  Standards of care including having regular physical exams, blood tests, blood pressure monitoring, immunizations, screening tests, and education about how to manage your diabetes.  Your health care providers may also give you more specific instructions based on your individual health. This information is not intended to replace advice given to you by your health care provider. Make sure you discuss any questions you have with your health care provider. Document Released: 06/03/2009 Document Revised: 04/25/2018 Document Reviewed: 05/04/2016 Elsevier Patient Education  2020 [employer].

## 2019-05-13 NOTE — Progress Notes (Signed)
Subjective: CC: DM2, GAD PCP: Janora Norlander, DO VFI:EPPIRJ David Black is a 58 y.o. male presenting to clinic today for:  1. Type 2 Diabetes w/ HTN, HLD:  Patient reports: Glucometer: Contour. Taking medication(s): Jardiance '25mg'$ , Lopressor 25 mg twice daily and half a tablet of Benicar 20-12.5 mg daily.  Sugar has fluctuated up a little but high has been 140.  Last eye exam: Had with Dr. Truman Hayward at [facility] a month ago Last foot exam: Needs Last A1c:  Lab Results  Component Value Date   HGBA1C 5.7 10/13/2018   Nephropathy screen indicated?: on ARB Last flu, zoster and/or pneumovax:  Immunization History  Administered Date(s) Administered  . Influenza Whole 05/20/2010  . Influenza,inj,Quad PF,6+ Mos 06/09/2013, 05/17/2014, 06/01/2015, 05/21/2016, 06/05/2017, 05/30/2018  . Td 05/19/2013  . Tdap 01/25/2006    ROS: Denies dizziness, LOC, polyuria, polydipsia, unintended weight loss/gain, foot ulcerations, numbness or tingling in extremities, shortness of breath or chest pain.  He has neuropathy which gabapentin helps with.  2.  Anxiety/depression Patient reports that anxiety and depression are under good control with the increased dose of Celexa.  Additionally, he is quit the job that was causing him quite a bit of stress and currently interviewing for multiple other positions.  Overall he is doing well and has no concerns at this time.    ROS: Per HPI  Allergies  Allergen Reactions  . Advicor [Niacin-Lovastatin Er] Other (See Comments)    unknown  . Altace [Ramipril] Other (See Comments)    cough  . Atorvastatin Other (See Comments)    Muscle aches   . Bextra [Valdecoxib] Swelling  . Bupropion Hcl Other (See Comments)    Unknown  . Janumet [Sitagliptin-Metformin Hcl] Other (See Comments)    unknown  . Niacin Other (See Comments)    unknown  . Rofecoxib Swelling  . Sulfamethoxazole-Trimethoprim Other (See Comments)    unknown  . Sulfonamide Derivatives Other (See  Comments)    unknown  . Zocor [Simvastatin] Other (See Comments)    Muscle aches    Past Medical History:  Diagnosis Date  . CAD (coronary artery disease)    Minimal plaque on catheterization Nov 2011; EF normal by history  . Chest discomfort 07/18/2010   With exertion  . Depression with anxiety   . Diabetes mellitus without complication ([place])   . GERD (gastroesophageal reflux disease)   . Heart murmur   . Hyperlipidemia   . Hypertension    x 10 years  . Myocardial infarction ([place]) 07/2018  . Nocturnal hypoxia 09/14/2013  . Obesity, unspecified   . Smoker    x 20 years, 1 1/2 ppd, quit 7 years ago  . SOB (shortness of breath) 07/18/2010    Current Outpatient Medications:  .  aspirin 81 MG tablet, Take 81 mg by mouth daily.  , Disp: , Rfl:  .  BAYER CONTOUR NEXT TEST test strip, Use as instructed, Disp: 100 each, Rfl: 2 .  citalopram (CELEXA) 40 MG tablet, Take 1 tablet (40 mg total) by mouth daily., Disp: 90 tablet, Rfl: 0 .  cyclobenzaprine (FLEXERIL) 10 MG tablet, Take 1 tablet (10 mg total) by mouth 3 (three) times daily as needed for muscle spasms. (Patient taking differently: Take 10 mg by mouth as needed for muscle spasms. ), Disp: 30 tablet, Rfl: 3 .  fenofibrate 54 MG tablet, Take 1 tablet (54 mg total) by mouth daily., Disp: 90 tablet, Rfl: 3 .  gabapentin (NEURONTIN) 300 MG capsule, TAKE (1)  CAPSULE THREE TIMES DAILY. (Patient taking differently: Take 300 mg by mouth 3 (three) times daily. TAKE (1) CAPSULE THREE TIMES DAILY.), Disp: 270 capsule, Rfl: 3 .  JARDIANCE 25 MG TABS tablet, TAKE 1 TABLET DAILY, Disp: 30 tablet, Rfl: 2 .  meloxicam (MOBIC) 15 MG tablet, Take 7.5 mg by mouth daily. Whole tablet alternating with half tablet, Disp: , Rfl:  .  metoprolol tartrate (LOPRESSOR) 25 MG tablet, Take 1/2 tablet (12.5 mg total) by mouth 2 (two) times daily., Disp: 90 tablet, Rfl: 0 .  nitroGLYCERIN (NITROSTAT) 0.4 MG SL tablet, Place 1 tablet (0.4 mg total) under the tongue  every 5 (five) minutes as needed for chest pain., Disp: 30 tablet, Rfl: 2 .  olmesartan-hydrochlorothiazide (BENICAR HCT) 20-12.5 MG tablet, Take 1 tablet by mouth daily. (Patient taking differently: Take 0.5 tablets by mouth daily. ), Disp: 90 tablet, Rfl: 3 .  ONETOUCH DELICA LANCETS 16X MISC, CHECK BLOOD SUGAR 2 TIMES A DAY, Disp: 100 each, Rfl: 1 .  pantoprazole (PROTONIX) 40 MG tablet, Take 1 tablet (40 mg total) by mouth daily., Disp: 90 tablet, Rfl: 3 .  prasugrel (EFFIENT) 10 MG TABS tablet, Take 1 tablet (10 mg total) by mouth daily., Disp: 90 tablet, Rfl: 1  Current Facility-Administered Medications:  .  0.9 %  sodium chloride infusion, 500 mL, Intravenous, Once, Armbruster, Carlota Raspberry, MD Social History   Socioeconomic History  . Marital status: Married    Spouse name: Not on file  . Number of children: Not on file  . Years of education: Not on file  . Highest education level: Not on file  Occupational History  . Not on file  Social Needs  . Financial resource strain: Not on file  . Food insecurity    Worry: Not on file    Inability: Not on file  . Transportation needs    Medical: Not on file    Non-medical: Not on file  Tobacco Use  . Smoking status: Former Smoker    Packs/day: 1.50    Years: 20.00    Pack years: 30.00    Types: Cigarettes    Quit date: 08/20/2002    Years since quitting: 16.7  . Smokeless tobacco: Never Used  Substance and Sexual Activity  . Alcohol use: No    Alcohol/week: 0.0 standard drinks  . Drug use: No  . Sexual activity: Not on file  Lifestyle  . Physical activity    Days per week: Not on file    Minutes per session: Not on file  . Stress: Not on file  Relationships  . Social Herbalist on phone: Not on file    Gets together: Not on file    Attends religious service: Not on file    Active member of club or organization: Not on file    Attends meetings of clubs or organizations: Not on file    Relationship status: Not on  file  . Intimate partner violence    Fear of current or ex partner: Not on file    Emotionally abused: Not on file    Physically abused: Not on file    Forced sexual activity: Not on file  Other Topics Concern  . Not on file  Social History Narrative   Married with 1 daughter   Family History  Problem Relation Age of Onset  . Heart disease Father   . Hyperlipidemia Father   . Hypertension Father   . Kidney disease Father   .  Testicular cancer Paternal Uncle   . Heart disease Paternal Uncle   . Colon cancer Paternal Uncle   . Diabetes Mother   . Hyperlipidemia Mother   . Hypertension Mother   . Uterine cancer Mother 52  . Breast cancer Paternal Aunt   . Coronary artery disease Other   . Esophageal cancer Neg Hx   . Stomach cancer Neg Hx   . Rectal cancer Neg Hx   . Colon polyps Neg Hx     Objective: Office vital signs reviewed. BP 138/87   Pulse 60   Temp 98.9 F (37.2 C) (Oral)   Ht '5\' 9"'$  (1.753 m)   Wt 255 lb (115.7 kg)   SpO2 96%   BMI 37.66 kg/m   Physical Examination:  General: Awake, alert, well nourished, No acute distress HEENT: Normal, sclera white, MMM Cardio: regular rate and rhythm, S1S2 heard, no murmurs appreciated Pulm: clear to auscultation bilaterally, no wheezes, rhonchi or rales; normal work of breathing on room air Extremities: warm, well perfused, No edema, cyanosis or clubbing; +2 pulses bilaterally Neuro: see DM foot Psych: mood stable, speech normal, pleasant.  Depression screen [facility] 2/9 05/13/2019 12/24/2018 10/13/2018  Decreased Interest 0 0 0  Down, Depressed, Hopeless 0 0 0  PHQ - 2 Score 0 0 0  Altered sleeping 0 - 0  Tired, decreased energy 0 - 0  Change in appetite 0 - 0  Feeling bad or failure about yourself  0 - 0  Trouble concentrating 0 - 0  Moving slowly or fidgety/restless 0 - 0  Suicidal thoughts - - 0  PHQ-9 Score 0 - 0  Difficult doing work/chores - - -   GAD 7 : Generalized Anxiety Score 05/13/2019 12/24/2018 10/13/2018  08/28/2018  Nervous, Anxious, on Edge 0 '1 1 2  '$ Control/stop worrying 0 0 0 2  Worry too much - different things 0 '1 1 2  '$ Trouble relaxing 0 '1 1 1  '$ Restless 0 0 0 1  Easily annoyed or irritable 0 1 0 1  Afraid - awful might happen 0 0 0 0  Total GAD 7 Score 0 '4 3 9   '$ Diabetic Foot Exam - Simple   Simple Foot Form Diabetic Foot exam was performed with the following findings: Yes 05/13/2019  8:36 AM  Visual Inspection No deformities, no ulcerations, no other skin breakdown bilaterally: Yes Sensation Testing Intact to touch and monofilament testing bilaterally: Yes Pulse Check Posterior Tibialis and Dorsalis pulse intact bilaterally: Yes Comments Excellent vibratory sensation.     Assessment/ Plan: 58 y.o. male   1. Type 2 diabetes mellitus with complication, without long-term current use of insulin (HCC) Under excellent control with A1c of 6.2 today.  Continue current regimen.  Refills have been sent.  May follow-up in 6 months.  He declined pneumococcal vaccination.  I counseled him on this.  Influenza vaccination administered.  We will obtain his eye exam from his optometrist - Bayer DCA Hb A1c Waived - glucose blood test strip; Test blood sugar daily.  Dx E11.9  Dispense: 100 each; Refill: 12 - empagliflozin (JARDIANCE) 25 MG TABS tablet; Take 25 mg by mouth daily.  Dispense: 30 tablet; Refill: 12 - Lancet Device MISC; Test blood sugar once daily E11.9 (uses contour)  Dispense: 100 each; Refill: 12 - gabapentin (NEURONTIN) 300 MG capsule; TAKE (1) CAPSULE THREE TIMES DAILY.  Dispense: 270 capsule; Refill: 3  2. Hypertension associated with diabetes ([place]) Controlled.  Continue current regimen.  I cautioned risk of  cardiovascular events given intermittent use of meloxicam. - CMP14+EGFR - olmesartan-hydrochlorothiazide (BENICAR HCT) 20-12.5 MG tablet; Take 0.5 tablets by mouth daily.  Dispense: 45 tablet; Refill: 3 - metoprolol tartrate (LOPRESSOR) 25 MG tablet; Take 1/2 tablet (12.5  mg total) by mouth 2 (two) times daily.  Dispense: 90 tablet; Refill: 3  3. Hyperlipidemia associated with type 2 diabetes mellitus (HCC) Check lipid panel.  Continue fenofibrate - Lipid Panel - CMP14+EGFR - TSH - fenofibrate 54 MG tablet; Take 1 tablet (54 mg total) by mouth daily.  Dispense: 90 tablet; Refill: 3  4. Morbid obesity (HCC) - TSH  5. Anxiety Controlled.  Continue current regimen - TSH  6. DEPRESSION/ANXIETY - citalopram (CELEXA) 40 MG tablet; Take 1 tablet (40 mg total) by mouth daily.  Dispense: 90 tablet; Refill: 3  7. Left-sided low back pain without sciatica Controlled with intermittent use of Flexeril and meloxicam - cyclobenzaprine (FLEXERIL) 10 MG tablet; Take 1 tablet (10 mg total) by mouth 3 (three) times daily as needed for muscle spasms.  Dispense: 30 tablet; Refill: 3   Orders Placed This Encounter  Procedures  . Bayer DCA Hb A1c Waived  . Lipid Panel  . CMP14+EGFR  . TSH   Meds ordered this encounter  Medications  . citalopram (CELEXA) 40 MG tablet    Sig: Take 1 tablet (40 mg total) by mouth daily.    Dispense:  90 tablet    Refill:  3  . glucose blood test strip    Sig: Test blood sugar daily.  Dx E11.9    Dispense:  100 each    Refill:  12  . empagliflozin (JARDIANCE) 25 MG TABS tablet    Sig: Take 25 mg by mouth daily.    Dispense:  30 tablet    Refill:  12  . pantoprazole (PROTONIX) 40 MG tablet    Sig: Take 1 tablet (40 mg total) by mouth daily.    Dispense:  90 tablet    Refill:  3  . Lancet Device MISC    Sig: Test blood sugar once daily E11.9 (uses contour)    Dispense:  100 each    Refill:  12  . olmesartan-hydrochlorothiazide (BENICAR HCT) 20-12.5 MG tablet    Sig: Take 0.5 tablets by mouth daily.    Dispense:  45 tablet    Refill:  3    $  . metoprolol tartrate (LOPRESSOR) 25 MG tablet    Sig: Take 1/2 tablet (12.5 mg total) by mouth 2 (two) times daily.    Dispense:  90 tablet    Refill:  3  . meloxicam (MOBIC) 15  MG tablet    Sig: Take 0.5 tablets (7.5 mg total) by mouth daily. Whole tablet alternating with half tablet    Dispense:  90 tablet    Refill:  1  . gabapentin (NEURONTIN) 300 MG capsule    Sig: TAKE (1) CAPSULE THREE TIMES DAILY.    Dispense:  270 capsule    Refill:  3  . fenofibrate 54 MG tablet    Sig: Take 1 tablet (54 mg total) by mouth daily.    Dispense:  90 tablet    Refill:  3  . cyclobenzaprine (FLEXERIL) 10 MG tablet    Sig: Take 1 tablet (10 mg total) by mouth 3 (three) times daily as needed for muscle spasms.    Dispense:  30 tablet    Refill:  [place], DO [facility] (  [phone]

## 2019-05-14 LAB — CMP14+EGFR
ALT: 22 IU/L (ref 0–44)
AST: 20 IU/L (ref 0–40)
Albumin/Globulin Ratio: 1.5 (ref 1.2–2.2)
Albumin: 4.4 g/dL (ref 3.8–4.9)
Alkaline Phosphatase: 40 IU/L (ref 39–117)
BUN/Creatinine Ratio: 15 (ref 9–20)
BUN: 19 mg/dL (ref 6–24)
Bilirubin Total: 0.3 mg/dL (ref 0.0–1.2)
CO2: 21 mmol/L (ref 20–29)
Calcium: 9.7 mg/dL (ref 8.7–10.2)
Chloride: 103 mmol/L (ref 96–106)
Creatinine, Ser: 1.31 mg/dL — ABNORMAL HIGH (ref 0.76–1.27)
GFR calc Af Amer: 70 mL/min/{1.73_m2} (ref >59)
GFR calc non Af Amer: 60 mL/min/{1.73_m2} (ref >59)
Globulin, Total: 2.9 g/dL (ref 1.5–4.5)
Glucose: 122 mg/dL — ABNORMAL HIGH (ref 65–99)
Potassium: 4.5 mmol/L (ref 3.5–5.2)
Sodium: 139 mmol/L (ref 134–144)
Total Protein: 7.3 g/dL (ref 6.0–8.5)

## 2019-05-14 LAB — LIPID PANEL
Chol/HDL Ratio: 5.6 ratio — ABNORMAL HIGH (ref 0.0–5.0)
Cholesterol, Total: 186 mg/dL (ref 100–199)
HDL: 33 mg/dL — ABNORMAL LOW (ref >39)
LDL Chol Calc (NIH): 123 mg/dL — ABNORMAL HIGH (ref 0–99)
Triglycerides: 166 mg/dL — ABNORMAL HIGH (ref 0–149)
VLDL Cholesterol Cal: 30 mg/dL (ref 5–40)

## 2019-05-14 LAB — TSH: TSH: 1.47 u[IU]/mL (ref 0.450–4.500)

## 2019-05-15 ENCOUNTER — Encounter: Payer: Self-pay | Admitting: Family Medicine

## 2019-05-15 DIAGNOSIS — E1122 Type 2 diabetes mellitus with diabetic chronic kidney disease: Secondary | ICD-10-CM | POA: Insufficient documentation

## 2019-05-19 ENCOUNTER — Encounter: Payer: Managed Care, Other (non HMO) | Admitting: Family Medicine

## 2019-09-09 ENCOUNTER — Other Ambulatory Visit: Payer: Self-pay

## 2019-09-17 ENCOUNTER — Ambulatory Visit: Payer: Managed Care, Other (non HMO) | Admitting: Family Medicine

## 2019-10-08 ENCOUNTER — Other Ambulatory Visit: Payer: Self-pay | Admitting: Family Medicine

## 2019-11-09 ENCOUNTER — Other Ambulatory Visit: Payer: Self-pay | Admitting: Family Medicine

## 2020-02-29 ENCOUNTER — Telehealth: Payer: Self-pay | Admitting: Cardiology

## 2020-02-29 NOTE — Telephone Encounter (Signed)
New message   Mr. Sullenger has been seening Dr. Agustin Cree in [facility]. He now works in [place] and would like to switch providers to Dr. Ellyn Hack. He states Dr. Ellyn Hack did his stents and would like to follow up with him.   Is this switch approved?

## 2020-03-02 NOTE — Telephone Encounter (Signed)
Fine with me

## 2020-03-03 NOTE — Telephone Encounter (Signed)
OK ° °DH °

## 2020-04-04 ENCOUNTER — Ambulatory Visit: Payer: 59 | Admitting: Cardiology

## 2020-04-04 ENCOUNTER — Other Ambulatory Visit: Payer: Self-pay

## 2020-04-04 ENCOUNTER — Encounter: Payer: Self-pay | Admitting: Cardiology

## 2020-04-04 VITALS — BP 125/84 | HR 65 | Ht 69.0 in | Wt 265.4 lb

## 2020-04-04 DIAGNOSIS — E118 Type 2 diabetes mellitus with unspecified complications: Secondary | ICD-10-CM

## 2020-04-04 DIAGNOSIS — E785 Hyperlipidemia, unspecified: Secondary | ICD-10-CM

## 2020-04-04 DIAGNOSIS — I25119 Atherosclerotic heart disease of native coronary artery with unspecified angina pectoris: Secondary | ICD-10-CM | POA: Diagnosis not present

## 2020-04-04 DIAGNOSIS — E1169 Type 2 diabetes mellitus with other specified complication: Secondary | ICD-10-CM | POA: Diagnosis not present

## 2020-04-04 DIAGNOSIS — Z9861 Coronary angioplasty status: Secondary | ICD-10-CM

## 2020-04-04 DIAGNOSIS — Z789 Other specified health status: Secondary | ICD-10-CM

## 2020-04-04 DIAGNOSIS — I2510 Atherosclerotic heart disease of native coronary artery without angina pectoris: Secondary | ICD-10-CM | POA: Diagnosis not present

## 2020-04-04 DIAGNOSIS — I10 Essential (primary) hypertension: Secondary | ICD-10-CM

## 2020-04-04 DIAGNOSIS — I209 Angina pectoris, unspecified: Secondary | ICD-10-CM

## 2020-04-04 MED ORDER — NEXLETOL 180 MG PO TABS: 180.0000 mg | ORAL_TABLET | Freq: Every day | ORAL | 2 refills | Status: DC

## 2020-04-04 NOTE — Patient Instructions (Addendum)
Medication Instructions:  Start Nexletol 180 mg daily    *If you need a refill on your cardiac medications before your next appointment, please call your pharmacy*   Lab Work: LIPID in 3 months.   If you have labs (blood work) drawn today and your tests are completely normal, you will receive your results only by: Marland Kitchen MyChart Message (if you have MyChart) OR . A paper copy in the mail If you have any lab test that is abnormal or we need to change your treatment, we will call you to review the results.   Follow-Up: At [facility], you and your health needs are our priority.  As part of our continuing mission to provide you with exceptional heart care, we have created designated Provider Care Teams.  These Care Teams include your primary Cardiologist (physician) and Advanced Practice Providers (APPs -  Physician Assistants and Nurse Practitioners) who all work together to provide you with the care you need, when you need it.  We recommend signing up for the patient portal called "MyChart".  Sign up information is provided on this After Visit Summary.  MyChart is used to connect with patients for Virtual Visits (Telemedicine).  Patients are able to view lab/test results, encounter notes, upcoming appointments, etc.  Non-urgent messages can be sent to your provider as well.   To learn more about what you can do with MyChart, go to [email or web address].    Your next appointment:   12 month(s)  The format for your next appointment:   In Person  Provider:   Glenetta Hew, MD

## 2020-04-04 NOTE — Progress Notes (Signed)
Primary Care Provider: Jettie Booze, NP -> recently seen at [facility]- Cardiologist: Glenetta Hew, MD - transferred care to [facility] Electrophysiologist: None  Clinic Note: Chief Complaint  Patient presents with  . Follow-up    Transferring care to [facility]  . Coronary Artery Disease    Two-vessel PCI June 2019   HPI:    David Black is a 59 y.o. male with a PMH notable for CAD (PCI to LCx and dRCA in December 2019) who presents today for annual follow-up but first follow-up transitioning care to [facility] office..   Cath-PCI 07/22/2018: (Progressive angina) lesion #1 - pLCx 75%-p-mLCx 100% subacute location => DES PCI: (Xience Sierra 3.5 mm 23 mm -- > 4.1 mm) --> OM2 100% PTCA with balloon  ->100% occluded distally -distal thrombo-embolization); lesion #2: dRCA 95% => DES PCI: Xience CR 3.0 mmx 18 mm-3.6 mm.   He indicated that his anginal equivalent was chest pain/pressure with significant nausea and diaphoresis.Marland Kitchen  Apparently he had an episode about a month before he came in for cardiology evaluation that lasted about an hour and a half.  Since that time he had 7 episodes until he finally went to see his PCP and was referred for cardiology evaluation--Cath Lab.  In addition to CAD, he has hypertension and hyperlipidemia-intolerant to multiple statins.  David Black was last seen on April 06, 2019 by Dr. Agustin Cree -> was doing well.  No symptoms of chest tightness pressure or squeezing.  Recent Hospitalizations: None  Reviewed  CV studies:    The following studies were reviewed today: (if available, images/films reviewed: From Epic Chart or Care Everywhere) . Cath and PCI 07/22/2018:  Severe two-vessel disease with 100% subacute occlusion of the proximal circumflex and 95% stenosis of the distal RCA.  Successful DES PCI of the occluded Circumflex with a Xience Sierra DES 3.5 mm x 23 mm (4.1 mm).  Successful DES PCI  of distal RCA with Xience Sierra DES 3.0 mm 18 mm (3.6 mm)  Apparently preserved LVEF with normal LVEDP. ->  EF 55-60%  2D Echo 07/22/2018: Normal LV size and function.  EF 55 to 60%.  GR 1 DD.  Mild RV enlargement.  Otherwise normal valves.   Interval History:   David Black for essentially annual follow-up stating he is doing quite well.  He acknowledges that he is put on some weight and is a little deconditioned.  He is a little bit embarrassed with his weight.  He is active, but has been bothered some by arthritis pains.  He has not had any of the chest tightness or squeezing/burning sensation.  No pressure or dyspnea with rest or exertion.  No heart failure symptoms or arrhythmia symptoms.  CV Review of Symptoms (Summary) Cardiovascular ROS: no chest pain or dyspnea on exertion positive for - Weight gain with some associated deconditioning related exertional fatigue and dyspnea. negative for - edema, irregular heartbeat, orthopnea, palpitations, paroxysmal nocturnal dyspnea, rapid heart rate, shortness of breath or Syncope/near syncope, TIA/amaurosis fugax, claudication  The patient does not have symptoms concerning for COVID-19 infection (fever, chills, cough, or new shortness of breath).  The patient is practicing social distancing & Masking.   + Moderna COVID-19 vaccine in March and April 2021  REVIEWED OF SYSTEMS   Review of Systems  Constitutional: Negative for chills, fever, malaise/fatigue and weight loss (Actually weight gain).  HENT: Negative for nosebleeds.   Respiratory: Positive for shortness of breath (A little  bit because of deconditioning). Negative for cough.   Gastrointestinal: Negative for blood in stool and melena.  Genitourinary: Negative for hematuria.  Musculoskeletal: Negative for falls.  Neurological: Negative for dizziness, tingling and headaches.  Psychiatric/Behavioral: Negative for depression. The patient is not nervous/anxious.    I have reviewed  and (if needed) personally updated the patient's problem list, medications, allergies, past medical and surgical history, social and family history.   PAST MEDICAL HISTORY   Past Medical History:  Diagnosis Date  . CAD S/P 2 Vessel DES PCI (p-mLCx, dPCA) 07/22/2018   Cath and PCI 07/22/2018: Severe two-vessel disease with 100% subacute occlusion of the proximal circumflex and 95% stenosis of the distal RCA. Successful DES PCI of the occluded Circumflex with a Xience Sierra DES 3.5 mm x 23 mm (4.1 mm). Successful DES PCI of distal RCA with Xience Sierra DES 3.0 mm 18 mm (3.6 mm) Apparently preserved LVEF with normal LVEDP. ->  EF 55-60%  . Depression with anxiety   . Diabetes mellitus with complication in adult patient [facility])    On Jardiance  . GERD (gastroesophageal reflux disease)   . Heart murmur    Nothing seen on echo  . Hyperlipidemia    Has tried lovastatin, simvastatin, atorvastatin and rosuvastatin.  Unable to tolerate.  --> On fenofibrate and omega-3 fatty acid  . Hypertension    x 10 years  . Nocturnal hypoxia 09/14/2013  . Obesity, unspecified   . Smoker    x 20 years, 1 1/2 ppd, quit 7 years ago  . Unstable angina - Likely Subacute Inferior STEMI 07/2018   Cath: 75% pLCx -> Subacute 100% mLCx (DES PCI); & and 95% dRCA (DES PCI)    PAST SURGICAL HISTORY   Past Surgical History:  Procedure Laterality Date  . CHOLECYSTECTOMY    . COLONOSCOPY  2006  . CORONARY STENT INTERVENTION N/A 07/22/2018   Procedure: CORONARY STENT INTERVENTION;  Surgeon: Leonie Man, MD;  Location: [place] [facility];  Service: Cardiovascular;; DES PCI of the occluded Circumflex with a Xience Sierra DES 3.5 mm x 23 mm (4.1 mm).; DES PCI of distal RCA with Xience Sierra DES 3.0 mm 18 mm (3.6 mm)  . FINGER SURGERY     right hand,ring finger  . HERNIA REPAIR    . LEFT HEART CATH AND CORONARY ANGIOGRAPHY N/A 07/22/2018   Procedure: LEFT HEART CATH AND CORONARY ANGIOGRAPHY;  Surgeon: Leonie Man,  MD;  Location: [place] [facility];  Service: Cardiovascular; (progressive angina) Severe two-vessel disease with 100% subacute occlusion of the proximal circumflex and 95% stenosis of the distal RCA.;  Normal LVEF/EDP.  Marland Kitchen TRANSTHORACIC ECHOCARDIOGRAM  07/22/2018   02/20/2023 normal LV size and function.  EF 55 to 60%.  GR 1 DD.  Mild RV enlargement.  Otherwise normal valves.  Marland Kitchen UPPER GASTROINTESTINAL ENDOSCOPY  2006  . VENTRAL HERNIA REPAIR  06/2010   x3   Diagnostic 07/22/2018  Intervention    MEDICATIONS/ALLERGIES   Current Meds  Medication Sig  . Ascorbic Acid (VITAMIN C PO) Take by mouth.  Marland Kitchen aspirin 81 MG tablet Take 81 mg by mouth daily.    . citalopram (CELEXA) 40 MG tablet Take 1 tablet (40 mg total) by mouth daily. (Patient taking differently: Take 20 mg by mouth daily. )  . cyclobenzaprine (FLEXERIL) 10 MG tablet Take 1 tablet (10 mg total) by mouth 3 (three) times daily as needed for muscle spasms.  . empagliflozin (JARDIANCE) 25 MG TABS tablet Take  25 mg by mouth daily.  . fenofibrate 54 MG tablet Take 1 tablet (54 mg total) by mouth daily.  Marland Kitchen gabapentin (NEURONTIN) 300 MG capsule TAKE (1) CAPSULE THREE TIMES DAILY.  Marland Kitchen glucose blood test strip Test blood sugar daily.  Dx E11.9  . Lancet Device MISC Test blood sugar once daily E11.9 (uses contour)  . meloxicam (MOBIC) 15 MG tablet ALTERNATE TAKING 1/2 TO 1 TABLET DAILY AS DIRECTED  . metoprolol tartrate (LOPRESSOR) 25 MG tablet Take 1/2 tablet (12.5 mg total) by mouth 2 (two) times daily.  . Multiple Vitamins-Minerals (MULTIVITAMIN MEN PO) Take by mouth.  . olmesartan-hydrochlorothiazide (BENICAR HCT) 20-12.5 MG tablet Take 0.5 tablets by mouth daily.  . Omega-3 300 MG CAPS Take by mouth.  . pantoprazole (PROTONIX) 40 MG tablet Take 1 tablet (40 mg total) by mouth daily.  . [DISCONTINUED] prasugrel (EFFIENT) 10 MG TABS tablet Take 1 tablet (10 mg total) by mouth daily.   Current Facility-Administered Medications for the  04/04/20 encounter (Office Visit) with Leonie Man, MD  Medication  . 0.9 %  sodium chloride infusion    Allergies  Allergen Reactions  . Advicor [Niacin-Lovastatin Er] Other (See Comments)    unknown  . Altace [Ramipril] Other (See Comments)    cough  . Atorvastatin Other (See Comments)    Muscle aches   . Bextra [Valdecoxib] Swelling  . Bupropion Hcl Other (See Comments)    Unknown  . Janumet [Sitagliptin-Metformin Hcl] Other (See Comments)    unknown  . Niacin Other (See Comments)    unknown  . Rofecoxib Swelling  . Sulfamethoxazole-Trimethoprim Other (See Comments)    unknown  . Sulfonamide Derivatives Other (See Comments)    unknown  . Zocor [Simvastatin] Other (See Comments)    Muscle aches     SOCIAL HISTORY/FAMILY HISTORY   Social History   Tobacco Use  . Smoking status: Former Smoker    Packs/day: 1.50    Years: 20.00    Pack years: 30.00    Types: Cigarettes    Quit date: 08/20/2002    Years since quitting: 17.6  . Smokeless tobacco: Never Used  Vaping Use  . Vaping Use: Never used  Substance Use Topics  . Alcohol use: No    Alcohol/week: 0.0 standard drinks  . Drug use: No   Social History   Social History Narrative   Married with 1 daughter;      Dorie Rank to [place] and is trying to transfer his care here   family history includes Breast cancer in his paternal aunt; Colon cancer in his paternal uncle; Coronary artery disease in an other family member; Diabetes in his mother; Heart disease in his father and paternal uncle; Hyperlipidemia in his father and mother; Hypertension in his father and mother; Kidney disease in his father; Testicular cancer in his paternal uncle; Uterine cancer (age of onset: 26) in his mother. . Heart disease Mother  . Heart disease Father  . Diabetes Father  . Hyperlipidemia Father    OBJCTIVE -PE, EKG, labs   Wt Readings from Last 3 Encounters:  04/04/20 265 lb 6.4 oz (120.4 kg)  05/13/19 255 lb (115.7 kg)   05/08/19 250 lb (113.4 kg)    Physical Exam: BP 125/84   Pulse 65   Ht 5\' 9"  (1.753 m)   Wt 265 lb 6.4 oz (120.4 kg)   BMI 39.19 kg/m  Physical Exam Constitutional:      Appearance: Normal appearance. He is normal weight.  Comments: ~Morbidly obese, well-groomed  HENT:     Head: Normocephalic and atraumatic.  Neck:     Vascular: No carotid bruit, hepatojugular reflux or JVD.  Cardiovascular:     Rate and Rhythm: Normal rate and regular rhythm.  No extrasystoles are present.    Chest Wall: PMI is not displaced.     Pulses: Normal pulses and intact distal pulses.     Heart sounds: S1 normal and S2 normal. Heart sounds are distant. No murmur heard.  No friction rub. No gallop.   Pulmonary:     Effort: Pulmonary effort is normal. No respiratory distress.     Breath sounds: Normal breath sounds.  Abdominal:     General: Abdomen is flat. Bowel sounds are normal.     Palpations: Abdomen is soft. There is no mass (No HSM).     Comments: Truncal obesity  Musculoskeletal:        General: No swelling (Trivial). Normal range of motion.     Cervical back: Normal range of motion.  Neurological:     General: No focal deficit present.     Mental Status: He is alert and oriented to person, place, and time.  Psychiatric:        Mood and Affect: Mood normal.        Behavior: Behavior normal.        Thought Content: Thought content normal.        Judgment: Judgment normal.      Adult ECG Report  Rate: 62;  Rhythm: normal sinus rhythm and Inferior MI, age undetermined.  Otherwise normal axis, intervals and durations.;   Narrative Interpretation: Relatively stable EKG.  Recent Labs:    Ref Range & Units  12/22/2019  Cholesterol, Total 100 - 199 mg/dL 165   Triglycerides 0 - 149 mg/dL 204High   HDL >39 mg/dL 28Low   VLDL Cholesterol Cal 5 - 40 mg/dL 36   LDL 0 - 99 mg/dL 101High   LDL/HDL Ratio 0.0 - 3.6 ratio 3.6     Lab Results  Component Value Date   CHOL 186  05/13/2019   HDL 33 (L) 05/13/2019   LDLCALC 123 (H) 05/13/2019   TRIG 166 (H) 05/13/2019   CHOLHDL 5.6 (H) 05/13/2019   Lab Results  Component Value Date   CREATININE 1.31 (H) 05/13/2019   BUN 19 05/13/2019   NA 139 05/13/2019   K 4.5 05/13/2019   CL 103 05/13/2019   CO2 21 05/13/2019   Lab Results  Component Value Date   TSH 1.470 05/13/2019    ASSESSMENT/PLAN    Problem List Items Addressed This Visit    Coronary artery disease involving native coronary artery of native heart with angina pectoris (HCC) - Primary (Chronic)    Two-vessel disease.  He is on low-dose beta-blocker plus ARB and aspirin.  He is not on statin because of intolerance, and no longer on Plavix.  Thankfully, he is not having any further anginal symptoms.  Plan: Continue current low-dose beta-blocker given his relatively normal blood pressures. We will add Nexletol for additional lipid management. ->  Consider PCSK9 inhibitor      Relevant Medications   Bempedoic Acid (NEXLETOL) 180 MG TABS   Other Relevant Orders   Comprehensive metabolic panel   EKG [id number] (Completed)   CAD S/P percutaneous coronary angioplasty (Chronic)    2 stents placed 1 in the RCA 1 LCx.  Both are DES.  He has been diverted to aspirin alone no longer on  Plavix.   He is okay to hold aspirin 5-7 days preop for any surgeries or procedures.      Relevant Medications   Bempedoic Acid (NEXLETOL) 180 MG TABS   Type 2 diabetes mellitus with complication, without long-term current use of insulin (HCC) (Chronic)    He is on Jardiance which also provides cardiovascular risk benefit  Again diet and exercise are important.  Hypertriglyceridemia is often related to hyperglycemia.      Relevant Orders   Comprehensive metabolic panel   Hemoglobin A1c   Hyperlipidemia associated with type 2 diabetes mellitus ([place]) (Chronic)    Lipids are clearly not well controlled as of May labs.  LDL is a little bit better as of May than it  was as of September 2020, but not nearly at goal.  Plan:   Continue fenofibrate for now, but low threshold to consider switching to the Septra and stopping both fenofibrate and generic omega 3 fatty acids.  Add Nexletol 10 mg.  Recheck labs in 3 months.  Pending results, refer to our clinical pharmacist run lipid clinic to consider initiation of PCSK9 inhibitor.  Weight loss with diet and exercise.      Relevant Medications   Bempedoic Acid (NEXLETOL) 180 MG TABS   Other Relevant Orders   Comprehensive metabolic panel   Lipid panel   Obesity (Chronic)    We talked by the importance of adjusting his diet and getting back and exercise.  He got out of the habit during the COVID-19 lockdown.  This would help both blood pressure, blood sugar and lipid control.      Essential hypertension (Chronic)    Blood pressure looks pretty good on low-dose beta-blocker plus ARB.  With resting heart rate is 62, really cannot push the beta-blocker any further.      Relevant Medications   Bempedoic Acid (NEXLETOL) 180 MG TABS   Other Relevant Orders   EKG 12-Lead (Completed)   Angina pectoris ([place])    With his presentation in 2019, he pretty much had classic anginal symptoms of chest tightness and heaviness with diaphoresis and dyspnea.  No further angina symptoms post PCI.      Relevant Medications   Bempedoic Acid (NEXLETOL) 180 MG TABS   Statin intolerance    Has tried multiple of these for statins: Lovastatin, rosuvastatin, simvastatin and atorvastatin.  Not to any more options other than may be low-dose pravastatin which will not likely be a lot affected with LDL of 101          COVID-19 Education: The signs and symptoms of COVID-19 were discussed with the patient and how to seek care for testing (follow up with PCP or arrange E-visit).   The importance of social distancing and COVID-19 vaccination was discussed today.  I spent a total of 24 minutes with the patient-  in direct  patient consultation.  Additional time spent with chart review  / charting (studies, outside notes, etc): 18 Total Time: 42 min   Current medicines are reviewed at length with the patient today.  (+/- concerns) n/a  Notice: This dictation was prepared with Dragon dictation along with smaller phrase technology. Any transcriptional errors that result from this process are unintentional and may not be corrected upon review.  Patient Instructions / Medication Changes & Studies & Tests Ordered   Patient Instructions  Medication Instructions:  Start Nexletol 180 mg daily    *If you need a refill on your cardiac medications before your next appointment, please  call your pharmacy*   Lab Work: LIPID in 3 months.   If you have labs (blood work) drawn today and your tests are completely normal, you will receive your results only by: Marland Kitchen MyChart Message (if you have MyChart) OR . A paper copy in the mail If you have any lab test that is abnormal or we need to change your treatment, we will call you to review the results.   Follow-Up: At [facility], you and your health needs are our priority.  As part of our continuing mission to provide you with exceptional heart care, we have created designated Provider Care Teams.  These Care Teams include your primary Cardiologist (physician) and Advanced Practice Providers (APPs -  Physician Assistants and Nurse Practitioners) who all work together to provide you with the care you need, when you need it.  We recommend signing up for the patient portal called "MyChart".  Sign up information is provided on this After Visit Summary.  MyChart is used to connect with patients for Virtual Visits (Telemedicine).  Patients are able to view lab/test results, encounter notes, upcoming appointments, etc.  Non-urgent messages can be sent to your provider as well.   To learn more about what you can do with MyChart, go to [email or web address].    Your next  appointment:   12 month(s)  The format for your next appointment:   In Person  Provider:   Glenetta Hew, MD   Studies Ordered:   Orders Placed This Encounter  Procedures  . Comprehensive metabolic panel  . Hemoglobin A1c  . Lipid panel  . EKG 12-Lead     Glenetta Hew, M.D., M.S. Interventional Cardiologist   Pager # [phone] Phone # [phone] [address] [place], [place] [address]   Thank you for choosing Heartcare at [facility]!!

## 2020-04-11 ENCOUNTER — Encounter: Payer: Self-pay | Admitting: Cardiology

## 2020-04-11 NOTE — Assessment & Plan Note (Signed)
2 stents placed 1 in the RCA 1 LCx.  Both are DES.  He has been diverted to aspirin alone no longer on Plavix.   He is okay to hold aspirin 5-7 days preop for any surgeries or procedures.

## 2020-04-11 NOTE — Assessment & Plan Note (Addendum)
He is on Jardiance which also provides cardiovascular risk benefit  Again diet and exercise are important.  Hypertriglyceridemia is often related to hyperglycemia.

## 2020-04-11 NOTE — Assessment & Plan Note (Signed)
Has tried multiple of these for statins: Lovastatin, rosuvastatin, simvastatin and atorvastatin.  Not to any more options other than may be low-dose pravastatin which will not likely be a lot affected with LDL of 101

## 2020-04-11 NOTE — Assessment & Plan Note (Addendum)
Two-vessel disease.  He is on low-dose beta-blocker plus ARB and aspirin.  He is not on statin because of intolerance, and no longer on Plavix.  Thankfully, he is not having any further anginal symptoms.  Plan: Continue current low-dose beta-blocker given his relatively normal blood pressures. We will add Nexletol for additional lipid management. ->  Consider PCSK9 inhibitor

## 2020-04-11 NOTE — Assessment & Plan Note (Addendum)
Lipids are clearly not well controlled as of May labs.  LDL is a little bit better as of May than it was as of September 2020, but not nearly at goal.  Plan:   Continue fenofibrate for now, but low threshold to consider switching to the Septra and stopping both fenofibrate and generic omega 3 fatty acids.  Add Nexletol 10 mg.  Recheck labs in 3 months.  Pending results, refer to our clinical pharmacist run lipid clinic to consider initiation of PCSK9 inhibitor.  Weight loss with diet and exercise.

## 2020-04-11 NOTE — Assessment & Plan Note (Signed)
We talked by the importance of adjusting his diet and getting back and exercise.  He got out of the habit during the COVID-19 lockdown.  This would help both blood pressure, blood sugar and lipid control.

## 2020-04-11 NOTE — Assessment & Plan Note (Signed)
Blood pressure looks pretty good on low-dose beta-blocker plus ARB.  With resting heart rate is 62, really cannot push the beta-blocker any further.

## 2020-04-11 NOTE — Assessment & Plan Note (Signed)
With his presentation in 2019, he pretty much had classic anginal symptoms of chest tightness and heaviness with diaphoresis and dyspnea.  No further angina symptoms post PCI.

## 2020-12-19 ENCOUNTER — Other Ambulatory Visit: Payer: Self-pay | Admitting: Family Medicine

## 2020-12-19 DIAGNOSIS — E1159 Type 2 diabetes mellitus with other circulatory complications: Secondary | ICD-10-CM

## 2020-12-19 DIAGNOSIS — I152 Hypertension secondary to endocrine disorders: Secondary | ICD-10-CM

## 2021-05-10 ENCOUNTER — Telehealth: Payer: Self-pay | Admitting: Cardiology

## 2021-05-10 NOTE — Telephone Encounter (Signed)
Pt c/o of Chest Pain: STAT if CP now or developed within 24 hours  1. Are you having CP right now?  Unsure, patient's wife is not with the patient. She states he is at work.  2. Are you experiencing any other symptoms (ex. SOB, nausea, vomiting, sweating)?  No   3. How long have you been experiencing CP?  Past few weeks   4. Is your CP continuous or coming and going?  Coming and going--patient's wife states a few seconds at a time and then it goes away   5. Have you taken Nitroglycerin?  Patient's wife is unsure whether or not he has any nitro  ?

## 2021-05-10 NOTE — Telephone Encounter (Signed)
Returned call to wife (ok per DPR)-states patient has been experiencing episodes of chest pain for the last 2-3 weeks.  Reports these episodes last 2-3 seconds and resolve.   Reports pain in the center of his chest.  No SOB, no radiation, no N/V/D.   Unsure if pain occurs with exertion but states he stands a lot at his job.   No BP readings from home.   No recent strenuous lifting or activity.  No pain today.    Does not have up to date NTG, has not taken.    Hx: CAD s/p PCI, htn, hld.    Last OV with Dr. Ellyn Hack 03/2020.    Overdue for follow up, appt scheduled 9/29 with Dr. Ellyn Hack.    Advised if symptoms change or worsen to proceed to ER for evaluation.   Wife verbalized understanding.   Routed to MD to make aware/review for further recommendations.

## 2021-05-18 ENCOUNTER — Encounter: Payer: Self-pay | Admitting: Cardiology

## 2021-05-18 ENCOUNTER — Other Ambulatory Visit: Payer: Self-pay

## 2021-05-18 ENCOUNTER — Ambulatory Visit: Payer: 59 | Admitting: Cardiology

## 2021-05-18 VITALS — BP 134/78 | HR 66 | Ht 69.0 in | Wt 264.2 lb

## 2021-05-18 DIAGNOSIS — G720 Drug-induced myopathy: Secondary | ICD-10-CM | POA: Diagnosis not present

## 2021-05-18 DIAGNOSIS — I2510 Atherosclerotic heart disease of native coronary artery without angina pectoris: Secondary | ICD-10-CM | POA: Diagnosis not present

## 2021-05-18 DIAGNOSIS — E785 Hyperlipidemia, unspecified: Secondary | ICD-10-CM

## 2021-05-18 DIAGNOSIS — E1169 Type 2 diabetes mellitus with other specified complication: Secondary | ICD-10-CM

## 2021-05-18 DIAGNOSIS — I25119 Atherosclerotic heart disease of native coronary artery with unspecified angina pectoris: Secondary | ICD-10-CM | POA: Diagnosis not present

## 2021-05-18 DIAGNOSIS — Z9861 Coronary angioplasty status: Secondary | ICD-10-CM | POA: Diagnosis not present

## 2021-05-18 DIAGNOSIS — T466X5A Adverse effect of antihyperlipidemic and antiarteriosclerotic drugs, initial encounter: Secondary | ICD-10-CM

## 2021-05-18 DIAGNOSIS — I10 Essential (primary) hypertension: Secondary | ICD-10-CM | POA: Diagnosis not present

## 2021-05-18 DIAGNOSIS — E1122 Type 2 diabetes mellitus with diabetic chronic kidney disease: Secondary | ICD-10-CM

## 2021-05-18 NOTE — Patient Instructions (Addendum)
Medication Instructions:  No changes  *If you need a refill on your cardiac medications before your next appointment, please call your pharmacy*   Lab Work: Lipid _ fasting    [place] before Oct 18 , 2022 Cmp If you have labs (blood work) drawn today and your tests are completely normal, you will receive your results only by: [place] (if you have MyChart) OR A paper copy in the mail If you have any lab test that is abnormal or we need to change your treatment, we will call you to review the results.   Testing/Procedures: Not needed    Follow-Up: At [facility], you and your health needs are our priority.  As part of our continuing mission to provide you with exceptional heart care, we have created designated Provider Care Teams.  These Care Teams include your primary Cardiologist (physician) and Advanced Practice Providers (APPs -  Physician Assistants and Nurse Practitioners) who all work together to provide you with the care you need, when you need it.     Your next appointment:   12 month(s)  The format for your next appointment:   In Person  Provider:   Glenetta Hew, MD   Other Instructions   You have been referred to  [facility]

## 2021-05-18 NOTE — Progress Notes (Signed)
Primary Care Provider: Jettie Booze, NP Cardiologist: Glenetta Hew, MD Electrophysiologist: None  Clinic Note: Chief Complaint  Patient presents with   Follow-up    Delayed 1 year   Chest Pain    Fleeting episodes lasting couple seconds off and on for the last 2 weeks.   Coronary Artery Disease    ===================================  ASSESSMENT/PLAN   Problem List Items Addressed This Visit       Cardiology Problems   Coronary artery disease involving native coronary artery of native heart with angina pectoris (HCC) (Chronic)    Two-vessel CAD status post two-vessel PCI.  I do not think the symptoms he is having now are cardiac in nature.  Short-lived sharp chest pains or not consistent with his prior anginal symptoms.  Not to be having them now.  Plan:  n aspirin alone along with beta-blocker and ARB-HCTZ. Not on statin due to multiple statin intolerance, is on fenofibrate and Nexletol.-Referring to [facility] lipid clinic. Is on Jardiance for diabetes management along with the glipizide and metformin.      Relevant Orders   EKG 12-Lead (Completed)   Lipid panel   Comprehensive metabolic panel   CAD S/P percutaneous coronary angioplasty (Chronic)    Now on aspirin monotherapy.  Okay to hold aspirin 5 days preop for surgeries or procedures.      Relevant Orders   EKG 12-Lead (Completed)   Hyperlipidemia associated with type 2 diabetes mellitus ([place]) - Primary (Chronic)    He is on fenofibrate at low-dose along with Nexletol, and his lipids as of May were just not controlled.  His triglycerides were elevated, and HDL is low.  Plan: Recheck labs Now and Refer to [facility] Clinical Pharmacist run lipid clinic to discuss possibility of PCSK9 inhibitors versus inclisiran.      Relevant Orders   AMB Referral to [facility] Pharm-D   Lipid panel   Comprehensive metabolic panel   Essential hypertension (Chronic)    Blood pressure looks great on low-dose beta-blocker along  with Benicar-HCTZ at half dose.  For sake of ease regarding switching from Lopressor 12-1/2 twice daily to Toprol 25 a day so he does not cut tablets in half.        Other   Chronic kidney disease due to type 2 diabetes mellitus ([place])   Obesity (Chronic)    We talked again about adjusting diet and increasing exercise to lose weight.      Relevant Orders   AMB Referral to [facility] Pharm-D   Lipid panel   Comprehensive metabolic panel   Statin myopathy (Chronic)    Lipids Just Not Well Controlled on Current Meds.    Will Refer to [facility] after checking labs.      Relevant Orders   AMB Referral to [facility] Pharm-D   Lipid panel   Comprehensive metabolic panel    ===================================  HPI:    David Black is a 60 y.o. male with a PMH below who presents today for annual follow-up.  Cath-PCI 07/22/2018: (Progressive angina) lesion #1 - pLCx 75%-p-mLCx 100% subacute location => DES PCI: (Xience Sierra 3.5 mm 23 mm -- > 4.1 mm) --> OM2 100% PTCA with balloon  ->100% occluded distally -distal thrombo-embolization); lesion #2: dRCA 95% => DES PCI: Xience CR 3.0 mmx 18 mm-3.6 mm.  Anginal equivalent =  chest pain/pressure with significant nausea and diaphoresis.Marland Kitchen   CRFs: HTN, HLD (intolerant to multiple statins), morbid obesity, DM-2.  David Black was last seen  on 04/04/2020 For annual follow-up.  He is doing well.  He had put on weight, noting he was deconditioned.  Was little embarrassed about this, but did not seem to be doing too much about it.  Had some arthritis pains but nothing significant.  No anginal type chest discomfort with rest or exertion.  Some deconditioning related exertional dyspnea but otherwise stable. Added Nexletol with low threshold to consider PCSK9 inhibitor.  Recent Hospitalizations: None  Reviewed  CV studies:    The following studies were reviewed today: (if available, images/films reviewed: From Epic Chart or Care  Everywhere) None:  Interval History:   David Black returns for annual follow-up overall doing pretty well.  He is excited because he just had a new grandchild 3 weeks ago.He really has been doing well from a cardiac standpoint with no active symptoms.   He says has been having off-and-on episodes over the last 2 to 3 weeks of sharp center chest discomfort that may be associated with exertion.  The spells only lasts a couple seconds, and did not get worse with activity, just may have happened with activity.  He says of the last couple days he has been having maybe 1 or 2 episodes a day.  However today he feels fine and is not having any symptoms.  He has been quite active with no chest pain or dyspnea.  CV Review of Symptoms (Summary) Cardiovascular ROS: positive for - chest pain and -May be some exertional dyspnea Due to deconditioning. negative for - chest pain, edema, irregular heartbeat, orthopnea, palpitations, paroxysmal nocturnal dyspnea, rapid heart rate, shortness of breath, or Lightheadedness or dizziness or wooziness, syncope/near syncope or TIA/amaurosis fugax.  REVIEWED OF SYSTEMS   Review of Systems  Constitutional:  Negative for malaise/fatigue (Just some deconditioning.) and weight loss (Actually gained weight).  HENT:  Negative for congestion and nosebleeds.   Respiratory:  Negative for cough and shortness of breath.   Cardiovascular:        Per HPI  Gastrointestinal:  Negative for blood in stool and melena.  Genitourinary:  Negative for dysuria and hematuria.  Musculoskeletal:  Positive for joint pain.  Neurological:  Negative for dizziness and focal weakness.  Psychiatric/Behavioral: Negative.     I have reviewed and (if needed) personally updated the patient's problem list, medications, allergies, past medical and surgical history, social and family history.   PAST MEDICAL HISTORY   Past Medical History:  Diagnosis Date   CAD S/P 2 Vessel DES PCI (p-mLCx, dPCA)  07/22/2018   Cath and PCI 07/22/2018: Severe two-vessel disease with 100% subacute occlusion of the proximal circumflex and 95% stenosis of the distal RCA. Successful DES PCI of the occluded Circumflex with a Xience Sierra DES 3.5 mm x 23 mm (4.1 mm). Successful DES PCI of distal RCA with Xience Sierra DES 3.0 mm 18 mm (3.6 mm) Apparently preserved LVEF with normal LVEDP. ->  EF 55-60%   Depression with anxiety    Diabetes mellitus with complication in adult patient ([place])    On Jardiance   GERD (gastroesophageal reflux disease)    Heart murmur    Nothing seen on echo   Hyperlipidemia    Has tried lovastatin, simvastatin, atorvastatin and rosuvastatin.  Unable to tolerate.  --> On fenofibrate and omega-3 fatty acid   Hypertension    x 10 years   Nocturnal hypoxia 09/14/2013   Obesity, unspecified    Smoker    x 20 years, 1 1/2 ppd, quit 7 years  ago   Unstable angina - Likely Subacute Inferior STEMI 07/2018   Cath: 75% pLCx -> Subacute 100% mLCx (DES PCI); & and 95% dRCA (DES PCI)     PAST SURGICAL HISTORY   Past Surgical History:  Procedure Laterality Date   CHOLECYSTECTOMY     COLONOSCOPY  2006   CORONARY STENT INTERVENTION N/A 07/22/2018   Procedure: CORONARY STENT INTERVENTION;  Surgeon: Leonie Man, MD;  Location: [place] [facility];  Service: Cardiovascular;; DES PCI of the occluded Circumflex with a Xience Sierra DES 3.5 mm x 23 mm (4.1 mm).; DES PCI of distal RCA with Xience Sierra DES 3.0 mm 18 mm (3.6 mm)   FINGER SURGERY     right hand,ring finger   HERNIA REPAIR     LEFT HEART CATH AND CORONARY ANGIOGRAPHY N/A 07/22/2018   Procedure: LEFT HEART CATH AND CORONARY ANGIOGRAPHY;  Surgeon: Leonie Man, MD;  Location: [place] [facility];  Service: Cardiovascular; (progressive angina) Severe two-vessel disease with 100% subacute occlusion of the proximal circumflex and 95% stenosis of the distal RCA.;  Normal LVEF/EDP.   TRANSTHORACIC ECHOCARDIOGRAM  07/22/2018   02/20/2023  normal LV size and function.  EF 55 to 60%.  GR 1 DD.  Mild RV enlargement.  Otherwise normal valves.   UPPER GASTROINTESTINAL ENDOSCOPY  2006   VENTRAL HERNIA REPAIR  06/2010   x3    07/22/2018 2 V PCI ->    Immunization History  Administered Date(s) Administered   Influenza Whole 05/20/2010   Influenza,inj,Quad PF,6+ Mos 06/09/2013, 05/17/2014, 06/01/2015, 05/21/2016, 06/05/2017, 05/30/2018, 05/13/2019   Moderna Sars-Covid-2 Vaccination 07/05/2020   Td 05/19/2013   Tdap 01/25/2006    MEDICATIONS/ALLERGIES   Current Meds  Medication Sig   Ascorbic Acid (VITAMIN C PO) Take by mouth.   aspirin 81 MG tablet Take 81 mg by mouth daily.     Bempedoic Acid (NEXLETOL) 180 MG TABS Take 180 mg by mouth daily.   citalopram (CELEXA) 40 MG tablet Take 1 tablet (40 mg total) by mouth daily. (Patient taking differently: Take 20 mg by mouth daily.)   cyclobenzaprine (FLEXERIL) 10 MG tablet Take 1 tablet (10 mg total) by mouth 3 (three) times daily as needed for muscle spasms.   empagliflozin (JARDIANCE) 25 MG TABS tablet Take 25 mg by mouth daily.   fenofibrate 54 MG tablet Take 1 tablet (54 mg total) by mouth daily.   gabapentin (NEURONTIN) 300 MG capsule TAKE (1) CAPSULE THREE TIMES DAILY.   glucose blood test strip Test blood sugar daily.  Dx E11.9   Lancet Device MISC Test blood sugar once daily E11.9 (uses contour)   meloxicam (MOBIC) 15 MG tablet ALTERNATE TAKING 1/2 TO 1 TABLET DAILY AS DIRECTED   metoprolol tartrate (LOPRESSOR) 25 MG tablet Take 1/2 tablet (12.5 mg total) by mouth 2 (two) times daily.   Multiple Vitamins-Minerals (MULTIVITAMIN MEN PO) Take by mouth.   nitroGLYCERIN (NITROSTAT) 0.4 MG SL tablet Place 1 tablet (0.4 mg total) under the tongue every 5 (five) minutes as needed for chest pain.   olmesartan-hydrochlorothiazide (BENICAR HCT) 20-12.5 MG tablet Take 0.5 tablets by mouth daily.   Omega-3 300 MG CAPS Take by mouth.   pantoprazole (PROTONIX) 40 MG tablet Take 1  tablet (40 mg total) by mouth daily.  Apparently is also taking glipizide 5 mg twice daily and metformin 500 mg twice daily.  Not listed  Allergies  Allergen Reactions   Advicor [Niacin-Lovastatin Er] Other (See Comments)    unknown  Altace [Ramipril] Other (See Comments)    cough   Atorvastatin Other (See Comments)    Muscle aches    Bextra [Valdecoxib] Swelling   Bupropion Hcl Other (See Comments)    Unknown   Janumet [Sitagliptin-Metformin Hcl] Other (See Comments)    unknown   Niacin Other (See Comments)    unknown   Rofecoxib Swelling   Rosuvastatin Other (See Comments)    Muscle aches   Sulfamethoxazole-Trimethoprim Other (See Comments)    unknown   Sulfonamide Derivatives Other (See Comments)    unknown   Zocor [Simvastatin] Other (See Comments)    Muscle aches     SOCIAL HISTORY/FAMILY HISTORY   Reviewed in Epic:  Pertinent findings:  Social History   Tobacco Use   Smoking status: Former    Packs/day: 1.50    Years: 20.00    Pack years: 30.00    Types: Cigarettes    Quit date: 08/20/2002    Years since quitting: 18.8   Smokeless tobacco: Never  Vaping Use   Vaping Use: Never used  Substance Use Topics   Alcohol use: No    Alcohol/week: 0.0 standard drinks   Drug use: No   Social History   Social History Narrative   Married with 1 daughter;      Dorie [facility] to [place] and is trying to transfer his care here    [place] -PE, EKG, labs   Wt Readings from Last 3 Encounters:  05/18/21 264 lb 3.2 oz (119.8 kg)  04/04/20 265 lb 6.4 oz (120.4 kg)  05/13/19 255 lb (115.7 kg)    Physical Exam: BP 134/78   Pulse 66   Ht 5\' 9"  (1.753 m)   Wt 264 lb 3.2 oz (119.8 kg)   SpO2 94%   BMI 39.02 kg/m  Physical Exam Vitals reviewed.  Constitutional:      General: He is not in acute distress.    Appearance: He is not toxic-appearing.     Comments: Morbidly obese.  Well-groomed.  HENT:     Head: Normocephalic and atraumatic.  Neck:     Vascular: No  carotid bruit, hepatojugular reflux or JVD.  Cardiovascular:     Rate and Rhythm: Normal rate and regular rhythm. No extrasystoles are present.    Chest Wall: PMI is not displaced (Difficult to palpate due to body habitus).     Pulses: Normal pulses.     Heart sounds: S1 normal and S2 normal. Heart sounds are distant. No murmur heard.   No friction rub. No gallop.  Pulmonary:     Effort: No respiratory distress.     Breath sounds: Normal breath sounds.  Chest:     Chest wall: No tenderness.  Musculoskeletal:        General: Swelling (Trivial) present. Normal range of motion.     Cervical back: Normal range of motion and neck supple.  Skin:    General: Skin is warm and dry.  Neurological:     General: No focal deficit present.     Mental Status: He is alert and oriented to person, place, and time.  Psychiatric:        Mood and Affect: Mood normal.        Behavior: Behavior normal.        Thought Content: Thought content normal.        Judgment: Judgment normal.     Adult ECG Report  Rate: 66;  Rhythm: normal sinus rhythm and normal axis, intervals and durations. ;  Narrative Interpretation: Stable  Recent Labs: Reviewed [facility] Related to Lipid Panel With LDL/HDL Ratio Component 01/09/21 06/24/20 12/22/19  Cholesterol, Total 166 181 165  Triglycerides 243 High  497 High  204 High   HDL 29 Low  23 Low  28 Low   VLDL Cholesterol Cal 41 High  80 High  36  LDL 96 78 101 High    [facility] Related to POCT Hemoglobin A1C Component 04/11/21 01/09/21 09/29/20 06/24/20 12/22/19  Hemoglobin A1c 7.8 Abnormal  7.1 High   7.4 Abnormal  9.3 High   7.3 High    TSH Component 01/09/21 12/22/19  TSH 1.250 1.080     Lab Results  Component Value Date   HGBA1C 6.2 05/13/2019   ==================================================  COVID-19 Education: The signs and symptoms of COVID-19 were discussed with the patient and how to seek care for testing (follow up with PCP or  arrange E-visit).    I spent a total of 30 minutes with the patient spent in direct patient consultation.  Additional time spent with chart review  / charting (studies, outside notes, etc): 16 min Total Time: 46 min  Current medicines are reviewed at length with the patient today.  (+/- concerns) none  This visit occurred during the SARS-CoV-2 public health emergency.  Safety protocols were in place, including screening questions prior to the visit, additional usage of staff PPE, and extensive cleaning of exam room while observing appropriate contact time as indicated for disinfecting solutions.  Notice: This dictation was prepared with Dragon dictation along with smart phrase technology. Any transcriptional errors that result from this process are unintentional and may not be corrected upon review.  Patient Instructions / Medication Changes & Studies & Tests Ordered   Patient Instructions  Medication Instructions:  No changes  *If you need a refill on your cardiac medications before your next appointment, please call your pharmacy*   Lab Work: Lipid _ fasting    [address]   [facility] before Oct 18 , 2022 Cmp If you have labs (blood work) drawn today and your tests are completely normal, you will receive your results only by: [place] (if you have MyChart) OR A paper copy in the mail If you have any lab test that is abnormal or we need to change your treatment, we will call you to review the results.   Testing/Procedures: Not needed    Follow-Up: At [facility], you and your health needs are our priority.  As part of our continuing mission to provide you with exceptional heart care, we have created designated Provider Care Teams.  These Care Teams include your primary Cardiologist (physician) and Advanced Practice Providers (APPs -  Physician Assistants and Nurse Practitioners) who all work together to provide you with the care you need, when you  need it.     Your next appointment:   12 month(s)  The format for your next appointment:   In Person  Provider:   Glenetta Hew, MD   Other Instructions   You have been referred to  [facility] _ lipid Clinic   Studies Ordered:   Orders Placed This Encounter  Procedures   Lipid panel   Comprehensive metabolic panel   AMB Referral to [facility] Pharm-D   EKG 12-Lead      Glenetta Hew, M.D., M.S. Interventional Cardiologist   Pager # [phone] Phone # [phone] [address] [place], [place] [address]   Thank you for choosing Heartcare at  [facility]!!

## 2021-05-22 ENCOUNTER — Telehealth: Payer: Self-pay

## 2021-05-22 NOTE — Telephone Encounter (Signed)
Lmom pt because they Need to r/s pharmd appt. Will route to nl scheduling to follow up

## 2021-06-06 ENCOUNTER — Ambulatory Visit: Payer: 59

## 2021-06-07 ENCOUNTER — Encounter: Payer: Self-pay | Admitting: Cardiology

## 2021-06-07 NOTE — Assessment & Plan Note (Signed)
Two-vessel CAD status post two-vessel PCI.  I do not think the symptoms he is having now are cardiac in nature.  Short-lived sharp chest pains or not consistent with his prior anginal symptoms.  Not to be having them now.  Plan:   n aspirin alone along with beta-blocker and ARB-HCTZ.  Not on statin due to multiple statin intolerance, is on fenofibrate and Nexletol.-Referring to [facility] lipid clinic.  Is on Jardiance for diabetes management along with the glipizide and metformin.

## 2021-06-07 NOTE — Assessment & Plan Note (Signed)
We talked again about adjusting diet and increasing exercise to lose weight.

## 2021-06-07 NOTE — Assessment & Plan Note (Signed)
Blood pressure looks great on low-dose beta-blocker along with Benicar-HCTZ at half dose.  For sake of ease regarding switching from Lopressor 12-1/2 twice daily to Toprol 25 a day so he does not cut tablets in half.

## 2021-06-07 NOTE — Assessment & Plan Note (Signed)
Now on aspirin monotherapy.  Okay to hold aspirin 5 days preop for surgeries or procedures.

## 2021-06-07 NOTE — Assessment & Plan Note (Signed)
He is on fenofibrate at low-dose along with Nexletol, and his lipids as of May were just not controlled.  His triglycerides were elevated, and HDL is low.  Plan: Recheck labs Now and Refer to [facility] Clinical Pharmacist run lipid clinic to discuss possibility of PCSK9 inhibitors versus inclisiran.

## 2021-06-07 NOTE — Assessment & Plan Note (Signed)
Lipids Just Not Well Controlled on Current Meds.    Will Refer to [facility] after checking labs.

## 2021-06-08 LAB — LIPID PANEL
Chol/HDL Ratio: 7.1 ratio — ABNORMAL HIGH (ref 0.0–5.0)
Cholesterol, Total: 206 mg/dL — ABNORMAL HIGH (ref 100–199)
HDL: 29 mg/dL — ABNORMAL LOW (ref >39)
LDL Chol Calc (NIH): 109 mg/dL — ABNORMAL HIGH (ref 0–99)
Triglycerides: 392 mg/dL — ABNORMAL HIGH (ref 0–149)
VLDL Cholesterol Cal: 68 mg/dL — ABNORMAL HIGH (ref 5–40)

## 2021-06-08 LAB — COMPREHENSIVE METABOLIC PANEL
ALT: 28 IU/L (ref 0–44)
AST: 17 IU/L (ref 0–40)
Albumin/Globulin Ratio: 1.8 (ref 1.2–2.2)
Albumin: 4.7 g/dL (ref 3.8–4.9)
Alkaline Phosphatase: 42 IU/L — ABNORMAL LOW (ref 44–121)
BUN/Creatinine Ratio: 17 (ref 9–20)
BUN: 19 mg/dL (ref 6–24)
Bilirubin Total: 0.3 mg/dL (ref 0.0–1.2)
CO2: 24 mmol/L (ref 20–29)
Calcium: 10.1 mg/dL (ref 8.7–10.2)
Chloride: 103 mmol/L (ref 96–106)
Creatinine, Ser: 1.14 mg/dL (ref 0.76–1.27)
Globulin, Total: 2.6 g/dL (ref 1.5–4.5)
Glucose: 146 mg/dL — ABNORMAL HIGH (ref 70–99)
Potassium: 4.6 mmol/L (ref 3.5–5.2)
Sodium: 141 mmol/L (ref 134–144)
Total Protein: 7.3 g/dL (ref 6.0–8.5)
eGFR: 75 mL/min/{1.73_m2} (ref >59)

## 2021-06-12 ENCOUNTER — Ambulatory Visit: Payer: 59 | Admitting: Cardiology

## 2021-06-13 ENCOUNTER — Other Ambulatory Visit: Payer: Self-pay

## 2021-06-13 ENCOUNTER — Ambulatory Visit (INDEPENDENT_AMBULATORY_CARE_PROVIDER_SITE_OTHER): Payer: 59 | Admitting: Pharmacist Clinician (PhC)/ Clinical Pharmacy Specialist

## 2021-06-13 DIAGNOSIS — E785 Hyperlipidemia, unspecified: Secondary | ICD-10-CM

## 2021-06-13 DIAGNOSIS — E1169 Type 2 diabetes mellitus with other specified complication: Secondary | ICD-10-CM | POA: Diagnosis not present

## 2021-06-13 NOTE — Patient Instructions (Addendum)
Your Results:             Your most recent labs Goal  Total Cholesterol 206 < 200  Triglycerides 392 < 150  HDL (happy/good cholesterol) 29 > 40  LDL (lousy/bad cholesterol 109 < 55   Medication changes:  We will start the process to get Repatha or Praluent covered by your insurance.  We'll call once we get an approval.  If Praluent is the covered drug, go to [email or web address] for information on a copay card  Lab orders:  Repeat labs after 4-6 doses (plan for after January 1)    Thank you for choosing [facility]

## 2021-06-13 NOTE — Progress Notes (Signed)
06/15/2021 David Black 04/20/1961 [id number]   HPI:  David Black is a 60 y.o. male patient of Dr Ellyn Hack, who presents today for a lipid clinic evaluation.  See pertinent past medical history below.  He had 2 DES placed in 2019 after a likely subacute inferior STEMI, and was in to see Dr. Ellyn Hack last month because of off/on chest discomfort.  He was referred to PharmD clinic to discuss options for non-statin cholesterol lowering.   Past Medical History: hypertension Metoprolol tart 12.5 bid, olmesartan 20/12.5 (1/2 tablet) daily  ASCVD 2019 - DES to proximal circumflex and distal RCA  DM2 8/22 A1c 7.8 - on empagliflozin  CKD 10/22 SCr 1.14, GFR 75 (has been up to 1.35 in 2019)  Depression/anxiety On citalopram 20 mg    Current Medications: none  Cholesterol Goals: LDL < 55   Intolerant/previously tried: lovastatin, rosuvastatin, atorvastatin, simvastatin - myalgias  Family history: father died at 42 from MI, had prior CABG, CAD, mom living at 28, with htn, hld, dm; siblings not sure about but no surgeries; 1 daughter, pre-eclampsia  Diet: eats out regularly, fast food/sit down mix; not much veggies in diet; mix of meats; snacks on everything  Exercise:  golf twice weekly with cart; 18 holes  Labs:  5/22: TC 206, TG 392, HDL 29,  LDL 109   Current Outpatient Medications  Medication Sig Dispense Refill   Ascorbic Acid (VITAMIN C PO) Take by mouth.     aspirin 81 MG tablet Take 81 mg by mouth daily.       citalopram (CELEXA) 40 MG tablet Take 1 tablet (40 mg total) by mouth daily. (Patient taking differently: Take 20 mg by mouth daily.) 90 tablet 3   cyclobenzaprine (FLEXERIL) 10 MG tablet Take 1 tablet (10 mg total) by mouth 3 (three) times daily as needed for muscle spasms. 30 tablet 3   empagliflozin (JARDIANCE) 25 MG TABS tablet Take 25 mg by mouth daily. 30 tablet 12   fenofibrate 54 MG tablet Take 1 tablet (54 mg total) by mouth daily. 90 tablet 3   gabapentin  (NEURONTIN) 300 MG capsule TAKE (1) CAPSULE THREE TIMES DAILY. 270 capsule 3   glipiZIDE (GLUCOTROL) 5 MG tablet Take 5 mg by mouth 2 (two) times daily.     glucose blood test strip Test blood sugar daily.  Dx E11.9 100 each 12   Lancet Device MISC Test blood sugar once daily E11.9 (uses contour) 100 each 12   meloxicam (MOBIC) 15 MG tablet ALTERNATE TAKING 1/2 TO 1 TABLET DAILY AS DIRECTED 45 tablet 3   metFORMIN (GLUCOPHAGE-XR) 500 MG 24 hr tablet Take 500 mg by mouth 2 (two) times daily.     metoprolol tartrate (LOPRESSOR) 25 MG tablet Take 1/2 tablet (12.5 mg total) by mouth 2 (two) times daily. 90 tablet 3   Multiple Vitamins-Minerals (MULTIVITAMIN MEN PO) Take by mouth.     olmesartan-hydrochlorothiazide (BENICAR HCT) 20-12.5 MG tablet Take 0.5 tablets by mouth daily. 45 tablet 3   Omega-3 300 MG CAPS Take by mouth.     pantoprazole (PROTONIX) 40 MG tablet Take 1 tablet (40 mg total) by mouth daily. 90 tablet 3   Bempedoic Acid (NEXLETOL) 180 MG TABS Take 180 mg by mouth daily. (Patient not taking: Reported on 06/13/2021) 30 tablet 2   nitroGLYCERIN (NITROSTAT) 0.4 MG SL tablet Place 1 tablet (0.4 mg total) under the tongue every 5 (five) minutes as needed for chest pain. (Patient not taking: Reported on 06/13/2021)  30 tablet 2   Current Facility-Administered Medications  Medication Dose Route Frequency Provider Last Rate Last Admin   0.9 %  sodium chloride infusion  500 mL Intravenous Once Armbruster, Carlota Raspberry, MD        Allergies  Allergen Reactions   Advicor [Niacin-Lovastatin Er] Other (See Comments)    unknown   Altace [Ramipril] Other (See Comments)    cough   Atorvastatin Other (See Comments)    Muscle aches    Bextra [Valdecoxib] Swelling   Bupropion Hcl Other (See Comments)    Unknown   Janumet [Sitagliptin-Metformin Hcl] Other (See Comments)    unknown   Niacin Other (See Comments)    unknown   Rofecoxib Swelling   Rosuvastatin Other (See Comments)    Muscle aches    Sulfamethoxazole-Trimethoprim Other (See Comments)    unknown   Sulfonamide Derivatives Other (See Comments)    unknown   Zocor [Simvastatin] Other (See Comments)    Muscle aches     Past Medical History:  Diagnosis Date   CAD S/P 2 Vessel DES PCI (p-mLCx, dPCA) 07/22/2018   Cath and PCI 07/22/2018: Severe two-vessel disease with 100% subacute occlusion of the proximal circumflex and 95% stenosis of the distal RCA. Successful DES PCI of the occluded Circumflex with a Xience Sierra DES 3.5 mm x 23 mm (4.1 mm). Successful DES PCI of distal RCA with Xience Sierra DES 3.0 mm 18 mm (3.6 mm) Apparently preserved LVEF with normal LVEDP. ->  EF 55-60%   Depression with anxiety    Diabetes mellitus with complication in adult patient ([place])    On Jardiance   GERD (gastroesophageal reflux disease)    Heart murmur    Nothing seen on echo   Hyperlipidemia    Has tried lovastatin, simvastatin, atorvastatin and rosuvastatin.  Unable to tolerate.  --> On fenofibrate and omega-3 fatty acid   Hypertension    x 10 years   Nocturnal hypoxia 09/14/2013   Obesity, unspecified    Smoker    x 20 years, 1 1/2 ppd, quit 7 years ago   Unstable angina - Likely Subacute Inferior STEMI 07/2018   Cath: 75% pLCx -> Subacute 100% mLCx (DES PCI); & and 95% dRCA (DES PCI)    Blood pressure 136/84, pulse 66, resp. rate 16, height 5\' 9"  (1.753 m), weight 268 lb 6.4 oz (121.7 kg), SpO2 95 %.   Hyperlipidemia associated with type 2 diabetes mellitus ([place]) Patient with elevated cholesterol levels (both LDL and triglycerides) and history of ASCVD with DES x 2.  Reviewed options for lowering LDL cholesterol, including ezetimibe, PCSK-9 inhibitors, bempedoic acid and inclisiran.  Discussed mechanisms of action, dosing, side effects and potential decreases in LDL cholesterol.  Also reviewed cost information and potential options for patient assistance.  Answered all patient questions.  Based on this information, patient would  prefer to start PCSK-9 therapy.  Will get PA approved thru his insurance, and once approved, he will need to repeat labs after 4-6 doses.     Tommy Medal PharmD CPP [facility] [address] [employer] [employer] [place] [employer], [place] [address] [phone]

## 2021-06-15 ENCOUNTER — Encounter: Payer: Self-pay | Admitting: Pharmacist Clinician (PhC)/ Clinical Pharmacy Specialist

## 2021-06-15 ENCOUNTER — Telehealth: Payer: Self-pay

## 2021-06-15 DIAGNOSIS — E1169 Type 2 diabetes mellitus with other specified complication: Secondary | ICD-10-CM

## 2021-06-15 DIAGNOSIS — E785 Hyperlipidemia, unspecified: Secondary | ICD-10-CM

## 2021-06-15 MED ORDER — REPATHA SURECLICK 140 MG/ML ~~LOC~~ SOAJ: 140.0000 mg | SUBCUTANEOUS | 11 refills | Status: DC

## 2021-06-15 NOTE — Assessment & Plan Note (Signed)
Patient with elevated cholesterol levels (both LDL and triglycerides) and history of ASCVD with DES x 2.  Reviewed options for lowering LDL cholesterol, including ezetimibe, PCSK-9 inhibitors, bempedoic acid and inclisiran.  Discussed mechanisms of action, dosing, side effects and potential decreases in LDL cholesterol.  Also reviewed cost information and potential options for patient assistance.  Answered all patient questions.  Based on this information, patient would prefer to start PCSK-9 therapy.  Will get PA approved thru his insurance, and once approved, he will need to repeat labs after 4-6 doses.

## 2021-06-15 NOTE — Addendum Note (Signed)
Addended by: Allean Found on: 06/15/2021 12:54 PM   Modules accepted: Orders

## 2021-06-15 NOTE — Telephone Encounter (Signed)
Called and lmom pt that repatha approved, rx sent, instructed the pt to complete fasting labs post 4th dose and to call if cost prohibitive

## 2021-06-15 NOTE — Telephone Encounter (Signed)
Lipid &hepatic panel ordered and released. Repatha sureclick pa sent to plan  David Black (Key: [id number])

## 2021-06-15 NOTE — Telephone Encounter (Signed)
-----   Message from Rockne Menghini, [place] sent at 06/15/2021  6:41 AM EDT ----- Regarding: lipid Please get PA for Repatha 140 mg or Praluent 150 mg - q14d, whichever is covered by his insurance.  Will need repeat labs after 4-6 doses.   Thank you

## 2021-09-05 LAB — LIPID PANEL
Chol/HDL Ratio: 2.7 ratio (ref 0.0–5.0)
Cholesterol, Total: 73 mg/dL — ABNORMAL LOW (ref 100–199)
HDL: 27 mg/dL — ABNORMAL LOW (ref >39)
LDL Chol Calc (NIH): 16 mg/dL (ref 0–99)
Triglycerides: 186 mg/dL — ABNORMAL HIGH (ref 0–149)
VLDL Cholesterol Cal: 30 mg/dL (ref 5–40)

## 2021-09-05 LAB — HEPATIC FUNCTION PANEL
ALT: 41 IU/L (ref 0–44)
AST: 28 IU/L (ref 0–40)
Albumin: 4.6 g/dL (ref 3.8–4.9)
Alkaline Phosphatase: 43 IU/L — ABNORMAL LOW (ref 44–121)
Bilirubin Total: 0.4 mg/dL (ref 0.0–1.2)
Bilirubin, Direct: 0.13 mg/dL (ref 0.00–0.40)
Total Protein: 7.1 g/dL (ref 6.0–8.5)

## 2021-12-20 ENCOUNTER — Other Ambulatory Visit: Payer: Self-pay | Admitting: Family Medicine

## 2022-05-08 ENCOUNTER — Encounter: Payer: Self-pay | Admitting: Cardiology

## 2022-05-08 DIAGNOSIS — E1169 Type 2 diabetes mellitus with other specified complication: Secondary | ICD-10-CM

## 2022-05-08 DIAGNOSIS — I25119 Atherosclerotic heart disease of native coronary artery with unspecified angina pectoris: Secondary | ICD-10-CM

## 2022-05-08 DIAGNOSIS — I2510 Atherosclerotic heart disease of native coronary artery without angina pectoris: Secondary | ICD-10-CM

## 2022-05-10 ENCOUNTER — Telehealth: Payer: Self-pay | Admitting: Pharmacist

## 2022-05-10 MED ORDER — REPATHA SURECLICK 140 MG/ML ~~LOC~~ SOAJ: 140.0000 mg | SUBCUTANEOUS | 11 refills | Status: DC

## 2022-05-10 NOTE — Telephone Encounter (Signed)
PA for Repatha submitte.  Key: [id number]

## 2022-05-11 NOTE — Telephone Encounter (Signed)
PA for Repatha approved through 05/11/23

## 2022-07-20 ENCOUNTER — Encounter: Payer: Self-pay | Admitting: Cardiology

## 2022-07-20 ENCOUNTER — Ambulatory Visit: Payer: 59 | Attending: Cardiology | Admitting: Cardiology

## 2022-07-20 VITALS — BP 112/68 | HR 71 | Ht 69.0 in | Wt 250.6 lb

## 2022-07-20 DIAGNOSIS — I2510 Atherosclerotic heart disease of native coronary artery without angina pectoris: Secondary | ICD-10-CM

## 2022-07-20 DIAGNOSIS — T466X5D Adverse effect of antihyperlipidemic and antiarteriosclerotic drugs, subsequent encounter: Secondary | ICD-10-CM

## 2022-07-20 DIAGNOSIS — I10 Essential (primary) hypertension: Secondary | ICD-10-CM | POA: Diagnosis not present

## 2022-07-20 DIAGNOSIS — G720 Drug-induced myopathy: Secondary | ICD-10-CM

## 2022-07-20 DIAGNOSIS — E1169 Type 2 diabetes mellitus with other specified complication: Secondary | ICD-10-CM

## 2022-07-20 DIAGNOSIS — I25119 Atherosclerotic heart disease of native coronary artery with unspecified angina pectoris: Secondary | ICD-10-CM | POA: Diagnosis not present

## 2022-07-20 DIAGNOSIS — E785 Hyperlipidemia, unspecified: Secondary | ICD-10-CM

## 2022-07-20 DIAGNOSIS — Z9861 Coronary angioplasty status: Secondary | ICD-10-CM | POA: Diagnosis not present

## 2022-07-20 NOTE — Progress Notes (Signed)
Primary Care Provider: Jettie Booze, NP [place] Cardiologist: Glenetta Hew, MD Electrophysiologist: None  Clinic Note: Chief Complaint  Patient presents with   Coronary Artery Disease    No angina.  More active.  Better energy level.  Has not had weight loss since starting Mounjaro.   Follow-up    Annual.  Doing well.    ===================================  ASSESSMENT/PLAN   Overall doing well.  Excellent control of CRF-HTN, HLD and DM-2 along with the patient for weight loss.   No medication changes.  Problem List Items Addressed This Visit       Cardiology Problems   Coronary artery disease involving native coronary artery of native heart with angina pectoris ([place]) - Primary (Chronic)    Two-vessel disease with two-vessel PCI.  No further chest pain or pressure.  More active.  Continue GDMT with aggressive risk factor modification: BP well-controlled with low-dose metoprolol and Benicar-HCTZ-has not required any further titration. Excellent lipid and glycemic control on current meds.  No change Has had significant weight loss with the use of Mounjaro also increased exercise level.        Relevant Orders   EKG 12-Lead (Completed)   CAD S/P percutaneous coronary angioplasty (Chronic)    4 years out from PCI.  David Black is now on aspirin monotherapy.  Doing well.  No bleeding issues.  If necessary, okay to hold aspirin 5 to 7 days preop for surgery or procedure.      Relevant Orders   EKG 12-Lead (Completed)   Hyperlipidemia associated with type 2 diabetes mellitus (HCC) (Chronic)    Statin myopathy therefore not on statin => is now on fenofibrate plus Repatha with excellent lipid control. On combination of Jardiance and Mounjaro along with glipizide with good glycemic control.      Relevant Medications   MOUNJARO 7.5 MG/0.5ML Pen   Essential hypertension (Chronic)     Other   Statin myopathy (Chronic)    Intolerant of lovastatin, simvastatin,  atorvastatin and rosuvastatin as well as pravastatin.  Now on fenofibrate plus Repatha and doing well.      Obesity (Chronic)    No longer morbidly obese.  Seems much healthier.  More active.  Continue dietary changes along with exercise.  And certainly continue Mounjaro.      Relevant Medications   MOUNJARO 7.5 MG/0.5ML Pen    ===================================  HPI:    David Black is a 61 y.o. male with a PMH below who presents today for annual follow-up-4 years out from PCI.  David Black returns today at the request of Jettie Booze, NP.  Cath-PCI 07/22/2018: (Progressive angina) lesion #1 - pLCx 75%-p-mLCx 100% subacute location => DES PCI: (Xience [place] 3.5 mm 23 mm -- > 4.1 mm) --> OM2 100% PTCA with balloon  ->100% occluded distally -distal thrombo-embolization); lesion #2: dRCA 95% => DES PCI: Xience CR 3.0 mmx 18 mm-3.6 mm.  Anginal equivalent =  chest pain/pressure with significant nausea and diaphoresis.Marland Kitchen   CRFs: HTN, HLD (intolerant to multiple statins), morbid obesity, DM-2. => started on Repatha by [facility] Pharm-D team.  [name] was last seen in September 2022-doing very well.  Excited about new grandchild.  Doing well from cardiac standpoint.  Off-and-on episodes of sharp chest discomfort may or may not be associated with exertion.  Lasting a few seconds.Exertional dyspnea due to deconditioning. => Refer to CVRR for management of hyperlipidemia (statin myopathy) -> subsequently started on Repatha.  Tolerating well.  Recent Hospitalizations: None  Reviewed  CV studies:    The following studies were reviewed today: (if available, images/films reviewed: From Epic Chart or Care Everywhere) .none  Interval History:   David Black returns today overall doing quite well.  David Black is happy that David Black is lost about 10 pounds since starting GLP-1 agonist.  David Black is started on Ozempic but had issues getting it covered him tolerance, but David Black is now on [facility] and doing very well.  David Black  notes that David Black feels more energetic and his A1c is just about 7 --  Down from over 8.  In addition to the weight loss David Black is noticing with [facility], David Black is also trying to be more active doing more exercise.  Mostly walking.  Not yet routine, but is definitely trying to increase level of activity.  In doing so, David Black initially was little short of breath but now the more weight David Black loses the less dyspneic David Black is with exertion.  David Black denies any chest pain, pressure or tightness with rest or exertion. No sick No symptoms of PND/orthopnea, and only maybe trivial end of day edema. A few skipped beats here and there but nothing prolonged.  No rapid irregular heartbeats/palpitations or arrhythmias.   No syncope or near-syncope, no TIA or amaurosis fugax.  No claudication.  David Black has occasional nosebleeds off-and-on but nothing significant.  REVIEWED OF SYSTEMS   Review of Systems  Constitutional:  Positive for weight loss (Very happy with 8 to 10 pounds in the last few months since starting Mounjaro.). Negative for malaise/fatigue.  HENT:  Positive for nosebleeds (Occasional). Negative for congestion.   Respiratory:  Negative for cough, sputum production and shortness of breath (Notably improved with weight loss.).   Cardiovascular:  Positive for leg swelling (Well-controlled).  Gastrointestinal:  Negative for blood in stool and melena.  Genitourinary:  Negative for flank pain and hematuria.  Musculoskeletal:  Positive for back pain and joint pain. Negative for falls.  Neurological:  Negative for dizziness and focal weakness.  Psychiatric/Behavioral:  Negative for depression and memory loss. The patient is not nervous/anxious and does not have insomnia.    Joint pain.  Weight gain.  Deconditioned  I have reviewed and (if needed) personally updated the patient's problem list, medications, allergies, past medical and surgical history, social and family history.   PAST MEDICAL HISTORY   Past Medical History:   Diagnosis Date   CAD S/P 2 Vessel DES PCI (p-mLCx, dPCA) 07/22/2018   Cath and PCI 07/22/2018: Severe two-vessel disease with 100% subacute occlusion of the proximal circumflex and 95% stenosis of the distal RCA. Successful DES PCI of the occluded Circumflex with a Xience Sierra DES 3.5 mm x 23 mm (4.1 mm). Successful DES PCI of distal RCA with Xience Sierra DES 3.0 mm 18 mm (3.6 mm) Apparently preserved LVEF with normal LVEDP. ->  EF 55-60%   Depression with anxiety    Diabetes mellitus with complication in adult patient ([place])    On Jardiance   GERD (gastroesophageal reflux disease)    Heart murmur    Nothing seen on echo   Hyperlipidemia    Has tried lovastatin, simvastatin, atorvastatin and rosuvastatin.  Unable to tolerate.  --> On fenofibrate and omega-3 fatty acid   Hypertension    x 10 years   Nocturnal hypoxia 09/14/2013   Obesity, unspecified    Smoker    x 20 years, 1 1/2 ppd, quit 7 years ago   Unstable angina - Likely Subacute Inferior STEMI 07/2018  Cath: 75% pLCx -> Subacute 100% mLCx (DES PCI); & and 95% dRCA (DES PCI)    PAST SURGICAL HISTORY   Past Surgical History:  Procedure Laterality Date   CHOLECYSTECTOMY     COLONOSCOPY  2006   CORONARY STENT INTERVENTION N/A 07/22/2018   Procedure: CORONARY STENT INTERVENTION;  Surgeon: Leonie Man, MD;  Location: [place] [facility];  Service: Cardiovascular;; DES PCI of the occluded Circumflex with a Xience Sierra DES 3.5 mm x 23 mm (4.1 mm).; DES PCI of distal RCA with Xience Sierra DES 3.0 mm 18 mm (3.6 mm)   FINGER SURGERY     right hand,ring finger   HERNIA REPAIR     LEFT HEART CATH AND CORONARY ANGIOGRAPHY N/A 07/22/2018   Procedure: LEFT HEART CATH AND CORONARY ANGIOGRAPHY;  Surgeon: Leonie Man, MD;  Location: [place] [facility];  Service: Cardiovascular; (progressive angina) Severe two-vessel disease with 100% subacute occlusion of the proximal circumflex and 95% stenosis of the distal RCA.;  Normal  LVEF/EDP.   TRANSTHORACIC ECHOCARDIOGRAM  07/22/2018   02/20/2023 normal LV size and function.  EF 55 to 60%.  GR 1 DD.  Mild RV enlargement.  Otherwise normal valves.   UPPER GASTROINTESTINAL ENDOSCOPY  2006   VENTRAL HERNIA REPAIR  06/2010   x3   07/22/2018 2 V PCI -> 100% proximal LCx (Xience [place] DES 3.5 x 23-4.1), 95% distal RCA (Xience [place] DES 3.0 x 18 mm - 3.6 mm) with    Immunization History  Administered Date(s) Administered   Influenza Whole 05/20/2010   Influenza,inj,Quad PF,6+ Mos 06/09/2013, 05/17/2014, 06/01/2015, 05/21/2016, 06/05/2017, 05/30/2018, 05/13/2019   Moderna Sars-Covid-2 Vaccination 10/22/2019, 11/27/2019, 07/05/2020, 02/07/2021   Td 05/19/2013   Tdap 01/25/2006    MEDICATIONS/ALLERGIES   Current Meds  Medication Sig   Ascorbic Acid (VITAMIN C PO) Take by mouth.   aspirin 81 MG tablet Take 81 mg by mouth daily.     citalopram (CELEXA) 40 MG tablet Take 1 tablet (40 mg total) by mouth daily. (Patient taking differently: Take 20 mg by mouth daily.)   cyclobenzaprine (FLEXERIL) 10 MG tablet Take 1 tablet (10 mg total) by mouth 3 (three) times daily as needed for muscle spasms.   empagliflozin (JARDIANCE) 25 MG TABS tablet Take 25 mg by mouth daily.   Evolocumab (REPATHA SURECLICK) 970 MG/ML SOAJ Inject 140 mg into the skin every 14 (fourteen) days.   fenofibrate 54 MG tablet Take 1 tablet (54 mg total) by mouth daily.   gabapentin (NEURONTIN) 300 MG capsule TAKE (1) CAPSULE THREE TIMES DAILY.   glipiZIDE (GLUCOTROL) 5 MG tablet Take 5 mg by mouth 2 (two) times daily.   glucose blood test strip Test blood sugar daily.  Dx E11.9   Lancet Device MISC Test blood sugar once daily E11.9 (uses contour)   meloxicam (MOBIC) 15 MG tablet ALTERNATE TAKING 1/2 TO 1 TABLET DAILY AS DIRECTED   metFORMIN (GLUCOPHAGE-XR) 500 MG 24 hr tablet Take 500 mg by mouth 2 (two) times daily.   metoprolol tartrate (LOPRESSOR) 25 MG tablet Take 1/2 tablet (12.5 mg total) by mouth 2  (two) times daily.   MOUNJARO 7.5 MG/0.5ML Pen Inject into the skin.   Multiple Vitamins-Minerals (MULTIVITAMIN MEN PO) Take by mouth.   nitroGLYCERIN (NITROSTAT) 0.4 MG SL tablet Place 1 tablet (0.4 mg total) under the tongue every 5 (five) minutes as needed for chest pain.   olmesartan-hydrochlorothiazide (BENICAR HCT) 20-12.5 MG tablet Take 0.5 tablets by mouth daily.  Omega-3 300 MG CAPS Take by mouth.   pantoprazole (PROTONIX) 40 MG tablet Take 1 tablet (40 mg total) by mouth daily.   Current Facility-Administered Medications for the 07/20/22 encounter (Office Visit) with Leonie Man, MD  Medication   0.9 %  sodium chloride infusion    Allergies  Allergen Reactions   Advicor [Niacin-Lovastatin Er] Other (See Comments)    unknown   Altace [Ramipril] Other (See Comments)    cough   Atorvastatin Other (See Comments)    Muscle aches    Bextra [Valdecoxib] Swelling   Bupropion Hcl Other (See Comments)    Unknown   Janumet [Sitagliptin-Metformin Hcl] Other (See Comments)    unknown   Niacin Other (See Comments)    unknown   Rofecoxib Swelling   Rosuvastatin Other (See Comments)    Muscle aches   Sulfamethoxazole-Trimethoprim Other (See Comments)    unknown   Sulfonamide Derivatives Other (See Comments)    unknown   Zocor [Simvastatin] Other (See Comments)    Muscle aches     SOCIAL HISTORY/FAMILY HISTORY   Reviewed in Epic:  Pertinent findings:  Social History   Tobacco Use   Smoking status: Former    Packs/day: 1.50    Years: 20.00    Total pack years: 30.00    Types: Cigarettes    Quit date: 08/20/2002    Years since quitting: 19.9   Smokeless tobacco: Never  Vaping Use   Vaping Use: Never used  Substance Use Topics   Alcohol use: No    Alcohol/week: 0.0 standard drinks of alcohol   Drug use: No   Social History   Social History Narrative   Married with 1 daughter;      Dorie Rank to [place] and is trying to transfer his care here    [place]  -PE, EKG, labs   Wt Readings from Last 3 Encounters:  07/20/22 250 lb 9.6 oz (113.7 kg)  06/13/21 268 lb 6.4 oz (121.7 kg)  05/18/21 264 lb 3.2 oz (119.8 kg)    Physical Exam: BP 112/68   Pulse 71   Ht _0  (1.753 m)   Wt 250 lb 9.6 oz (113.7 kg)   SpO2 98%   BMI 37.01 kg/m  -> at least 10 pound weight loss since starting Mounjaro. Physical Exam Vitals reviewed.  Constitutional:      General: David Black is not in acute distress.    Appearance: David Black is obese. David Black is not ill-appearing or toxic-appearing.     Comments: BMI now 37-down from over 40.  Notable weight loss.  Looks healthier  HENT:     Head: Normocephalic and atraumatic.  Neck:     Vascular: No carotid bruit or JVD.  Cardiovascular:     Rate and Rhythm: Normal rate and regular rhythm. No extrasystoles are present.    Chest Wall: PMI is not displaced (Difficult to palpate).     Pulses: Decreased pulses (Pedal pulses are decreased, but palpable.).     Heart sounds: S1 normal and S2 normal. Heart sounds are distant. No murmur heard.    No friction rub. No gallop.  Pulmonary:     Effort: Pulmonary effort is normal. No respiratory distress.     Breath sounds: Normal breath sounds. No wheezing, rhonchi or rales.  Musculoskeletal:        General: No swelling (Trivial). Normal range of motion.     Cervical back: Normal range of motion and neck supple.  Skin:    General: Skin is  dry.  Neurological:     General: No focal deficit present.     Mental Status: David Black is alert and oriented to person, place, and time.  Psychiatric:        Mood and Affect: Mood normal.        Behavior: Behavior normal.        Thought Content: Thought content normal.        Judgment: Judgment normal.     Adult ECG Report  Rate: 71;  Rhythm: normal sinus rhythm and normal axis, intervals and durations. ;   Narrative Interpretation: Stable  Recent Labs:   [place] Related to Lipid Panel With LDL/HDL Ratio Component 01/18/22 01/09/21 06/24/20  12/22/19  Cholesterol, Total 79 Low  166 181 165  Triglycerides 171 High  243 High  497 High  204 High   HDL 28 Low  29 Low  23 Low  28 Low   VLDL Cholesterol Cal 28 41 High  80 High  36  LDL 23 96 78 101 High     POCT Hemoglobin A1C Component 04/30/22 01/18/22 10/23/21 07/25/21  Hemoglobin A1c 7.1 Abnormal  7.3 Abnormal  6.8 Abnormal  8.0 Abnormal     Comprehensive Metabolic Panel Component 91/63/84 10/23/21 01/09/21 09/29/20  Glucose 128 High  108 High  127 High  105 High   BUN _0 Creatinine 1.28 High  1.09 1.15 1.15  eGFR 65 79 74 --  Sodium 139 140 142 142  Potassium 4.2 4.2 4.4 4.3  Chloride 100 103 104 102  CO2 _1 CALCIUM 10.2 9.6 9.7 9.7  Total Protein 7.3 6.9 7.1 7.3  Albumin, Serum 4.4 4.4 4.6 4.6  Total Bilirubin 0.4 0.3 0.4 0.4  Alkaline Phosphatase 39 Low  47 45 46  AST _2 ALT (SGPT) 30 46 High  30 35    Lab Results  Component Value Date   CHOL 73 (L) 09/05/2021   HDL 27 (L) 09/05/2021   LDLCALC 16 09/05/2021   TRIG 186 (H) 09/05/2021   CHOLHDL 2.7 09/05/2021   Lab Results  Component Value Date   CREATININE 1.14 06/08/2021   BUN 19 06/08/2021   NA 141 06/08/2021   K 4.6 06/08/2021   CL 103 06/08/2021   CO2 24 06/08/2021      Latest Ref Rng & Units 07/23/2018    4:03 AM 07/04/2018   12:25 PM 01/15/2018    9:39 AM  CBC  WBC 4.0 - 10.5 K/uL 7.7  7.3  6.2   Hemoglobin 13.0 - 17.0 g/dL 15.6  16.7  15.8   Hematocrit 39.0 - 52.0 % 47.7  47.4  46.5   Platelets 150 - 400 K/uL 227  245  248     Lab Results  Component Value Date   HGBA1C 6.2 05/13/2019   Lab Results  Component Value Date   TSH 1.470 05/13/2019    ================================================== I spent a total of 18 minutes with the patient spent in direct patient consultation.  Additional time spent with chart review  / charting (studies, outside notes, etc): 14 min Total Time: 32 min  Current medicines are reviewed at length with the patient  today.  (+/- concerns) N/A  Notice: This dictation was prepared with Dragon dictation along with smart phrase technology. Any transcriptional errors that result from this process are unintentional and may not be corrected upon review.  Studies Ordered:   Orders Placed This Encounter  Procedures  EKG 12-Lead   No orders of the defined types were placed in this encounter.   Patient Instructions / Medication Changes & Studies & Tests Ordered   Patient Instructions  Medication Instructions:  No changes  *If you need a refill on your cardiac medications before your next appointment, please call your pharmacy*   Lab Work: Not needed    Testing/Procedures:  Not needed  Follow-Up: At [facility], you and your health needs are our priority.  As part of our continuing mission to provide you with exceptional heart care, we have created designated Provider Care Teams.  These Care Teams include your primary Cardiologist (physician) and Advanced Practice Providers (APPs -  Physician Assistants and Nurse Practitioners) who all work together to provide you with the care you need, when you need it.     Your next appointment:   12 month(s)  The format for your next appointment:   In Person  Provider:   Glenetta Hew, MD      Leonie Man, MD, MS Glenetta Hew, M.D., M.S. Interventional Cardiologist  [place]  Pager # [phone] Phone # [phone] [address] [place],  [address]   Thank you for choosing [place] [facility] [place]!!

## 2022-07-20 NOTE — Patient Instructions (Signed)

## 2022-08-13 ENCOUNTER — Encounter: Payer: Self-pay | Admitting: Cardiology

## 2022-08-13 NOTE — Assessment & Plan Note (Signed)
4 years out from PCI.  He is now on aspirin monotherapy.  Doing well.  No bleeding issues.  If necessary, okay to hold aspirin 5 to 7 days preop for surgery or procedure.

## 2022-08-13 NOTE — Assessment & Plan Note (Signed)
Intolerant of lovastatin, simvastatin, atorvastatin and rosuvastatin as well as pravastatin.  Now on fenofibrate plus Repatha and doing well.

## 2022-08-13 NOTE — Assessment & Plan Note (Signed)
No longer morbidly obese.  Seems much healthier.  More active.  Continue dietary changes along with exercise.  And certainly continue Mounjaro.

## 2022-08-13 NOTE — Assessment & Plan Note (Addendum)
Two-vessel disease with two-vessel PCI.  No further chest pain or pressure.  More active.  Continue GDMT with aggressive risk factor modification: BP well-controlled with low-dose metoprolol and Benicar-HCTZ-has not required any further titration. Excellent lipid and glycemic control on current meds.  No change Has had significant weight loss with the use of Mounjaro also increased exercise level.

## 2022-08-13 NOTE — Assessment & Plan Note (Signed)
Statin myopathy therefore not on statin => is now on fenofibrate plus Repatha with excellent lipid control. On combination of Jardiance and Mounjaro along with glipizide with good glycemic control.

## 2023-04-11 ENCOUNTER — Telehealth: Payer: Self-pay | Admitting: Pharmacy Technician

## 2023-04-11 ENCOUNTER — Other Ambulatory Visit (HOSPITAL_COMMUNITY): Payer: Self-pay

## 2023-04-11 NOTE — Telephone Encounter (Signed)
Pharmacy Patient Advocate Encounter   Received notification from Fax that prior authorization for Repatha SureClick 140MG /ML auto-injectors is required/requested.   Insurance verification completed.   The patient is insured through [facility] .   Per test claim: PA required; PA submitted to [facility] via CoverMyMeds Key/confirmation #/EOC [id number] Status is pending

## 2023-04-15 ENCOUNTER — Other Ambulatory Visit (HOSPITAL_COMMUNITY): Payer: Self-pay

## 2023-04-15 NOTE — Telephone Encounter (Signed)
Pharmacy Patient Advocate Encounter  Received notification from [facility] that Prior Authorization for REPATHA has been APPROVED from 04/11/23 to 04/10/24. Ran test claim, Copay is $45. This test claim was processed through [facility] Pharmacy- copay amounts may vary at other pharmacies due to pharmacy/plan contracts, or as the patient moves through the different stages of their insurance plan.

## 2023-04-23 ENCOUNTER — Other Ambulatory Visit: Payer: Self-pay | Admitting: Cardiology

## 2023-04-23 DIAGNOSIS — E1169 Type 2 diabetes mellitus with other specified complication: Secondary | ICD-10-CM

## 2023-04-23 DIAGNOSIS — I2510 Atherosclerotic heart disease of native coronary artery without angina pectoris: Secondary | ICD-10-CM

## 2023-04-23 DIAGNOSIS — I25119 Atherosclerotic heart disease of native coronary artery with unspecified angina pectoris: Secondary | ICD-10-CM

## 2023-07-17 ENCOUNTER — Encounter: Payer: Self-pay | Admitting: Cardiology

## 2023-07-17 ENCOUNTER — Ambulatory Visit: Payer: 59 | Attending: Cardiology | Admitting: Cardiology

## 2023-07-17 VITALS — BP 120/78 | HR 80 | Ht 69.0 in | Wt 224.8 lb

## 2023-07-17 DIAGNOSIS — I10 Essential (primary) hypertension: Secondary | ICD-10-CM

## 2023-07-17 DIAGNOSIS — I2510 Atherosclerotic heart disease of native coronary artery without angina pectoris: Secondary | ICD-10-CM | POA: Diagnosis not present

## 2023-07-17 DIAGNOSIS — E785 Hyperlipidemia, unspecified: Secondary | ICD-10-CM

## 2023-07-17 DIAGNOSIS — E1169 Type 2 diabetes mellitus with other specified complication: Secondary | ICD-10-CM | POA: Diagnosis not present

## 2023-07-17 DIAGNOSIS — Z9861 Coronary angioplasty status: Secondary | ICD-10-CM

## 2023-07-17 DIAGNOSIS — E66813 Obesity, class 3: Secondary | ICD-10-CM

## 2023-07-17 DIAGNOSIS — G720 Drug-induced myopathy: Secondary | ICD-10-CM

## 2023-07-17 DIAGNOSIS — I25119 Atherosclerotic heart disease of native coronary artery with unspecified angina pectoris: Secondary | ICD-10-CM

## 2023-07-17 DIAGNOSIS — T466X5D Adverse effect of antihyperlipidemic and antiarteriosclerotic drugs, subsequent encounter: Secondary | ICD-10-CM

## 2023-07-17 DIAGNOSIS — T466X5A Adverse effect of antihyperlipidemic and antiarteriosclerotic drugs, initial encounter: Secondary | ICD-10-CM

## 2023-07-17 NOTE — Progress Notes (Signed)
Cardiology Office Note:  .   Date:  07/23/2023  ID:  David Black, DOB November 13, 1960, MRN [id number] PCP: April Manson, NP   [facility]Care Providers Cardiologist:  Bryan Lemma, MD     Chief Complaint  Patient presents with   Follow-up    Annual follow-up.  Doing well.   Coronary Artery Disease    Coronary artery disease involving native coronary artery of native heart without active angina pectoris  CAD S/P two-vessel PCI    Essential hypertension Hyperlipidemia associated with type 2 diabetes mellitus (HCC) / Statin myopathy Class 3 severe obesity due to excess calories with serious comorbidity in adult, unspecified BMI (HCC)  Patient Profile: .     David Black is an obese 62 y.o. male former smoker (quit over 20 years ago) with a PMH notable for CAD-PCI (LCx-OM, RCA), HTN & HLD (statin myopathy) who presents here for annual f/u at the request of White, Bonnell Public, NP.  Cath-PCI 07/22/2018: (Progressive angina) lesion #1 - pLCx 75%-p-mLCx 100% subacute location => DES PCI: (Xience Sierra 3.5 mm 23 mm -- > 4.1 mm) --> OM2 100% PTCA with balloon  ->100% occluded distally -distal thrombo-embolization); lesion #2: dRCA 95% => DES PCI: Xience CR 3.0 mmx 18 mm-3.6 mm.  Anginal equivalent =  chest pain/pressure with significant nausea and diaphoresis.Marland Kitchen   CRFs: HTN, HLD (intolerant to multiple statins), morbid obesity, DM-2. => started on Repatha by [facility] Pharm-D team.    David Black was last seen on July 20, 2022.  He was doing very well.  Lost about 10 pounds since having started on GLP-1 agonist (Ozempic that was then converted to [facility]).  Noted much better energy and A1c as well as lipids improved.  Was also on Repatha.  Other than some off-and-on nosebleeds, doing fairly well.  Walking but not regularly.  Try and increase level of activity.  Distal mild exertional dyspnea due to deconditioning and obesity.  Subjective  Discussed the use of AI scribe software for  clinical note transcription with the patient, who gave verbal consent to proceed.  History of Present Illness   David Black, a patient with a history of coronary artery disease - 2 V PCI, presents for a routine follow-up. He reports no chest pain, pressure, or tightness. He has not had any recent hospitalizations or studies done. He denies any swelling in his legs, shortness of breath while laying flat, or waking up short of breath. He also denies any feelings of his heart racing, skipping, or flip-flopping. He occasionally experiences lightheadedness upon standing, a symptom he has had since childhood.  The patient is currently on Repatha for cholesterol management, with labs from June showing a total cholesterol of 71, triglycerides of 127, HDL of 30, and LDL of 18. His glucose was 109, sodium 141, potassium 4.3, chloride 105, bicarb 22, BUN 16, and creatinine 1.25. His calcium was 10, AST 22, ALT 33, and alk phos 42. His A1c was 6.0 in September, down from 6.3 in June.  For his diabetes, he is on [place], [place], and metformin. He has lost some weight since starting Mounjaro. For his blood pressure, he is on a baby dose of metoprolol and Olmesartan0-HCTZ. He is also on aspirin for his coronary artery disease.  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 He reports no issues with his activity level. He is approximately five years out from his stent placement and  has not had any symptoms since.     Cardiovascular ROS: positive for - dyspnea on exertion and this is more related to deconditioning and obesity as opposed to true cardiac symptom.  Mild end of day swelling. negative for - chest pain, irregular heartbeat, orthopnea, palpitations, paroxysmal nocturnal dyspnea, rapid heart rate, shortness of breath, or lightheadedness, dizziness, syncope or near syncope, TIA/CVA or amaurosis fugax, claudication.  ROS:  Review of Systems -  Negative except symptoms noted in HPI    Objective   Medications - Repatha 140 mg injection every 2 weeks - Mounjaro 7.5 mg / 0.5 mL weekly - Jardiance 25 mg daily - Glipizide 5 mg twice daily - Fenofibrate 54 mg daily - Metformin-XR 500 mg twice daily - Metoprolol 12.5 mg twice a day - Benicar HCTZ 20-12.5 mg -take 1/2 tab daily) - Aspirin 81 mg daily - Omega-3 fatty acids 300 mg daily - Pantoprazole 40 mg daily - Citalopram 40 mg daily - Flexeril 10 mg 3 times daily as needed; Mobic 15 mg 1 or 1/2 tab daily   Studies Reviewed: Marland Kitchen   EKG Interpretation Date/Time:  Wednesday July 17 2023 16:02:39 EST Ventricular Rate:  80 PR Interval:  184 QRS Duration:  100 QT Interval:  358 QTC Calculation: 412 R Axis:   14  Text Interpretation: Normal sinus rhythm Inferior infarct (cited on or before 22-Jul-2018) When compared with ECG of 23-Jul-2018 04:40, Inverted T waves have replaced nonspecific T wave abnormality in Inferior leads Confirmed by Bryan Lemma ([id number]) on 07/23/2023 12:02:27 AM    ECHO 07/22/2018: Normal LV EF 55-60%. Gr  1 DD. Mild RV dilation, Normal Valves.  CATH - PCI 07/22/2018:  Severe two-vessel disease with 100% subacute occlusion of the proximal circumflex and 95% stenosis of the distal RCA. Successful DES PCI of the occluded Circumflex with a Xience Sierra DES 3.5 mm x 23 mm (4.1 mm). Successful DES PCI of distal RCA with Xience Sierra DES 3.0 mm 18 mm (3.6 mm) Apparently preserved LVEF with normal LVEDP.       [facility] Component 05/09/23 02/05/23 11/01/22  Hemoglobin A1c 6.0 Abnormal  6.3 Abnormal  6.3 Abnormal    Related to Lipid Panel With LDL/HDL Ratio Component 02/05/23 01/18/22 01/09/21  Cholesterol, Total 71 Low  79 Low  166  Triglycerides 127 171 High  243 High   HDL 30 Low  28 Low  29 Low   VLDL Cholesterol Cal 23 28 41 High   LDL 18 23 96  Comprehensive metabolic panel Component 02/05/23 11/01/22 01/18/22  Glucose 109 High  97 128  High   BUN 16 21 17   Creatinine 1.25 1.19 1.28 High   eGFR 66 70 65  BUN/Creatinine Ratio 13 18 13   Sodium 141 142 139  Potassium 4.3 4.4 4.2  Chloride 105 104 100  CO2 22 25 25   CALCIUM 10.0 9.7 10.2  Total Protein 6.8 6.8 7.3  Albumin, Serum 4.3 4.5 4.4  Globulin, Total 2.5 2.3 2.9  Total Bilirubin 0.4 0.5 0.4  Alkaline Phosphatase 42 Low  41 Low  39 Low   AST 22 22 21   ALT (SGPT) 33 30 30   WBC 5.7 6.3  RBC 5.43 5.31  Hemoglobin 16.4 16.7  Hematocrit 49.0 48.3  MCV 90 91  MCH 30.2 31.5  MCHC 33.5 34.6  RDW 13.1 13.0  Platelet Count 218 213    Risk Assessment/Calculations:          Physical Exam:   VS:  BP 120/78 (BP Location: Left Arm, Patient Position: Sitting, Cuff Size: Large)   Pulse 80   Ht 5\' 9"  (1.753 m)   Wt 224 lb 12.8 oz (102 kg)   SpO2 94%   BMI 33.20 kg/m    Wt Readings from Last 3 Encounters:  07/17/23 224 lb 12.8 oz (102 kg)  07/20/22 250 lb 9.6 oz (113.7 kg)  06/13/21 268 lb 6.4 oz (121.7 kg)    GEN: Well nourished, well developed in no acute distress; obese but well-groomed.  Notable weight loss from previous visit. NECK: No JVD; No carotid bruits CARDIAC: RRR, distant, but normal S1&S2; no murmurs, rubs, gallops; diminished pedal pulses RESPIRATORY:  Clear to auscultation without rales, wheezing or rhonchi ; nonlabored, good air movement. ABDOMEN: Soft, non-tender, non-distended EXTREMITIES:  No edema; No deformity     ASSESSMENT AND PLAN: .    Problem List Items Addressed This Visit       Cardiology Problems   CAD S/P percutaneous coronary angioplasty (Chronic)    He is now on aspirin 81 mg maintenance dose for two-vessel PCI.  Doing well with no bleeding issues.  Okay to hold aspirin 5 to 7 days preop for surgeries or procedures.      Coronary artery disease involving native coronary artery of native heart without angina pectoris - Primary (Chronic)    5+ years out from PCI x 2 for progressive angina.  He remains stable, no  symptoms.  He is on this dose aspirin, Benicar-HCTZ, Lopressor  along with Repatha, and fenofibrate plus omega-3 fatty acids for lipids and Mounjaro, Jardiance and metformin plus glipizide for diabetes.  -Continue current medications. -Annual follow-up.      Relevant Orders   EKG 12-Lead (Completed)   Essential hypertension (Chronic)    Well controlled on low dose metoprolol and olmesartan/HCTZ. -Continue current medications.      Hyperlipidemia associated with type 2 diabetes mellitus (HCC) (Chronic)    Recent labs from June 2024 show excellent lipid control with total cholesterol 71, triglycerides 127, HDL 30, LDL 18.  -Continue combination of Repatha along with fenofibrate and omega-3 fatty acids.  A1c steadily decreasing, currently at 6.0 on September labs.  On Mounjaro, Jardiance, and metformin plus glipizide. -Continue current medications. -Managed by primary care provider.        Other   Obesity (Chronic)    No longer morbidly obese, continues to lose weight and doing well.  The more weight he loses, the more energy he has.  Encouraged that he stays adequately hydrated well on GLP-1 agonist maintains his hydration and p.o. intake despite lack of appetite.  Increase exercise level.      Statin myopathy (Chronic)    We tried several different medications including lovastatin, simvastatin, atorvastatin as well as rosuvastatin and pravastatin.  All led to significant myalgias.  He has tolerated low-dose fenofibrate for hypertriglyceridemia, but still needed additional control and was therefore started on Repatha with now excellent control.  Tolerating well.             Follow-Up: Return in about 1 year (around 07/16/2024).  Total time spent: 19 min spent with patient + 18 min spent charting = 37 min    Signed, Marykay Lex, MD, MS Bryan Lemma, M.D., M.S. Interventional Cardiologist  [facility]  Pager # [phone] Phone # [phone] [address] [place], [place] [address]

## 2023-07-17 NOTE — Patient Instructions (Signed)
Medication Instructions:  No changes *If you need a refill on your cardiac medications before your next appointment, please call your pharmacy*   Lab Work: Not needed   Testing/Procedures:  Not needed Follow-Up: At [facility], you and your health needs are our priority.  As part of our continuing mission to provide you with exceptional heart care, we have created designated Provider Care Teams.  These Care Teams include your primary Cardiologist (physician) and Advanced Practice Providers (APPs -  Physician Assistants and Nurse Practitioners) who all work together to provide you with the care you need, when you need it.     Your next appointment:   12 month(s)  The format for your next appointment:   In Person  Provider:   Bryan Lemma, MD

## 2023-07-23 ENCOUNTER — Encounter: Payer: Self-pay | Admitting: Cardiology

## 2023-07-23 NOTE — Assessment & Plan Note (Signed)
Well controlled on low dose metoprolol and olmesartan/HCTZ. -Continue current medications.

## 2023-07-23 NOTE — Assessment & Plan Note (Addendum)
Recent labs from June 2024 show excellent lipid control with total cholesterol 71, triglycerides 127, HDL 30, LDL 18.  -Continue combination of Repatha along with fenofibrate and omega-3 fatty acids.  A1c steadily decreasing, currently at 6.0 on September labs.  On Mounjaro, Jardiance, and metformin plus glipizide. -Continue current medications. -Managed by primary care provider.

## 2023-07-23 NOTE — Assessment & Plan Note (Addendum)
5+ years out from PCI x 2 for progressive angina.  He remains stable, no symptoms.  He is on this dose aspirin, Benicar-HCTZ, Lopressor  along with Repatha, and fenofibrate plus omega-3 fatty acids for lipids and Mounjaro, Jardiance and metformin plus glipizide for diabetes.  -Continue current medications. -Annual follow-up.

## 2023-07-23 NOTE — Assessment & Plan Note (Signed)
We tried several different medications including lovastatin, simvastatin, atorvastatin as well as rosuvastatin and pravastatin.  All led to significant myalgias.  He has tolerated low-dose fenofibrate for hypertriglyceridemia, but still needed additional control and was therefore started on Repatha with now excellent control.  Tolerating well.

## 2023-07-23 NOTE — Assessment & Plan Note (Signed)
He is now on aspirin 81 mg maintenance dose for two-vessel PCI.  Doing well with no bleeding issues.  Okay to hold aspirin 5 to 7 days preop for surgeries or procedures.

## 2023-07-23 NOTE — Assessment & Plan Note (Signed)
No longer morbidly obese, continues to lose weight and doing well.  The more weight he loses, the more energy he has.  Encouraged that he stays adequately hydrated well on GLP-1 agonist maintains his hydration and p.o. intake despite lack of appetite.  Increase exercise level.

## 2024-05-07 ENCOUNTER — Telehealth: Payer: Self-pay | Admitting: Cardiology

## 2024-05-07 NOTE — Telephone Encounter (Signed)
 Pt c/o medication issue:  1. Name of Medication:   Evolocumab  (REPATHA  SURECLICK) 140 MG/ML SOAJ   2. How are you currently taking this medication (dosage and times per day)?   As prescribed  3. Are you having a reaction (difficulty breathing--STAT)?   4. What is your medication issue?   Wife Geophysical data processor) stated patient will need to have this medication pre-authorized and wants new prescription sent to [facility], [place] - [address]

## 2024-05-12 ENCOUNTER — Other Ambulatory Visit (HOSPITAL_COMMUNITY): Payer: Self-pay

## 2024-05-12 ENCOUNTER — Other Ambulatory Visit: Payer: Self-pay | Admitting: Cardiology

## 2024-05-12 DIAGNOSIS — I25119 Atherosclerotic heart disease of native coronary artery with unspecified angina pectoris: Secondary | ICD-10-CM

## 2024-05-12 DIAGNOSIS — I2510 Atherosclerotic heart disease of native coronary artery without angina pectoris: Secondary | ICD-10-CM

## 2024-05-12 DIAGNOSIS — E1169 Type 2 diabetes mellitus with other specified complication: Secondary | ICD-10-CM

## 2024-05-12 NOTE — Telephone Encounter (Signed)
Rx [facility]

## 2024-05-12 NOTE — Telephone Encounter (Signed)
 Pharmacy Patient Advocate Encounter   Received notification from Physician's Office that prior authorization for REPATHA is required/requested.   Insurance verification completed.   The patient is insured through [facility] .   Per test claim: The current 28 day co-pay is, $45.  No PA needed at this time. This test claim was processed through [facility]- copay amounts may vary at other pharmacies due to pharmacy/plan contracts, or as the patient moves through the different stages of their insurance plan.

## 2024-07-22 ENCOUNTER — Encounter: Payer: Self-pay | Admitting: Cardiology

## 2024-07-22 ENCOUNTER — Ambulatory Visit: Attending: Cardiology | Admitting: Cardiology

## 2024-07-22 VITALS — BP 110/72 | HR 65 | Ht 69.0 in | Wt 220.0 lb

## 2024-07-22 DIAGNOSIS — E1169 Type 2 diabetes mellitus with other specified complication: Secondary | ICD-10-CM | POA: Diagnosis not present

## 2024-07-22 DIAGNOSIS — E785 Hyperlipidemia, unspecified: Secondary | ICD-10-CM

## 2024-07-22 DIAGNOSIS — I10 Essential (primary) hypertension: Secondary | ICD-10-CM

## 2024-07-22 DIAGNOSIS — T466X5D Adverse effect of antihyperlipidemic and antiarteriosclerotic drugs, subsequent encounter: Secondary | ICD-10-CM

## 2024-07-22 DIAGNOSIS — G720 Drug-induced myopathy: Secondary | ICD-10-CM

## 2024-07-22 DIAGNOSIS — I2510 Atherosclerotic heart disease of native coronary artery without angina pectoris: Secondary | ICD-10-CM

## 2024-07-22 DIAGNOSIS — Z9861 Coronary angioplasty status: Secondary | ICD-10-CM

## 2024-07-22 NOTE — Progress Notes (Signed)
 Cardiology Office Note:  .   Date:  07/27/2024  ID:  David Black, DOB 1961-06-12, MRN [id number] PCP: Teresa Aldona CROME, NP  [facility] Providers Cardiologist:  Alm Clay, MD     Chief Complaint  Patient presents with   Follow-up    Annual follow-up.  Doing well.   Coronary Artery Disease    No further angina since two-vessel PCI for angina in 2019    Patient Profile: .     David Black is a mildly obese 63 y.o. male former smoker (quit over 20 years ago) with a PMH no who presents here for annual follow-up at the request of White, Aldona CROME, NP.  PMH: Cath-PCI 07/22/2018: (Progressive angina) lesion #1 - pLCx 75%-p-mLCx 100% subacute location => DES PCI: (Xience Sierra 3.5 mm 23 mm -- > 4.1 mm) --> OM2 100% PTCA with balloon  ->100% occluded distally -distal thrombo-embolization); lesion #2: dRCA 95% => DES PCI: Xience CR 3.0 mmx 18 mm-3.6 mm.  Anginal equivalent =  chest pain/pressure with significant nausea and diaphoresis.SABRA   CRFs: HTN, HLD (intolerant to multiple statins), morbid obesity, DM-2. => started on Repatha  by [facility] Pharm-D team.      David Black was last seen on 07/17/2023: He was noting exertional dyspnea likely related to deconditioning and obesity.  Mild on today swelling but otherwise no real symptoms.  Subjective  Discussed the use of AI scribe software for clinical note transcription with the patient, who gave verbal consent to proceed.  History of Present Illness David Black is a 63 year old male with coronary artery disease who presents for an annual follow-up visit.  It has been six years since his stent placement on July 22, 2018, with no recurrence of chest pain, pressure, nausea, or sweating since the procedure. His last echocardiogram was performed on the day of his stent placement.  He is currently on Repatha  for cholesterol management, with his last cholesterol check six months ago showing a total cholesterol of 78,  triglycerides 82, HDL 36, and LDL 25. He also takes fenofibrate  and omega-3 fatty acids for triglycerides. His A1c has improved from 6.3% in June 2024 to 5.8% in September 2025. He is on Jardiance , Mounjaro, metformin, and glipizide for diabetes management.  He reports a weight loss of about 40 pounds over the past two years, from 252 pounds to 220 pounds, attributed to his current medication regimen, including Mounjaro 7.5 mg. No shortness of breath, chest pain, or palpitations. Occasionally experiences lightheadedness when standing up.  His current medications include olmesartan  HCTZ 20/12.5 mg, taken as half a tablet twice a day for blood pressure management, and aspirin . He stopped taking Plavix a couple of years ago. He is also on Neurontin  300 mg three times a day for neuropathy and has discontinued Mobic  and Protonix .  He has a history of smoking for 20 years but quit on his 70th birthday. Denies any current tobacco use.  Cardiovascular ROS: no chest pain or dyspnea on exertion positive for - notable weight loss over the last several years.  Leg pain from neuropathy negative for - edema, irregular heartbeat, orthopnea, palpitations, paroxysmal nocturnal dyspnea, rapid heart rate, shortness of breath, or lightheadedness, dizziness or wooziness, syncope or near syncope, TIA or amaurosis fugax.  Claudication.  ROS:  Review of Systems - otherwise negative    Objective   Medications - Aspirin  81 mg daily - Repatha  140 mg injection every 2 weeks;- Fenofibrate  54 mg daily;- Omega-3  fatty acids 300 mg daily - Mounjaro 7.5 mg / 0.5 mL weekly; - Jardiance  25 mg daily;- Glipizide 5 mg twice daily;- Metformin-XR 500 mg twice daily - Metoprolol  to tartrate 12.5 mg twice a day; - Benicar -HCTZ 20-12.5 mg -take 1/2 tab twice daily  - Citalopram  40 mg daily - Flexeril  10 mg 3 times daily as needed; Mobic  15 mg 1 or 1/2 tab daily   Studies Reviewed: SABRA   EKG Interpretation Date/Time:  Wednesday  July 22 2024 15:32:22 EST Ventricular Rate:  65 PR Interval:  204 QRS Duration:  106 QT Interval:  406 QTC Calculation: 422 R Axis:   -2  Text Interpretation: Normal sinus rhythm Inferior infarct (cited on or before 22-Jul-2018) When compared with ECG of 17-Jul-2023 16:02, No significant change was found Confirmed by Anner Lenis ([id number]) on 07/22/2024 4:13:32 PM    Results LABS Total cholesterol: 78 (02/07/2024) Triglycerides: 82 (02/07/2024) HDL: 36 (02/07/2024) LDL: 25 (02/07/2024) A1c: 5.8 (05/07/2024) TSH: 1.59 (02/07/2024) Creatinine: 1.35 (02/07/2024) Potassium: 4.3 (02/07/2024)  DIAGNOSTIC Echocardiogram: EF 55-60%, normal wall motion, grade 1 diastolic dysfunction (07/22/2018) Cardiac Catheterization: Right Dominant with severe two-vessel disease..  Culprit Lesion Segment: Proximal LCx 75%, proximal to mid LCx 100% occlusion Celedonio DES 3.5 mm 23 mm postdilated to 4.1 mm, distal OM 2 thromboembolic occlusion and PTCA with 2.5 mm balloon resulting in apical occlusion with TIMI II flow to the apex.  Lesion #2 distal RCA 95%-severe DES 3.0 x 18 mm post and to 3.6 mm.  LVEF 55-65 % normal LVEDP.  (07/22/2018)  Diagnostic        Intervention       Risk Assessment/Calculations:             Physical Exam:   VS:  BP 110/72 (BP Location: Right Arm, Patient Position: Sitting, Cuff Size: Large)   Pulse 65   Ht 5' 9 (1.753 m)   Wt 220 lb (99.8 kg)   SpO2 94%   BMI 32.49 kg/m    Wt Readings from Last 3 Encounters:  07/22/24 220 lb (99.8 kg)  07/17/23 224 lb 12.8 oz (102 kg)  07/20/22 250 lb 9.6 oz (113.7 kg)    GEN: Well nourished, well groomed; in no acute distress; mildly obese with notable weight loss NECK: No JVD; No carotid bruits CARDIAC: Normal S1, S2; RRR, no murmurs, rubs, gallops RESPIRATORY:  Clear to auscultation without rales, wheezing or rhonchi ; nonlabored, good air movement. ABDOMEN: Soft, non-tender, non-distended EXTREMITIES:  No edema; No  deformity      ASSESSMENT AND PLAN: .    Problem List Items Addressed This Visit       Cardiology Problems   CAD S/P percutaneous coronary angioplasty (Chronic)   On aspirin  monotherapy as he has many years out from PCI.  Okay to hold aspirin  5 to 7 days preop for surgeries or procedures.      Coronary artery disease involving native coronary artery of native heart without angina pectoris - Primary (Chronic)   Coronary artery disease with stent placement in LCx and RCA 2019.  No recurrent symptoms. EKG stable. Normal ejection fraction and wall motion previously. No heart failure or arrhythmias. On aspirin  therapy. - Continue aspirin  monotherapy. - Continue low-dose Lopressor  12.5 [place] twice daily along with Benicar -HCTZ 20-12.5 mg (taking 1/2 tablet twice daily) - Continue combination of Repatha , fenofibrate  and omega-3 fatty acids for lipid management      Essential hypertension (Chronic)   Well-controlled with current regimen. Blood pressure readings are excellent. -  Continue current antihypertensive medication: olmesartan  HCTZ plus metoprolol  tartrate:. - Metoprolol  to tartrate 12.5 mg twice a day; - Benicar -HCTZ 20-12.5 mg -take 1/2 tab twice daily      Relevant Orders   EKG 12-Lead (Completed)   Hyperlipidemia associated with type 2 diabetes mellitus (HCC) (Chronic)   Well-controlled with Repatha  and fenofibrate . Excellent cholesterol levels: total 78, triglycerides 82, HDL 36, LDL 25. - Continue current lipid-lowering therapy: Repatha  and fenofibrate  and omega-3 fatty acids.  DM-2 well-controlled with medications. A1c improved from 6.3% to 5.8%. 40-pound weight loss due to medication and lifestyle changes. - Continue current diabetes medications: Jardiance , Mounjaro, metformin, and glipizide.  Continue current meds: - Repatha  140 mg injection every 2 weeks;- Fenofibrate  54 mg daily;- Omega-3 fatty acids 300 mg daily - Mounjaro 7.5 mg / 0.5 mL weekly; - Jardiance  25 mg  daily;- Glipizide 5 mg twice daily;- Metformin-XR 500 mg twice daily        Other   Morbid obesity (HCC)   Now only mildly obese with 40 pound weight loss over the last 2 years due to medication changes and lifestyle changes. - Continue current weight management strategies.      Statin myopathy (Chronic)   Myalgias with multiple different statins including lovastatin, simvastatin, atorvastatin  and rosuvastatin. Now is on combination of fenofibrate , Repatha  and fish oil with well-controlled lipids.              Follow-Up: Return in about 1 year (around 07/22/2025) for [employer], 1 Yr Follow-up.   Signed, Alm MICAEL Clay, MD, MS Alm Clay, M.D., M.S. Interventional Cardiologist  [facility] Pager # [phone]

## 2024-07-22 NOTE — Patient Instructions (Addendum)
Medication Instructions:  No changes  *If you need a refill on your cardiac medications before your next appointment, please call your pharmacy*   Lab Work: Not needed If you have labs (blood work) drawn today and your tests are completely normal, you will receive your results only by: . MyChart Message (if you have MyChart) OR . A paper copy in the mail If you have any lab test that is abnormal or we need to change your treatment, we will call you to review the results.   Testing/Procedures:  Not needed  Follow-Up: At [facility], you and your health needs are our priority.  As part of our continuing mission to provide you with exceptional heart care, we have created designated Provider Care Teams.  These Care Teams include your primary Cardiologist (physician) and Advanced Practice Providers (APPs -  Physician Assistants and Nurse Practitioners) who all work together to provide you with the care you need, when you need it.   Your next appointment:   12 month(s)  The format for your next appointment:   In Person  Provider:   David Harding, MD  

## 2024-07-27 ENCOUNTER — Encounter: Payer: Self-pay | Admitting: Cardiology

## 2024-07-27 NOTE — Assessment & Plan Note (Signed)
 Well-controlled with Repatha  and fenofibrate . Excellent cholesterol levels: total 78, triglycerides 82, HDL 36, LDL 25. - Continue current lipid-lowering therapy: Repatha  and fenofibrate  and omega-3 fatty acids.  DM-2 well-controlled with medications. A1c improved from 6.3% to 5.8%. 40-pound weight loss due to medication and lifestyle changes. - Continue current diabetes medications: Jardiance , Mounjaro, metformin, and glipizide.  Continue current meds: - Repatha  140 mg injection every 2 weeks;- Fenofibrate  54 mg daily;- Omega-3 fatty acids 300 mg daily - Mounjaro 7.5 mg / 0.5 mL weekly; - Jardiance  25 mg daily;- Glipizide 5 mg twice daily;- Metformin-XR 500 mg twice daily

## 2024-07-27 NOTE — Assessment & Plan Note (Signed)
 Coronary artery disease with stent placement in LCx and RCA 2019.  No recurrent symptoms. EKG stable. Normal ejection fraction and wall motion previously. No heart failure or arrhythmias. On aspirin  therapy. - Continue aspirin  monotherapy. - Continue low-dose Lopressor  12.5 Miller twice daily along with Benicar -HCTZ 20-12.5 mg (taking 1/2 tablet twice daily) - Continue combination of Repatha , fenofibrate  and omega-3 fatty acids for lipid management

## 2024-07-27 NOTE — Assessment & Plan Note (Signed)
 Now only mildly obese with 40 pound weight loss over the last 2 years due to medication changes and lifestyle changes. - Continue current weight management strategies.

## 2024-07-27 NOTE — Assessment & Plan Note (Signed)
 On aspirin  monotherapy as he has many years out from PCI.  Okay to hold aspirin  5 to 7 days preop for surgeries or procedures.

## 2024-07-27 NOTE — Assessment & Plan Note (Signed)
 Well-controlled with current regimen. Blood pressure readings are excellent. - Continue current antihypertensive medication: olmesartan  HCTZ plus metoprolol  tartrate:. - Metoprolol  to tartrate 12.5 mg twice a day; - Benicar -HCTZ 20-12.5 mg -take 1/2 tab twice daily

## 2024-07-27 NOTE — Assessment & Plan Note (Signed)
 Myalgias with multiple different statins including lovastatin, simvastatin, atorvastatin  and rosuvastatin. Now is on combination of fenofibrate , Repatha  and fish oil with well-controlled lipids.
